# Patient Record
Sex: Female | Born: 1955 | Race: White | Hispanic: No | Smoking: Former smoker
Health system: Southern US, Community
[De-identification: ages and names within clinical notes are randomized; demographics above are authoritative.]

## PROBLEM LIST (undated history)

## (undated) DIAGNOSIS — E785 Hyperlipidemia, unspecified: Secondary | ICD-10-CM

## (undated) DIAGNOSIS — G114 Hereditary spastic paraplegia: Secondary | ICD-10-CM

## (undated) DIAGNOSIS — M1990 Unspecified osteoarthritis, unspecified site: Secondary | ICD-10-CM

## (undated) DIAGNOSIS — E119 Type 2 diabetes mellitus without complications: Secondary | ICD-10-CM

## (undated) DIAGNOSIS — O24430 Gestational diabetes mellitus in the puerperium, diet controlled: Secondary | ICD-10-CM

## (undated) DIAGNOSIS — J439 Emphysema, unspecified: Secondary | ICD-10-CM

## (undated) DIAGNOSIS — K219 Gastro-esophageal reflux disease without esophagitis: Secondary | ICD-10-CM

## (undated) HISTORY — DX: Hyperlipidemia, unspecified: E78.5

## (undated) HISTORY — PX: INNER EAR SURGERY: SHX679

## (undated) HISTORY — PX: SHOULDER SURGERY: SHX246

---

## 2011-07-13 ENCOUNTER — Ambulatory Visit: Payer: Self-pay | Admitting: Internal Medicine

## 2012-05-16 ENCOUNTER — Ambulatory Visit: Payer: Self-pay | Admitting: Emergency Medicine

## 2012-05-28 ENCOUNTER — Ambulatory Visit: Payer: Self-pay | Admitting: Internal Medicine

## 2012-06-30 ENCOUNTER — Ambulatory Visit: Payer: Self-pay | Admitting: Internal Medicine

## 2012-11-23 ENCOUNTER — Emergency Department (HOSPITAL_COMMUNITY)
Admission: EM | Admit: 2012-11-23 | Discharge: 2012-11-23 | Disposition: A | Discharge status: Home / Self Care | Payer: Medicare Other | Attending: Emergency Medicine | Admitting: Emergency Medicine

## 2012-11-23 ENCOUNTER — Emergency Department (HOSPITAL_COMMUNITY): Payer: Medicare Other

## 2012-11-23 ENCOUNTER — Encounter (HOSPITAL_COMMUNITY): Payer: Self-pay | Admitting: *Deleted

## 2012-11-23 DIAGNOSIS — I10 Essential (primary) hypertension: Secondary | ICD-10-CM | POA: Insufficient documentation

## 2012-11-23 DIAGNOSIS — E119 Type 2 diabetes mellitus without complications: Secondary | ICD-10-CM | POA: Insufficient documentation

## 2012-11-23 DIAGNOSIS — Z79899 Other long term (current) drug therapy: Secondary | ICD-10-CM | POA: Insufficient documentation

## 2012-11-23 DIAGNOSIS — R296 Repeated falls: Secondary | ICD-10-CM | POA: Insufficient documentation

## 2012-11-23 DIAGNOSIS — S20212A Contusion of left front wall of thorax, initial encounter: Secondary | ICD-10-CM

## 2012-11-23 DIAGNOSIS — S20219A Contusion of unspecified front wall of thorax, initial encounter: Secondary | ICD-10-CM | POA: Insufficient documentation

## 2012-11-23 DIAGNOSIS — Y929 Unspecified place or not applicable: Secondary | ICD-10-CM | POA: Insufficient documentation

## 2012-11-23 DIAGNOSIS — Y9389 Activity, other specified: Secondary | ICD-10-CM | POA: Insufficient documentation

## 2012-11-23 DIAGNOSIS — F17200 Nicotine dependence, unspecified, uncomplicated: Secondary | ICD-10-CM | POA: Insufficient documentation

## 2012-11-23 HISTORY — DX: Gestational diabetes mellitus in the puerperium, diet controlled: O24.430

## 2012-11-23 HISTORY — DX: Type 2 diabetes mellitus without complications: E11.9

## 2012-11-23 MED ORDER — NAPROXEN 500 MG PO TABS: 500.0000 mg | ORAL_TABLET | Freq: Two times a day (BID) | ORAL | Status: DC

## 2012-11-23 MED ORDER — OXYCODONE-ACETAMINOPHEN 5-325 MG PO TABS: 1.0000 | ORAL_TABLET | Freq: Four times a day (QID) | ORAL | Status: DC | PRN

## 2012-11-23 MED ORDER — IBUPROFEN 200 MG PO TABS
600.0000 mg | ORAL_TABLET | Freq: Once | ORAL | Status: AC
Start: 2012-11-23 — End: 2012-11-23
  Administered 2012-11-23: 600 mg via ORAL
  Filled 2012-11-23: qty 3

## 2012-11-23 NOTE — ED Notes (Signed)
PT reports falling while bringing husband to ED. Pt reports pain to the Lt rib region.

## 2012-11-23 NOTE — ED Provider Notes (Signed)
Medical screening examination/treatment/procedure(s) were performed by non-physician practitioner and as supervising physician I was immediately available for consultation/collaboration.  Avir Deruiter L Charniece Venturino, MD 11/23/12 1709 

## 2012-11-23 NOTE — ED Provider Notes (Signed)
CSN: [id number]     Arrival date & time 11/23/12  0805 History   First MD Initiated Contact with Patient 11/23/12 0813     Chief Complaint  Patient presents with  . Rib Injury   (Consider location/radiation/quality/duration/timing/severity/associated sxs/prior Treatment) HPI Comments: Patient presents with complaint of left anterior rib pain after a fall this morning. Patient states that she fell onto her left forearm and left chest. She currently only has pain in her anterior ribs. No chest pain or shortness of breath. Patient denies head injury or neck pain. No treatments prior to arrival. Onset of symptoms acute. Course is constant. Movement makes the pain worse. Nothing makes it better.  The history is provided by the patient.    Past Medical History  Diagnosis Date  . Hypertension   . Diabetes mellitus without complication   . Diet controlled gestational diabetes mellitus in puerperium    Past Surgical History  Procedure Laterality Date  . Inner ear surgery Bilateral   . Shoulder surgery Bilateral     rotator cuff   No family history on file. History  Substance Use Topics  . Smoking status: Current Every Day Smoker -- 2.00 packs/day    Types: Cigarettes  . Smokeless tobacco: Never Used  . Alcohol Use: No   OB History   Grav Para Term Preterm Abortions TAB SAB Ect Mult Living                 Review of Systems  Constitutional: Negative for fever.  HENT: Negative for neck pain.   Respiratory: Negative for cough and shortness of breath.   Cardiovascular: Positive for chest pain (chest wall pain).  Gastrointestinal: Negative for abdominal pain.  Musculoskeletal: Positive for myalgias. Negative for arthralgias.  Skin: Negative for wound.  Neurological: Negative for headaches.    Allergies  Review of patient's allergies indicates no known allergies.  Home Medications   Current Outpatient Rx  Name  Route  Sig  Dispense  Refill  . ADVAIR DISKUS 250-50 MCG/DOSE  AEPB   Inhalation   Inhale 1 puff into the lungs daily.          Marland Kitchen albuterol (PROVENTIL HFA;VENTOLIN HFA) 108 (90 BASE) MCG/ACT inhaler   Inhalation   Inhale 2 puffs into the lungs every 6 (six) hours as needed for wheezing.         . gabapentin (NEURONTIN) 100 MG capsule   Oral   Take 100 mg by mouth daily.         . Multiple Vitamin (MULTIVITAMIN WITH MINERALS) TABS tablet   Oral   Take 1 tablet by mouth daily.         Marland Kitchen omeprazole (PRILOSEC) 20 MG capsule   Oral   Take 20 mg by mouth daily.         Marland Kitchen oxyCODONE-acetaminophen (PERCOCET) 7.5-325 MG per tablet   Oral   Take 1 tablet by mouth every 4 (four) hours as needed for pain.         Marland Kitchen tiZANidine (ZANAFLEX) 4 MG tablet   Oral   Take 4 mg by mouth every 6 (six) hours as needed.           BP 142/75  Pulse 70  Temp(Src) 98 F (36.7 C) (Oral)  Resp 22  SpO2 96% Physical Exam  Nursing note and vitals reviewed. Constitutional: She appears well-developed and well-nourished.  HENT:  Head: Normocephalic and atraumatic.  Eyes: Conjunctivae are normal. Right eye exhibits no discharge. Left eye  exhibits no discharge.  Neck: Normal range of motion. Neck supple.  Cardiovascular: Normal rate, regular rhythm and normal heart sounds.   Pulmonary/Chest: Effort normal and breath sounds normal. She exhibits tenderness and bony tenderness. She exhibits no mass, no laceration, no crepitus, no edema and no swelling.    Abdominal: Soft. There is no tenderness.  Musculoskeletal:       Right elbow: Normal.      Right wrist: Normal.       Cervical back: Normal.       Thoracic back: Normal.       Right forearm: Normal.       Right hand: Normal.  Neurological: She is alert.  Skin: Skin is warm and dry.  Psychiatric: She has a normal mood and affect.    ED Course  Procedures (including critical care time) Labs Review Labs Reviewed - No data to display Imaging Review No results found.  8:44 AM Patient seen and  examined. Work-up initiated.   Vital signs reviewed and are as follows: Filed Vitals:   11/23/12 0819  BP: 142/75  Pulse: 70  Temp: 98 F (36.7 C)  Resp: 22   9:21 AM chest x-ray is negative. Patient informed. Patient states that she has 6 Percocet left at home because her last prescription was filled August. This is consistent with the substance reporting database. Will prescribe a small amount pain medicine.  Pt counseled to take 10 deep breaths every hour while awake.    Patient counseled on use of narcotic pain medications. Counseled not to combine these medications with others containing tylenol. Urged not to drink alcohol, drive, or perform any other activities that requires focus while taking these medications. The patient verbalizes understanding and agrees with the plan.    MDM   1. Rib contusion, left, initial encounter    Fall. Only apparent injury is left anterior rib pain. Chest x-ray does not demonstrate pneumothorax or broken rib. Will treat conservatively. Patient appears well. No respiratory distress.    Renne Crigler, PA-C 11/23/12 [phone]

## 2013-01-01 ENCOUNTER — Ambulatory Visit: Payer: Self-pay | Admitting: Internal Medicine

## 2013-03-22 HISTORY — PX: HIP FRACTURE SURGERY: SHX118

## 2013-03-29 ENCOUNTER — Inpatient Hospital Stay: Payer: Self-pay | Admitting: Orthopedic Surgery

## 2013-03-29 LAB — BASIC METABOLIC PANEL
ANION GAP: 5 — AB (ref 7–16)
BUN: 7 mg/dL (ref 7–18)
CO2: 25 mmol/L (ref 21–32)
CREATININE: 0.31 mg/dL — AB (ref 0.60–1.30)
Calcium, Total: 8.9 mg/dL (ref 8.5–10.1)
Chloride: 108 mmol/L — ABNORMAL HIGH (ref 98–107)
EGFR (African American): >60
Glucose: 142 mg/dL — ABNORMAL HIGH (ref 65–99)
Osmolality: 276 (ref 275–301)
POTASSIUM: 4.4 mmol/L (ref 3.5–5.1)
SODIUM: 138 mmol/L (ref 136–145)

## 2013-03-29 LAB — URINALYSIS, COMPLETE
Bilirubin,UR: NEGATIVE
Blood: NEGATIVE
GLUCOSE, UR: NEGATIVE mg/dL (ref 0–75)
Ketone: NEGATIVE
Leukocyte Esterase: NEGATIVE
Nitrite: NEGATIVE
Ph: 6 (ref 4.5–8.0)
Protein: NEGATIVE
RBC, UR: NONE SEEN /HPF (ref 0–5)
SPECIFIC GRAVITY: 1.004 (ref 1.003–1.030)

## 2013-03-29 LAB — CBC
HCT: 42.6 % (ref 35.0–47.0)
HGB: 14.5 g/dL (ref 12.0–16.0)
MCH: 32.3 pg (ref 26.0–34.0)
MCHC: 34.1 g/dL (ref 32.0–36.0)
MCV: 95 fL (ref 80–100)
PLATELETS: 209 10*3/uL (ref 150–440)
RBC: 4.49 10*6/uL (ref 3.80–5.20)
RDW: 12.9 % (ref 11.5–14.5)
WBC: 23.3 10*3/uL — AB (ref 3.6–11.0)

## 2013-03-29 LAB — APTT: Activated PTT: 31.1 secs (ref 23.6–35.9)

## 2013-03-29 LAB — PROTIME-INR
INR: 1
Prothrombin Time: 12.7 secs (ref 11.5–14.7)

## 2013-03-30 LAB — CBC WITH DIFFERENTIAL/PLATELET
BASOS PCT: 0.5 %
Basophil #: 0 10*3/uL (ref 0.0–0.1)
Eosinophil #: 0.2 10*3/uL (ref 0.0–0.7)
Eosinophil %: 2.2 %
HCT: 38.4 % (ref 35.0–47.0)
HGB: 13 g/dL (ref 12.0–16.0)
LYMPHS ABS: 3.3 10*3/uL (ref 1.0–3.6)
Lymphocyte %: 34.1 %
MCH: 32.4 pg (ref 26.0–34.0)
MCHC: 33.8 g/dL (ref 32.0–36.0)
MCV: 96 fL (ref 80–100)
MONO ABS: 0.7 x10 3/mm (ref 0.2–0.9)
MONOS PCT: 7.7 %
NEUTROS PCT: 55.5 %
Neutrophil #: 5.3 10*3/uL (ref 1.4–6.5)
Platelet: 168 10*3/uL (ref 150–440)
RBC: 4 10*6/uL (ref 3.80–5.20)
RDW: 12.9 % (ref 11.5–14.5)
WBC: 9.6 10*3/uL (ref 3.6–11.0)

## 2013-03-30 LAB — BASIC METABOLIC PANEL
ANION GAP: 4 — AB (ref 7–16)
BUN: 6 mg/dL — ABNORMAL LOW (ref 7–18)
CHLORIDE: 108 mmol/L — AB (ref 98–107)
CO2: 27 mmol/L (ref 21–32)
CREATININE: 0.82 mg/dL (ref 0.60–1.30)
Calcium, Total: 8.3 mg/dL — ABNORMAL LOW (ref 8.5–10.1)
EGFR (African American): >60
EGFR (Non-African Amer.): >60
Glucose: 216 mg/dL — ABNORMAL HIGH (ref 65–99)
OSMOLALITY: 282 (ref 275–301)
Potassium: 3.7 mmol/L (ref 3.5–5.1)
SODIUM: 139 mmol/L (ref 136–145)

## 2013-03-30 LAB — HEMOGLOBIN A1C: HEMOGLOBIN A1C: 7.6 % — AB (ref 4.2–6.3)

## 2013-03-31 LAB — CBC WITH DIFFERENTIAL/PLATELET
Basophil #: 0.1 10*3/uL (ref 0.0–0.1)
Basophil %: 0.9 %
Eosinophil #: 0.2 10*3/uL (ref 0.0–0.7)
Eosinophil %: 1.3 %
HCT: 36.4 % (ref 35.0–47.0)
HGB: 12.4 g/dL (ref 12.0–16.0)
LYMPHS PCT: 19.2 %
Lymphocyte #: 2.7 10*3/uL (ref 1.0–3.6)
MCH: 32.8 pg (ref 26.0–34.0)
MCHC: 34.2 g/dL (ref 32.0–36.0)
MCV: 96 fL (ref 80–100)
MONO ABS: 1.1 x10 3/mm — AB (ref 0.2–0.9)
Monocyte %: 8.2 %
NEUTROS PCT: 70.4 %
Neutrophil #: 9.8 10*3/uL — ABNORMAL HIGH (ref 1.4–6.5)
PLATELETS: 148 10*3/uL — AB (ref 150–440)
RBC: 3.79 10*6/uL — ABNORMAL LOW (ref 3.80–5.20)
RDW: 12.8 % (ref 11.5–14.5)
WBC: 13.9 10*3/uL — ABNORMAL HIGH (ref 3.6–11.0)

## 2013-03-31 LAB — BASIC METABOLIC PANEL
ANION GAP: 3 — AB (ref 7–16)
BUN: 5 mg/dL — ABNORMAL LOW (ref 7–18)
Calcium, Total: 8.4 mg/dL — ABNORMAL LOW (ref 8.5–10.1)
Chloride: 105 mmol/L (ref 98–107)
Co2: 27 mmol/L (ref 21–32)
Creatinine: 0.62 mg/dL (ref 0.60–1.30)
EGFR (African American): >60
Glucose: 151 mg/dL — ABNORMAL HIGH (ref 65–99)
OSMOLALITY: 270 (ref 275–301)
Potassium: 3.9 mmol/L (ref 3.5–5.1)
Sodium: 135 mmol/L — ABNORMAL LOW (ref 136–145)

## 2013-04-01 LAB — HEMOGLOBIN: HGB: 12.4 g/dL (ref 12.0–16.0)

## 2013-04-11 ENCOUNTER — Other Ambulatory Visit: Payer: Self-pay | Admitting: Family Medicine

## 2013-04-11 LAB — URINALYSIS, COMPLETE
BILIRUBIN, UR: NEGATIVE
GLUCOSE, UR: NEGATIVE mg/dL (ref 0–75)
Ketone: NEGATIVE
Nitrite: NEGATIVE
Ph: 7 (ref 4.5–8.0)
Protein: 30
Specific Gravity: 1.01 (ref 1.003–1.030)

## 2013-04-13 LAB — URINE CULTURE

## 2013-05-15 DIAGNOSIS — G114 Hereditary spastic paraplegia: Secondary | ICD-10-CM | POA: Insufficient documentation

## 2013-07-02 ENCOUNTER — Ambulatory Visit: Payer: Self-pay | Admitting: Internal Medicine

## 2013-09-28 DIAGNOSIS — M159 Polyosteoarthritis, unspecified: Secondary | ICD-10-CM | POA: Insufficient documentation

## 2014-05-10 ENCOUNTER — Ambulatory Visit: Payer: Self-pay | Admitting: Anesthesiology

## 2014-05-10 DIAGNOSIS — I10 Essential (primary) hypertension: Secondary | ICD-10-CM

## 2014-05-11 ENCOUNTER — Ambulatory Visit: Payer: Medicare HMO | Admitting: Orthopedic Surgery

## 2014-06-12 NOTE — Consult Note (Signed)
Chief Complaint:  Subjective/Chief Complaint Pt with right hip fx. Hx of COPD, GERD, chronic pain. No cardiac hx. Denies CP, palpitations, or syncope.   Brief Assessment:  GEN no acute distress   Cardiac Regular   Respiratory clear BS   Gastrointestinal details normal Soft  Nontender  Bowel sounds normal   EXTR negative edema   Lab Results: Routine Chem:  08-Feb-15 15:59   Glucose, Serum  142  BUN 7  Creatinine (comp)  0.31  Sodium, Serum 138  Potassium, Serum 4.4  Chloride, Serum  108  CO2, Serum 25  Anion Gap  5  eGFR (Non-African American) >60 (eGFR values <66m/min/1.73 m2 may be an indication of chronic kidney disease (CKD). Calculated eGFR is useful in patients with stable renal function. The eGFR calculation will not be reliable in acutely ill patients when serum creatinine is changing rapidly. It is not useful in  patients on dialysis. The eGFR calculation may not be applicable to patients at the low and high extremes of body sizes, pregnant women, and vegetarians.)  Routine Hem:  08-Feb-15 15:59   WBC (CBC)  23.3  Hemoglobin (CBC) 14.5  Platelet Count (CBC) 209 (Result(s) reported on 29 Mar 2013 at 04:16PM.)   Radiology Results: XRay:    08-Feb-15 16:24, Chest 1 View AP or PA  Chest 1 View AP or PA   REASON FOR EXAM:    FALL, PAIN  COMMENTS:       PROCEDURE: DXR - DXR CHEST 1 VIEWAP OR PA  - Mar 29 2013  4:24PM     CLINICAL DATA:  Fall today with hip pain.    EXAM:  CHEST - 1 VIEW    COMPARISON:  None.    FINDINGS:  The heart size and mediastinalcontours are normal. The lungs are  clear. There is no pleural effusion or pneumothorax. No fractures  are identified. There are postsurgical changes within the right  humeral head, distal right clavicle and left scapular glenoid.     IMPRESSION:  Noactive cardiopulmonary process.      Electronically Signed    By: BCamie PatienceM.D.    On: 03/29/2013 16:26         Verified By: WVivia Ewing  M.D.,    08-Feb-15 16:24, Hip Right Complete  Hip Right Complete   REASON FOR EXAM:    Fall, Pain, limited ROM  COMMENTS:       PROCEDURE: DXR - DXR HIP RIGHT COMPLETE  - Mar 29 2013  4:24PM     CLINICAL DATA:  Fall today with right hip pain and limited range of  motion.    EXAM:  RIGHT HIP - COMPLETE 2+ VIEW    COMPARISON:  None.    FINDINGS:  The bones appear mildly demineralized. There is a nondisplaced  fracture of the proximal right femur involving the intertrochanteric  region and proximal diaphysis. There is underlying deformity of the  right femoral neckwith mild asymmetric right hip osteoarthritis. No  pelvic fractures are identified. Postsurgical changes are present  within the superior pubic rami bilaterally.     IMPRESSION:  Nondisplaced right intertrochanteric femur fracture with  subtrochanteric extension. Underlying deformity of the right femoral  neck.      Electronically Signed    By: BCamie PatienceM.D.    On: 03/29/2013 16:25     Verified By: WVivia Ewing M.D.,   Assessment/Plan:  Assessment/Plan:  Plan EKG normal. Cleared for surgery. Will follow.   Electronic  Signatures: Idelle Crouch (MD)  (Signed 08-Feb-15 16:49)  Authored: Chief Complaint, Brief Assessment, Lab Results, Radiology Results, Assessment/Plan   Last Updated: 08-Feb-15 16:49 by Idelle Crouch (MD)

## 2014-06-12 NOTE — Discharge Summary (Signed)
Dates of Admission and Diagnosis:  Date of Admission 29-Mar-2013   Date of Discharge 03-Apr-2013   Admitting Diagnosis intertrochanteric hip fracture   Final Diagnosis same   Allergies:  No Known Allergies:   Hospital Course:  Hospital Course Patient status unchanged since discharge summary of 2/11.  Tolerated PT  and diet well. No change in medication. Stable for transfer to rehab facility.   DISCHARGE INSTRUCTIONS HOME MEDS:  Medication Reconciliation: Patient's Home Medications at Discharge:     Medication Instructions  gabapentin 100 mg oral capsule   orally once a day   omeprazole 10 mg oral delayed release capsule  1 cap(s) orally once a day   advair diskus 250 mcg-50 mcg inhalation powder  1 puff(s) inhaled 2 times a day   acetaminophen 325 mg oral tablet  2 tab(s) orally every 4 hours, As needed, pain or temp. greater than 100.4   lovenox 40 mg/0.4 ml injectable solution   injectable once a day   ferrous sulfate 325 mg (65 mg elemental iron) oral tablet  1 tab(s) orally 2 times a day (with meals)   bisacodyl 10 mg rectal suppository  1 suppository(ies) rectal once a day (at bedtime), As needed, constipation   docusate calcium 240 mg oral capsule  1 cap(s) orally once a day (at bedtime)   glipizide 5 mg oral tablet  1 tab(s) orally 2 times a day (before meals)    STOP TAKING THE FOLLOWING MEDICATION(S):    oxycodone 5 mg oral tablet: 1 tab(s) orally 2 times a day  Electronic Signatures: Laurene Footman (MD)  (Signed 13-Feb-15 18:45)  Authored: [facility], [facility]   Last Updated: 13-Feb-15 18:45 by Laurene Footman (MD)

## 2014-06-12 NOTE — H&P (Signed)
PATIENT NAME:  Hannah [name], Hannah [name] MR#:  [id number] DATE OF BIRTH:  09/07/1955  DATE OF ADMISSION:  03/29/2013  CHIEF COMPLAINT: Right hip pain.   HISTORY OF PRESENT ILLNESS: The patient is a 59 year old who was going up some steps at home, and her dog pulled on her chain, knocking the patient down. She suffered immediate pain, and was unable to bear weight. She was brought to the ER, where she was found to have a right intertrochanteric hip fracture. She normally does have some problems ambulating secondary to a neurologic disorder that is rare. She normally uses a walker. She does have a past medical history significant for COPD, reflux, and chronic pain.  MEDICATIONS UPON ADMISSION:  Advair Diskus 25/50, 1 puff b.i.d., gabapentin 100 mg daily at night, omeprazole 10 mg daily, oxycodone 5, 1 pill b.i.d.   SOCIAL HISTORY: She is a smoker.    FAMILY HISTORY:  Noncontributory.  REVIEW OF SYSTEMS: Negative for chest pain, loss of consciousness.   PHYSICAL EXAMINATION: GENERAL: A well-developed, well-nourished white female who appears her stated age, in moderate distress.  HEENT:  Remarkable for lower partial denture.  LUNGS: Clear. No wheezing noted.  HEART: Regular rate and rhythm.  ABDOMEN: Soft and nontender. EXTREMITIES: Right lower extremity, she is able to flex and extend the toes, and does have palpable pulses and sensation in the foot. It is difficult to assess rotation, as the patient lying on the left side.   DATA:  X-rays were reviewed with the patient and her family, showing a spiral proximal intertrochanteric fracture with subtrochanteric extension.   CLINICAL IMPRESSION: Right intertrochanteric hip fracture with subtrochanteric extension. Recommended treatment is ORIF with intramedullary device. I reviewed this with the patient. The risks, benefits, possible complications, in particular blood clots and infection, were discussed. She was also seen by the hospitalist service.   PLAN:   ORIF tomorrow afternoon.   ____________________________ Laurene Footman, MD mjm:mr D: 03/29/2013 18:57:48 ET T: 03/29/2013 20:31:17 ET JOB#: [id number]  cc: Laurene Footman, MD, <Dictator> Laurene Footman MD ELECTRONICALLY SIGNED 03/30/2013 8:21

## 2014-06-12 NOTE — Op Note (Signed)
PATIENT NAME:  Hannah [name], Hannah [name] MR#:  [id number] DATE OF BIRTH:  [birth year]  DATE OF PROCEDURE:  03/30/2013  PREOPERATIVE DIAGNOSIS: Right intertrochanteric hip fracture with subtrochanteric extension.   POSTOPERATIVE DIAGNOSIS: Right intertrochanteric hip fracture with subtrochanteric extension.    PROCEDURE: Open reduction and internal fixation, right hip, with intramedullary device.   ANESTHESIA: Spinal.   SURGEON: Laurene Footman, M.D.   DESCRIPTION OF PROCEDURE: The patient was brought to the operating room and after adequate anesthesia was obtained, the patient was placed on the fracture table with the left leg in the well leg holder, right leg in the traction boot. With slight traction and internal rotation applied, the fracture was well aligned. The hip was prepped and draped using the barrier drape method, and appropriate patient identification and timeout procedures were completed. A proximal skin incision was made proximal to the greater trochanter and a guidewire inserted through the center of the greater trochanter. Proximal reaming carried out and the long guidewire inserted. Measurement was made off of this for length. Reaming to 11 mm gave good chatter in the mid canal, so a 9 x 340 mm long Affixus rod was obtained for the right side, 130 degree. This was inserted to the appropriate level and impacted to the appropriate level with a small lateral incision. A guidewire was inserted in a near center/center position. Because of the fracture vertical obliquity, a second guidewire was taken and placed as an antirotation device through the antirotation guide but without the antirotation screw being utilized and left until lag screw inserted. The guidewire measured 90 mm for the lag screw. Drilling was carried out, followed by placement of the 90 mm lag screw with a very firm bite into the femoral head and the superior guidewire to prevent rotation of the fragment. The antirotation guidewire was  removed and the set screw placed. Following this, traction was released, and compression was applied to the fracture site with good compression at the fracture site. Permanent AP and lateral projections were saved after removal of the insertion device. The leg was abducted, and a distal interlocking screw was placed using standard technique, visualizing the slotted screw hole. A small stab incision, spreading the soft tissues, drilling, measuring and placing a 5.0 cortical screw, 38 mm in length. This gave good fixation on both cortices. At this point, the wounds were thoroughly irrigated. The deep fascia repaired using 0 Vicryl, 2-0 Vicryl subcutaneously and skin staples. Xeroform, 4 x 4's, ABD and tape applied. The patient was then sent to the recovery room in stable condition.   ESTIMATED BLOOD LOSS: 50.   COMPLICATIONS: None.   SPECIMEN: None.   IMPLANTS: Biomet Affixus hip fracture nail right, 130 degree, 9 x 340 mm, with a 90 mm lag screw and a 38 mm cortical bone screw.    ____________________________ Laurene Footman, MD mjm:gb D: 03/30/2013 18:32:36 ET T: 03/31/2013 00:44:13 ET JOB#: [id number]  cc: Laurene Footman, MD, <Dictator> Laurene Footman MD ELECTRONICALLY SIGNED 03/31/2013 11:35

## 2014-06-12 NOTE — Consult Note (Signed)
PATIENT NAME:  Hannah [name], Hannah [name] MR#:  [id number] DATE OF BIRTH:  18-Sep-1955  DATE OF CONSULTATION:  03/29/2013  REFERRING PHYSICIAN:  Laurene Footman, MD CONSULTING PHYSICIAN:  Leonie Douglas. Doy Hutching, MD  FAMILY PHYSICIAN: Halina Maidens, MD  REASON FOR CONSULTATION:  Preop medical clearance prior to hip surgery.   HISTORY OF PRESENT ILLNESS: The patient is a 59 year old female with a history of COPD/tobacco abuse, reflux and chronic back pain with a questionable history of spina bifida. Ambulates with assistance on a normal day. Tripped over the dog this morning, falling, injuring her right hip. Was found to have a hip fracture and was admitted by orthopedics for further evaluation. Denies any cardiac history. Denies chest pain, palpitations, presyncope or syncope.   PAST MEDICAL HISTORY: 1.  Chronic back pain with narcotics dependence.  2.  GE reflux disease.  3.  COPD/tobacco abuse.  4.  Questionable history of spina bifida.   MEDICATIONS: 1.  Oxycodone 5 mg p.o. b.i.d.  2.  Prilosec 20 mg p.o. daily.  3.  Neurontin 100 mg p.o. daily.  4.  Advair 250/50 one puff b.i.d.   ALLERGIES: No known drug allergies.   SOCIAL HISTORY: The patient smokes less than 1 pack per day. Denies alcohol abuse.   FAMILY HISTORY: Positive for hypertension but negative for colon or breast cancer. Negative for coronary artery disease.   REVIEW OF SYSTEMS: CONSTITUTIONAL: No fever or change in weight.  EYES: No blurred or double vision. No glaucoma.  ENT: No tinnitus or hearing loss. No nasal discharge or bleeding. No difficulty swallowing.  RESPIRATORY: No cough or wheezing. Denies hemoptysis.  CARDIOVASCULAR: No chest pain or orthopnea. No palpitations or syncope.  GASTROINTESTINAL: No nausea, vomiting or diarrhea. No abdominal pain or change in bowel habits.  GENITOURINARY: No dysuria or hematuria. No incontinence.  ENDOCRINE: No polyuria or polydipsia. No heat or cold intolerance.  HEMATOLOGIC: The  patient denies anemia, easy bruising or bleeding.  LYMPHATIC: No swollen glands.  MUSCULOSKELETAL: The patient denies pain in her neck, shoulders or knees. Does have back and hip pain. No gout.  NEUROLOGIC: No numbness or migraines. Denies stroke or seizures.  PSYCHIATRIC: The patient denies anxiety, insomnia or depression.   PHYSICAL EXAMINATION: GENERAL: The patient is acutely ill-appearing due to her hip pain.  VITAL SIGNS: Remarkable for a blood pressure of 116/72, heart rate 61, respiratory rate of 16, temperature of 98. Sats 94% on room air.  HEENT: Normocephalic, atraumatic. Pupils equally round and reactive to light and accommodation. Extraocular movements are intact. Sclerae anicteric. Conjunctivae are clear. Oropharynx clear. NECK: Supple without JVD. No adenopathy or thyromegaly is noted.  LUNGS: Clear to auscultation and percussion without wheezes, rales or rhonchi. No dullness. Respiratory effort is normal.  CARDIAC: Regular rate and rhythm. Normal S1, S2. No significant rubs, murmurs or gallops. PMI is nondisplaced. Chest wall is nontender.  ABDOMEN: Soft, nontender, with normoactive bowel sounds. No organomegaly or masses were appreciated. No hernias or bruits were noted.  EXTREMITIES: Without clubbing, cyanosis or edema. Pulses were 2+ bilaterally.  SKIN: Warm and dry without rash or lesions.  NEUROLOGIC: Cranial nerves II through XII grossly intact. Deep tendon reflexes were symmetric. Motor and sensory examination was nonfocal.  PSYCHIATRIC: Revealed a patient who was alert and oriented to person, place and time. She was cooperative and used good judgment.   LABORATORY, DIAGNOSTIC AND RADIOLOGICAL DATA:  EKG revealed sinus rhythm with no acute ischemic changes. Chest x-ray showed no acute cardiopulmonary disease. White  count was 23.3 with a hemoglobin of 14.5. Glucose 142 with a BUN of 7, creatinine of 0.31 with a sodium of 138 and a potassium of 4.4. GFR was greater than 60.    ASSESSMENT: 1.  Acute right hip fracture.  2.  Chronic obstructive pulmonary disease.  3.  Gastroesophageal reflux disease. 4.  Chronic back pain.  5.  Hyperglycemia.   PLAN: The patient appears medically stable and is cleared for orthopedic surgery. We will optimize her pulmonary regimen. She defers a nicotine patch. We will follow her sugars with Accu-Cheks before meals and at bedtime and add sliding scale insulin as needed. Begin IV fluids now. Follow up routine labs in the morning.   Thank you for the consultation. Will continue to follow this patient with you while in the hospital. Please call if questions arise.   ____________________________ Leonie Douglas. Doy Hutching, MD jds:cs D: 03/29/2013 16:59:15 ET T: 03/29/2013 20:55:21 ET JOB#: [id number]  cc: Leonie Douglas. Doy Hutching, MD, <Dictator> Lache Dagher Lennice Sites MD ELECTRONICALLY SIGNED 03/30/2013 7:48

## 2014-06-12 NOTE — Discharge Summary (Signed)
PATIENT NAME:  Hannah [name], BOYSON MR#:  [id number] DATE OF BIRTH:  1955/06/06  DATE OF ADMISSION:  03/29/2013 DATE OF DISCHARGE:  04/02/2013  ADMITTING DIAGNOSIS: Right intertrochanteric hip fracture with subtrochanteric extension.   POSTOPERATIVE DIAGNOSIS: Right intertrochanteric fracture with subtrochanteric extension.   PROCEDURE: Open reduction and internal fixation of the right hip with intramedullary device.   ANESTHESIA: Spinal.   SURGEON: Laurene Footman, M.D.   ESTIMATED BLOOD LOSS: 50 mL.   COMPLICATIONS: None.   SPECIMENS: None.   IMPLANTS: [employer] Affixus hip fracture nail of right, 130 degrees, 9 x 340 mm, with a 90 mm lag screw and a 38 mm cortical bone screw.   HISTORY: The patient is a 59 year old female, who was going up some steps at home and her dog pulled her on a chain knocking the patient down and she suffered immediate pain and was unable to bear weight. She was brought to the ER where she was found to have a right intertrochanteric hip fracture. She normally does have some problems with ambulating secondary to a neurologic disorder that rare. She normally uses a walker. She does have a past medical history significant for chronic obstructive pulmonary disease, reflux and chronic pain.   PHYSICAL EXAMINATION:  GENERAL: Well-developed, well-nourished, white female, who appears her stated age, in moderate distress.  HEENT: Remarkable for lower partial dentures.  LUNGS: Clear with no wheezing noted.  HEART: Regular rate and rhythm.  ABDOMEN: Soft, nontender.  EXTREMITIES: Right lower extremity. She is able to flex and extend the toes and does have palpable pulses and sensation in the foot. It is difficult to assess rotation as the patient is lying on the left side.   HOSPITAL COURSE: The patient was admitted to the hospital in February 2015 where she was evaluated by medicine service and she was cleared for surgery. On 03/30/2013, she had surgery that day or open  reduction internal fixation of right hip with intramedullary device. She was brought to the orthopedic floor from the PACU in stable condition. The patient was in moderate pain throughout the first 2 days. She had slow progress with physical therapy. It was determined that she would go to rehab for continuation of physical therapy and occupational therapy service. Her vital signs and labs were stable. She had a bowel movement, and by postoperative day 3, April 02, 2013, she was ready for discharge to rehab.   DISCHARGE INSTRUCTIONS: The patient may gradually increase weight-bearing on the effected extremity. She is to elevate the affected foot or leg on 1 or 2 pillows with the foot higher than the knee. Knee-high TED hose on both legs and remove at bedtime, replace on arising the next morning. Elevate the heels off the bed. Use incentive spirometry every hour while awake. She may resume a regular diet as tolerated. Do not get the dressing or bandage wet or dirty. Call [facility] ortho if the dressing gets water under. Leave the dressing on. Call [facility] ortho if any bright red bleeding from the incision wound, fever above 101.5 degrees, redness, swelling, or drainage at the incision. Call [facility] ortho if you experience any increased leg pain, numbness or weakness in legs or bowel or bladder symptoms. Rehab has been arranged for continuation of rehab program. Please call [place] [facility] ortho if a therapist has not contacted you within 48 hours of return home. Call [facility] ortho for followup appointment.   DISCHARGE MEDICATIONS: Please see [facility] ortho discharge instructions for her discharge medications.  ____________________________ T. Gerald Stabs  Arvella Nigh, PA-C tcg:aw D: 04/02/2013 08:01:09 ET T: 04/02/2013 08:14:17 ET JOB#: [id number]  cc: T. Rachelle Hora, PA-C, <Dictator> Duanne Guess [place] ELECTRONICALLY SIGNED 04/02/2013 11:23

## 2014-06-20 NOTE — Op Note (Signed)
PATIENT NAME:  Hannah [name], Hannah [name] MR#:  [id number] DATE OF BIRTH:  Jun 16, 1955  DATE OF PROCEDURE:  05/11/2014  PREOPERATIVE DIAGNOSIS: Painful hardware, right hip.   POSTOPERATIVE DIAGNOSIS: Painful hardware, right hip.   PROCEDURE: Hardware removal, right hip.   ANESTHESIA: General.   SURGEON: Laurene Footman, MD  DESCRIPTION OF PROCEDURE: The patient was brought to the operating room and after adequate anesthesia was obtained, the patient was placed on the fracture table with the right foot in the traction boot without traction applied.  The C-arm was brought in and with internal rotation, the maximum extent of the lag screw could be visualized. The hip was prepped and draped using the barrier drape method and prior incisions were utilized. After patient identification and timeout procedure were complete, proximal incision was carried out and the soft tissues spread.  The top of the rod was exposed percutaneously and the screwdriver inserted with some difficulty with soft tissues but using the insertion guide, it helped align the proximal screw. The screw could be loosened going laterally.   The prior lateral incision was made and a guidewire inserted up into the head and the prior screw removed without difficulty.   The prior screw was 90 and was somewhat prominent so a 75 mm lag screw was reinserted to prevent neck fracture. This was tightened down until it was stable.   All instruments for insertion were removed with the new lag screw locked in place. The wounds were irrigated and closed with 2-0 Vicryl subcutaneously and skin staples. Xeroform, 4 x 4's, ABD, and tape applied.   ESTIMATED BLOOD LOSS:  25.   COMPLICATIONS: None.   SPECIMEN: None.   Prior hardware was discarded.     ____________________________ Laurene Footman, MD mjm:tr D: 05/11/2014 17:12:00 ET T: 05/11/2014 17:49:12 ET JOB#: [id number]  cc: Laurene Footman, MD, <Dictator> Laurene Footman MD ELECTRONICALLY SIGNED  05/12/2014 8:08

## 2014-11-11 ENCOUNTER — Other Ambulatory Visit: Payer: Self-pay | Admitting: Internal Medicine

## 2014-11-11 ENCOUNTER — Encounter: Payer: Self-pay | Admitting: Internal Medicine

## 2014-11-11 DIAGNOSIS — R1310 Dysphagia, unspecified: Secondary | ICD-10-CM | POA: Insufficient documentation

## 2014-11-11 DIAGNOSIS — J439 Emphysema, unspecified: Secondary | ICD-10-CM | POA: Insufficient documentation

## 2014-11-11 DIAGNOSIS — F17201 Nicotine dependence, unspecified, in remission: Secondary | ICD-10-CM | POA: Insufficient documentation

## 2014-11-11 DIAGNOSIS — E118 Type 2 diabetes mellitus with unspecified complications: Secondary | ICD-10-CM | POA: Insufficient documentation

## 2014-11-11 DIAGNOSIS — G114 Hereditary spastic paraplegia: Secondary | ICD-10-CM | POA: Insufficient documentation

## 2014-11-15 ENCOUNTER — Other Ambulatory Visit: Payer: Self-pay | Admitting: Internal Medicine

## 2014-11-23 DIAGNOSIS — R69 Illness, unspecified: Secondary | ICD-10-CM | POA: Diagnosis not present

## 2014-11-24 ENCOUNTER — Encounter: Payer: Self-pay | Admitting: Internal Medicine

## 2014-11-24 ENCOUNTER — Ambulatory Visit (INDEPENDENT_AMBULATORY_CARE_PROVIDER_SITE_OTHER): Payer: Medicare HMO | Admitting: Internal Medicine

## 2014-11-24 VITALS — BP 120/60 | HR 64 | Ht 65.0 in | Wt 174.0 lb

## 2014-11-24 DIAGNOSIS — Z0000 Encounter for general adult medical examination without abnormal findings: Secondary | ICD-10-CM | POA: Diagnosis not present

## 2014-11-24 DIAGNOSIS — Z1239 Encounter for other screening for malignant neoplasm of breast: Secondary | ICD-10-CM | POA: Diagnosis not present

## 2014-11-24 DIAGNOSIS — E1100 Type 2 diabetes mellitus with hyperosmolarity without nonketotic hyperglycemic-hyperosmolar coma (NKHHC): Secondary | ICD-10-CM | POA: Diagnosis not present

## 2014-11-24 DIAGNOSIS — F17201 Nicotine dependence, unspecified, in remission: Secondary | ICD-10-CM

## 2014-11-24 DIAGNOSIS — G114 Hereditary spastic paraplegia: Secondary | ICD-10-CM

## 2014-11-24 DIAGNOSIS — Z720 Tobacco use: Secondary | ICD-10-CM | POA: Diagnosis not present

## 2014-11-24 DIAGNOSIS — J439 Emphysema, unspecified: Secondary | ICD-10-CM

## 2014-11-24 DIAGNOSIS — Z23 Encounter for immunization: Secondary | ICD-10-CM | POA: Diagnosis not present

## 2014-11-24 LAB — POCT URINALYSIS DIPSTICK
Bilirubin, UA: NEGATIVE
Blood, UA: NEGATIVE
Glucose, UA: NEGATIVE
Ketones, UA: NEGATIVE
LEUKOCYTES UA: NEGATIVE
Nitrite, UA: NEGATIVE
PH UA: 5
PROTEIN UA: NEGATIVE
Spec Grav, UA: 1.01
Urobilinogen, UA: 0.2

## 2014-11-24 NOTE — Progress Notes (Signed)
Patient: Hannah [name], Female    DOB: [birth year], 59 y.o.   MRN: [id number] Visit Date: 11/24/2014  Today's Provider: Halina Maidens, MD   Chief Complaint  Patient presents with  . Medicare Wellness   Subjective:    Annual wellness visit Hannah [name] is a 59 y.o. female who presents today for her Subsequent Annual Wellness Visit. She feels fairly well. She reports exercising none. She reports she is sleeping fairly well.   ----------------------------------------------------------- Diabetes She presents for her follow-up diabetic visit. She has type 2 diabetes mellitus. Her disease course has been fluctuating. Hypoglycemia symptoms include tremors (spasticity). Pertinent negatives for hypoglycemia include no seizures or speech difficulty. Associated symptoms include weakness. Pertinent negatives for diabetes include no chest pain, no polyphagia and no polyuria. Pertinent negatives for diabetic complications include no autonomic neuropathy, nephropathy or peripheral neuropathy. Current diabetic treatment includes oral agent (monotherapy). She is compliant with treatment most of the time. There is no change (Although she recently started drinking cinnamon smoothly and has had some decrease in blood sugar trend) in her home blood glucose trend.   COPD -patient has remained tobacco free. She has mild COPD symptoms which she takes Advair daily. She uses albuterol rescue inhaler intermittently. She denies chronic cough wheezing fever chills or change and endurance. 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 Spastic paraparesis she is [facility]. She remains on muscle relaxers and narcotic pain medications. She states that her symptom course is stable. She walks with a walker. She has frequent falls but no recent injury. She did sustain a hip fracture February 2015.   Review of Systems  Constitutional: Negative for fever, activity  change, appetite change and unexpected weight change.  HENT: Negative for hearing loss, sinus pressure, sore throat and trouble swallowing.   Eyes: Negative for visual disturbance.  Respiratory: Positive for shortness of breath. Negative for cough, choking and wheezing.   Cardiovascular: Negative for chest pain, palpitations and leg swelling.  Gastrointestinal: Negative for nausea, abdominal pain, diarrhea and blood in stool.  Endocrine: Negative for polyphagia and polyuria.  Genitourinary: Negative for dysuria, hematuria, flank pain and pelvic pain.  Musculoskeletal: Positive for myalgias, arthralgias and gait problem.  Neurological: Positive for tremors (spasticity) and weakness. Negative for seizures, speech difficulty and numbness.  Hematological: Negative for adenopathy.  Psychiatric/Behavioral: Negative for suicidal ideas, sleep disturbance, dysphoric mood and decreased concentration.    Social History   Social History  . Marital Status: Married    Spouse Name: N/A  . Number of Children: N/A  . Years of Education: N/A   Occupational History  . Not on file.   Social History Main Topics  . Smoking status: Former Smoker -- 2.00 packs/day    Types: Cigarettes  . Smokeless tobacco: Never Used  . Alcohol Use: 1.2 oz/week    2 Standard drinks or equivalent per week  . Drug Use: No  . Sexual Activity: No   Other Topics Concern  . Not on file   Social History Narrative    Patient Active Problem List   Diagnosis Date Noted  . Dysphagia 11/11/2014  . 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 Diabetes mellitus type 2, uncontrolled ([place]) 11/11/2014  . Chronic obstructive pulmonary emphysema ([place]) 11/11/2014  . Tobacco use disorder, mild, 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  . Degenerative joint disease involving multiple joints 09/28/2013    Past Surgical History  Procedure Laterality Date  . [place]  ear surgery Bilateral   . Shoulder surgery  Bilateral     rotator cuff  . Hip fracture surgery Right 03/2013    Her family history is not on file.    Previous Medications   ALBUTEROL (PROAIR HFA) 108 (90 BASE) MCG/ACT INHALER    Inhale into the lungs.   DENOSUMAB (PROLIA) 60 MG/ML SOLN INJECTION    Inject into the skin.   FLUTICASONE-SALMETEROL (ADVAIR DISKUS) 250-50 MCG/DOSE AEPB    Inhale 1 puff into the lungs 2 (two) times daily.   GABAPENTIN (NEURONTIN) 300 MG CAPSULE    Take 2 capsules by mouth 2 (two) times daily.   GLUCOSE BLOOD (BAYER CONTOUR TEST) TEST STRIP    BAYER CONTOUR TEST (In Vitro Strip)  1 (one) Strip daily for 50 days  Quantity: 50;  Refills: 5   Ordered :[id number]  Halina Maidens M.D.;  Started 22-Jun-2013 Active Comments: dx: 250.02   IBUPROFEN (ADVIL,MOTRIN) 200 MG TABLET    Take by mouth.   METFORMIN (GLUCOPHAGE) 500 MG TABLET    TAKE 1 TABLET BY MOUTH DAILY   MISC NATURAL PRODUCTS (GLUCOSAMINE CHOND COMPLEX/MSM) TABS    Take by mouth.   MULTIPLE VITAMIN (MULTIVITAMIN WITH MINERALS) TABS TABLET    Take 1 tablet by mouth daily.   OMEPRAZOLE (PRILOSEC) 20 MG CAPSULE    Take 20 mg by mouth daily.   OXYCODONE-ACETAMINOPHEN (PERCOCET/ROXICET) 5-325 MG PER TABLET    Take 1-2 tablets by mouth every 6 (six) hours as needed for pain.   TIZANIDINE (ZANAFLEX) 4 MG TABLET    Take 4 mg by mouth every 6 (six) hours as needed.     Patient Care Team: Glean Hess, MD as PCP - General (Family Medicine) Carlos Levering, MD as Referring Physician (Physical Medicine and Rehabilitation)     Objective:   Vitals: BP 120/60 mmHg  Pulse 64  Ht 5\' 5"  (1.651 m)  Wt 174 lb (78.926 kg)  BMI 28.96 kg/m2  Physical Exam  Constitutional: She is oriented to person, place, and time. She appears well-developed and well-nourished. No distress.  HENT:  Head: Normocephalic and atraumatic.  Right Ear: Tympanic membrane and ear canal normal.  Left Ear: Tympanic membrane and ear canal normal.  Nose: Right sinus exhibits no  maxillary sinus tenderness. Left sinus exhibits no maxillary sinus tenderness.  Mouth/Throat: Uvula is midline and oropharynx is clear and moist.  Eyes: Conjunctivae and EOM are normal. Right eye exhibits no discharge. Left eye exhibits no discharge. No scleral icterus.  Neck: Normal range of motion. Carotid bruit is not present. No erythema present. No thyromegaly present.  Cardiovascular: Normal rate, regular rhythm, normal heart sounds and normal pulses.   Pulmonary/Chest: Effort normal and breath sounds normal. No respiratory distress. She has no wheezes. Right breast exhibits no mass, no nipple discharge, no skin change and no tenderness. Left breast exhibits no mass, no nipple discharge, no skin change and no tenderness.  Abdominal: Soft. Bowel sounds are normal. There is no hepatosplenomegaly. There is no tenderness. There is no rebound and no CVA tenderness.  Musculoskeletal: Normal range of motion.  Abnormal gait secondary to spastic paraparesis. Patient uses a rolling walker.  Lymphadenopathy:    She has no cervical adenopathy.    She has no axillary adenopathy.  Neurological: She is alert and oriented to person, place, and time. She has normal reflexes. No cranial nerve deficit or sensory deficit.  Skin: Skin is warm, dry and intact. No rash noted.  Psychiatric: She has  a normal mood and affect. Her speech is normal and behavior is normal. Thought content normal. Cognition and memory are normal.  Nursing note and vitals reviewed.   Activities of Daily Living In your present state of health, do you have any difficulty performing the following activities: 11/24/2014  Hearing? N  Vision? N  Difficulty concentrating or making decisions? N  Walking or climbing stairs? Y  Dressing or bathing? Y  Doing errands, shopping? N    Fall Risk Assessment Fall Risk  11/24/2014  Falls in the past year? Yes  Risk for fall due to : History of fall(s);Impaired balance/gait;Impaired mobility      Patient reports there are safety devices in place in shower at home.   Depression Screen PHQ 2/9 Scores 11/24/2014  PHQ - 2 Score 0    Cognitive Testing - 6-CIT   Correct? Score   What year is it? yes 0 Yes = 0    No = 4  What month is it? yes 0 Yes = 0    No = 3  Remember:     Pia [address]au, [place], [place]     What time is it? yes 0 Yes = 0    No = 3  Count backwards from 20 to 1 yes 0 Correct = 0    1 error = 2   More than 1 error = 4  Say the months of the year in reverse. yes 0 Correct = 0    1 error = 2   More than 1 error = 4  What address did I ask you to remember? yes 0 Correct = 0  1 error = 2    2 error = 4    3 error = 6    4 error = 8    All wrong = 10       TOTAL SCORE  28/28   Interpretation:  Normal  Normal (0-7) Abnormal (8-28)        Assessment & Plan:     Annual Wellness Visit  Reviewed patient's Family Medical History Reviewed and updated list of patient's medical providers Assessment of cognitive impairment was done Assessed patient's functional ability Established a written schedule for health screening [place] Completed and Reviewed  Exercise Activities and Dietary recommendations Goals    None      Immunization History  Administered Date(s) Administered  . Influenza,inj,Quad PF,36+ Mos 11/24/2014  . Pneumococcal Conjugate-13 11/24/2014  . Pneumococcal Polysaccharide-23 05/20/2012    Health Maintenance  Topic Date Due  . Hepatitis C Screening  23-Mar-1955  . FOOT EXAM  08/05/1965  . OPHTHALMOLOGY EXAM  08/05/1965  . URINE MICROALBUMIN  08/05/1965  . HIV Screening  08/06/1970  . TETANUS/TDAP  08/06/1974  . HEMOGLOBIN A1C  09/27/2013  . COLONOSCOPY  02/21/2015  . PAP SMEAR  05/21/2015  . MAMMOGRAM  07/03/2015  . INFLUENZA VACCINE  09/20/2015  . PNEUMOCOCCAL POLYSACCHARIDE VACCINE (2) 05/20/2017     Discussed health benefits of physical activity, and encouraged her to engage in regular exercise  appropriate for her age and condition.    ------------------------------------------------------------------------------------------------------------ 1. Medicare annual wellness visit, subsequent Medicare annual wellness measures satisfied Patient given information about advanced directives  2. Uncontrolled type 2 diabetes mellitus with hyperosmolarity without coma, without long-term current use of insulin ([place]) Continue oral agents Will advise on medication adjustment if needed  annual eye exam recommended - Microalbumin / creatinine urine ratio - Comprehensive metabolic panel -  Hemoglobin A1c - Lipid panel - TSH  3. 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 Flu vaccine need - Flu Vaccine QUAD 36+ mos PF IM (Fluarix & Fluzone Quad PF)  5. Need for pneumococcal vaccination - Pneumococcal conjugate vaccine 13-valent  6. Tobacco use disorder, mild, in sustained remission Patient remains tobacco free  7. Pulmonary emphysema, unspecified emphysema type ([facility]) Stable mild symptoms on Advair and when necessary albuterol - CBC with Differential/Platelet  8. Breast cancer screening She has history of left breast density not noted on today's exam She'll schedule annual mammogram and then follow-up as recommended - MM DIGITAL SCREENING BILATERAL; Future   Halina Maidens, MD [facility]  11/24/2014

## 2014-11-24 NOTE — Patient Instructions (Addendum)
Health Maintenance  Topic Date Due  . Hepatitis C Screening  1955-12-28  . FOOT EXAM  today  . OPHTHALMOLOGY EXAM  Please schedule eye exam  . URINE MICROALBUMIN  today  . HIV Screening  08/06/1970  . TETANUS/TDAP  08/06/1974  . HEMOGLOBIN A1C  today  . INFLUENZA VACCINE  today  . COLONOSCOPY  02/21/2015  . PAP SMEAR  05/21/2015  . MAMMOGRAM  Due now  . PNEUMOCOCCAL POLYSACCHARIDE VACCINE (2) today   Pneumococcal Conjugate Vaccine (PCV13)  1. Why get vaccinated? Vaccination can protect both children and adults from pneumococcal disease. Pneumococcal disease is caused by bacteria that can spread from person to person through close contact. It can cause ear infections, and it can also lead to more serious infections of the:  Lungs (pneumonia),  Blood (bacteremia), and  Covering of the brain and spinal cord (meningitis). Pneumococcal pneumonia is most common among adults. Pneumococcal meningitis can cause deafness and brain damage, and it kills about 1 child in 10 who get it. Anyone can get pneumococcal disease, but children under 66 years of age and adults 30 years and older, people with certain medical conditions, and cigarette smokers are at the highest risk. Before there was a vaccine, the [place] saw:  more than 700 cases of meningitis,  about 13,000 blood infections,  about 5 million ear infections, and  about 200 deaths in children under 5 each year from pneumococcal disease. Since vaccine became available, severe pneumococcal disease in these children has fallen by 88%. About 18,000 older adults die of pneumococcal disease each year in the [place]. Treatment of pneumococcal infections with penicillin and other drugs is not as effective as it used to be, because some strains of the disease have become resistant to these drugs. This makes prevention of the disease, through vaccination, even more important. 2. PCV13 vaccine Pneumococcal conjugate vaccine (called  PCV13) protects against 13 types of pneumococcal bacteria. PCV13 is routinely given to children at 2, 4, 6, and 52-13 months of age. It is also recommended for children and adults 50 to 1 years of age with certain health conditions, and for all adults 57 years of age and older. Your doctor can give you details. 3. Some people should not get this vaccine Anyone who has ever had a life-threatening allergic reaction to a dose of this vaccine, to an earlier pneumococcal vaccine called PCV7, or to any vaccine containing diphtheria toxoid (for example, DTaP), should not get PCV13. Anyone with a severe allergy to any component of PCV13 should not get the vaccine. Tell your doctor if the person being vaccinated has any severe allergies. If the person scheduled for vaccination is not feeling well, your healthcare provider might decide to reschedule the shot on another day. 4. Risks of a vaccine reaction With any medicine, including vaccines, there is a chance of reactions. These are usually mild and go away on their own, but serious reactions are also possible. Problems reported following PCV13 varied by age and dose in the series. The most common problems reported among children were:  About half became drowsy after the shot, had a temporary loss of appetite, or had redness or tenderness where the shot was given.  About 1 out of 3 had swelling where the shot was given.  About 1 out of 3 had a mild fever, and about 1 in 20 had a fever over 102.68F.  Up to about 8 out of 10 became fussy or irritable. Adults have reported  pain, redness, and swelling where the shot was given; also mild fever, fatigue, headache, chills, or muscle pain. Young children who get PCV13 along with inactivated flu vaccine at the same time may be at increased risk for seizures caused by fever. Ask your doctor for more information. Problems that could happen after any vaccine:  People sometimes faint after a medical procedure,  including vaccination. Sitting or lying down for about 15 minutes can help prevent fainting, and injuries caused by a fall. Tell your doctor if you feel dizzy, or have vision changes or ringing in the ears.  Some older children and adults get severe pain in the shoulder and have difficulty moving the arm where a shot was given. This happens very rarely.  Any medication can cause a severe allergic reaction. Such reactions from a vaccine are very rare, estimated at about 1 in a million doses, and would happen within a few minutes to a few hours after the vaccination. As with any medicine, there is a very small chance of a vaccine causing a serious injury or death. The safety of vaccines is always being monitored. For more information, visit: http://www.aguilar.org/ 5. What if there is a serious reaction? What should I look for?  Look for anything that concerns you, such as signs of a severe allergic reaction, very high fever, or unusual behavior. Signs of a severe allergic reaction can include hives, swelling of the face and throat, difficulty breathing, a fast heartbeat, dizziness, and weakness-usually within a few minutes to a few hours after the vaccination. What should I do?  If you think it is a severe allergic reaction or other emergency that can't wait, call 9-1-1 or get the person to the nearest hospital. Otherwise, call your doctor. Reactions should be reported to the Vaccine Adverse Event Reporting System (VAERS). Your doctor should file this report, or you can do it yourself through the VAERS web site at www.vaers.SamedayNews.es, or by calling 5644911043. VAERS does not give medical advice. 6. The National Vaccine Injury Compensation Program The [employer] Vaccine Injury Compensation Program (VICP) is a federal program that was created to compensate people who may have been injured by certain vaccines. Persons who believe they may have been injured by a vaccine can learn about the program  and about filing a claim by calling 3674279239 or visiting the [employer] website at GoldCloset.com.ee. There is a time limit to file a claim for compensation. 7. How can I learn more?  Ask your healthcare provider. He or she can give you the vaccine package insert or suggest other sources of information.  Call your local or state health department.  Contact the Centers for Disease Control and Prevention (CDC):  Call 903-031-1379 (1-800-CDC-INFO) or  Visit CDC's website at http://hunter.com/ Vaccine Information Statement PCV13 Vaccine (12/24/2013)   This information is not intended to replace advice given to you by your health care provider. Make sure you discuss any questions you have with your health care provider.   Document Released: 12/03/2005 Document Revised: 02/26/2014 Document Reviewed: 12/31/2013 Elsevier Interactive Patient Education [employer].

## 2014-11-25 ENCOUNTER — Other Ambulatory Visit: Payer: Self-pay | Admitting: Internal Medicine

## 2014-11-25 ENCOUNTER — Telehealth: Payer: Self-pay

## 2014-11-25 DIAGNOSIS — Z87898 Personal history of other specified conditions: Secondary | ICD-10-CM

## 2014-11-25 LAB — COMPREHENSIVE METABOLIC PANEL
ALT: 21 IU/L (ref 0–32)
AST: 15 IU/L (ref 0–40)
Albumin/Globulin Ratio: 1.9 (ref 1.1–2.5)
Albumin: 4.3 g/dL (ref 3.5–5.5)
Alkaline Phosphatase: 62 IU/L (ref 39–117)
BILIRUBIN TOTAL: 0.3 mg/dL (ref 0.0–1.2)
BUN/Creatinine Ratio: 16 (ref 9–23)
BUN: 10 mg/dL (ref 6–24)
CALCIUM: 9.4 mg/dL (ref 8.7–10.2)
CHLORIDE: 103 mmol/L (ref 97–108)
CO2: 24 mmol/L (ref 18–29)
Creatinine, Ser: 0.61 mg/dL (ref 0.57–1.00)
GFR calc non Af Amer: 100 mL/min/{1.73_m2} (ref >59)
GFR, EST AFRICAN AMERICAN: 115 mL/min/{1.73_m2} (ref >59)
GLOBULIN, TOTAL: 2.3 g/dL (ref 1.5–4.5)
Glucose: 153 mg/dL — ABNORMAL HIGH (ref 65–99)
POTASSIUM: 4.5 mmol/L (ref 3.5–5.2)
Sodium: 142 mmol/L (ref 134–144)
TOTAL PROTEIN: 6.6 g/dL (ref 6.0–8.5)

## 2014-11-25 LAB — HEMOGLOBIN A1C
Est. average glucose Bld gHb Est-mCnc: 194 mg/dL
Hgb A1c MFr Bld: 8.4 % — ABNORMAL HIGH (ref 4.8–5.6)

## 2014-11-25 LAB — CBC WITH DIFFERENTIAL/PLATELET
Basophils Absolute: 0 x10E3/uL (ref 0.0–0.2)
Basos: 0 %
EOS (ABSOLUTE): 0.1 x10E3/uL (ref 0.0–0.4)
Eos: 1 %
Hematocrit: 45 % (ref 34.0–46.6)
Hemoglobin: 14.7 g/dL (ref 11.1–15.9)
Immature Grans (Abs): 0 x10E3/uL (ref 0.0–0.1)
Immature Granulocytes: 0 %
Lymphocytes Absolute: 3.2 x10E3/uL — ABNORMAL HIGH (ref 0.7–3.1)
Lymphs: 36 %
MCH: 31.8 pg (ref 26.6–33.0)
MCHC: 32.7 g/dL (ref 31.5–35.7)
MCV: 97 fL (ref 79–97)
Monocytes Absolute: 0.5 x10E3/uL (ref 0.1–0.9)
Monocytes: 6 %
Neutrophils Absolute: 5.1 x10E3/uL (ref 1.4–7.0)
Neutrophils: 57 %
Platelets: 247 x10E3/uL (ref 150–379)
RBC: 4.62 x10E6/uL (ref 3.77–5.28)
RDW: 13 % (ref 12.3–15.4)
WBC: 9 x10E3/uL (ref 3.4–10.8)

## 2014-11-25 LAB — MICROALBUMIN / CREATININE URINE RATIO
Creatinine, Urine: 14.5 mg/dL
MICROALB/CREAT RATIO: <20.7 mg/g creat (ref 0.0–30.0)
Microalbumin, Urine: <3 ug/mL

## 2014-11-25 LAB — LIPID PANEL
CHOL/HDL RATIO: 4.2 ratio (ref 0.0–4.4)
CHOLESTEROL TOTAL: 258 mg/dL — AB (ref 100–199)
HDL: 61 mg/dL (ref >39)
LDL Calculated: 172 mg/dL — ABNORMAL HIGH (ref 0–99)
Triglycerides: 124 mg/dL (ref 0–149)
VLDL Cholesterol Cal: 25 mg/dL (ref 5–40)

## 2014-11-25 LAB — TSH: TSH: 1.31 u[IU]/mL (ref 0.450–4.500)

## 2014-11-25 NOTE — Telephone Encounter (Signed)
Patient states mammography told her order was submitted wrong and you need to resubmit so she can schedule.dr

## 2014-11-26 ENCOUNTER — Other Ambulatory Visit: Payer: Self-pay | Admitting: Internal Medicine

## 2014-11-26 DIAGNOSIS — R928 Other abnormal and inconclusive findings on diagnostic imaging of breast: Secondary | ICD-10-CM

## 2014-11-26 MED ORDER — ATORVASTATIN CALCIUM 10 MG PO TABS: 10.0000 mg | ORAL_TABLET | Freq: Every day | ORAL | Status: DC

## 2014-11-26 MED ORDER — METFORMIN HCL ER 500 MG PO TB24: 500.0000 mg | ORAL_TABLET | Freq: Two times a day (BID) | ORAL | Status: DC

## 2014-12-08 ENCOUNTER — Other Ambulatory Visit: Payer: Self-pay | Admitting: Internal Medicine

## 2014-12-08 ENCOUNTER — Ambulatory Visit
Admission: RE | Admit: 2014-12-08 | Discharge: 2014-12-08 | Disposition: A | Discharge status: Home / Self Care | Payer: Medicare HMO | Source: Ambulatory Visit | Attending: Internal Medicine | Admitting: Internal Medicine

## 2014-12-08 DIAGNOSIS — R928 Other abnormal and inconclusive findings on diagnostic imaging of breast: Secondary | ICD-10-CM | POA: Insufficient documentation

## 2014-12-08 DIAGNOSIS — Z87898 Personal history of other specified conditions: Secondary | ICD-10-CM

## 2014-12-08 DIAGNOSIS — N63 Unspecified lump in breast: Secondary | ICD-10-CM | POA: Diagnosis not present

## 2014-12-13 DIAGNOSIS — M25561 Pain in right knee: Secondary | ICD-10-CM | POA: Diagnosis not present

## 2015-01-05 DIAGNOSIS — R69 Illness, unspecified: Secondary | ICD-10-CM | POA: Diagnosis not present

## 2015-01-10 ENCOUNTER — Other Ambulatory Visit: Payer: Self-pay | Admitting: Internal Medicine

## 2015-02-02 ENCOUNTER — Emergency Department

## 2015-02-02 ENCOUNTER — Encounter: Payer: Self-pay | Admitting: Emergency Medicine

## 2015-02-02 ENCOUNTER — Emergency Department
Admission: EM | Admit: 2015-02-02 | Discharge: 2015-02-02 | Disposition: A | Discharge status: Home / Self Care | Attending: Emergency Medicine | Admitting: Emergency Medicine

## 2015-02-02 DIAGNOSIS — Y9389 Activity, other specified: Secondary | ICD-10-CM | POA: Diagnosis not present

## 2015-02-02 DIAGNOSIS — Y92410 Unspecified street and highway as the place of occurrence of the external cause: Secondary | ICD-10-CM | POA: Insufficient documentation

## 2015-02-02 DIAGNOSIS — Z79899 Other long term (current) drug therapy: Secondary | ICD-10-CM | POA: Insufficient documentation

## 2015-02-02 DIAGNOSIS — M542 Cervicalgia: Secondary | ICD-10-CM | POA: Diagnosis not present

## 2015-02-02 DIAGNOSIS — G114 Hereditary spastic paraplegia: Secondary | ICD-10-CM | POA: Diagnosis not present

## 2015-02-02 DIAGNOSIS — R110 Nausea: Secondary | ICD-10-CM | POA: Diagnosis not present

## 2015-02-02 DIAGNOSIS — S161XXA Strain of muscle, fascia and tendon at neck level, initial encounter: Secondary | ICD-10-CM | POA: Diagnosis not present

## 2015-02-02 DIAGNOSIS — E119 Type 2 diabetes mellitus without complications: Secondary | ICD-10-CM | POA: Insufficient documentation

## 2015-02-02 DIAGNOSIS — S199XXA Unspecified injury of neck, initial encounter: Secondary | ICD-10-CM | POA: Diagnosis present

## 2015-02-02 DIAGNOSIS — S0990XA Unspecified injury of head, initial encounter: Secondary | ICD-10-CM | POA: Insufficient documentation

## 2015-02-02 DIAGNOSIS — Y998 Other external cause status: Secondary | ICD-10-CM | POA: Insufficient documentation

## 2015-02-02 DIAGNOSIS — Z7984 Long term (current) use of oral hypoglycemic drugs: Secondary | ICD-10-CM | POA: Insufficient documentation

## 2015-02-02 DIAGNOSIS — Z87891 Personal history of nicotine dependence: Secondary | ICD-10-CM | POA: Diagnosis not present

## 2015-02-02 DIAGNOSIS — H538 Other visual disturbances: Secondary | ICD-10-CM | POA: Diagnosis not present

## 2015-02-02 DIAGNOSIS — Z7951 Long term (current) use of inhaled steroids: Secondary | ICD-10-CM | POA: Diagnosis not present
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
## 2015-02-02 NOTE — ED Notes (Signed)
Spoke with dr Archie Balboa about testing while waiting.  He reported no tests needed at this time. Ok to go to [facility].

## 2015-02-02 NOTE — Discharge Instructions (Signed)
Cervical Sprain A cervical sprain is when the tissues (ligaments) that hold the neck bones in place stretch or tear. HOME CARE   Put ice on the injured area.  Put ice in a plastic bag.  Place a towel between your skin and the bag.  Leave the ice on for 15-20 minutes, 3-4 times a day.  You may have been given a collar to wear. This collar keeps your neck from moving while you heal.  Do not take the collar off unless told by your doctor.  If you have long hair, keep it outside of the collar.  Ask your doctor before changing the position of your collar. You may need to change its position over time to make it more comfortable.  If you are allowed to take off the collar for cleaning or bathing, follow your doctor's instructions on how to do it safely.  Keep your collar clean by wiping it with mild soap and water. Dry it completely. If the collar has removable pads, remove them every 1-2 days to hand wash them with soap and water. Allow them to air dry. They should be dry before you wear them in the collar.  Do not drive while wearing the collar.  Only take medicine as told by your doctor.  Keep all doctor visits as told.  Keep all physical therapy visits as told.  Adjust your work station so that you have good posture while you work.  Avoid positions and activities that make your problems worse.  Warm up and stretch before being active. GET HELP IF:  Your pain is not controlled with medicine.  You cannot take less pain medicine over time as planned.  Your activity level does not improve as expected. GET HELP RIGHT AWAY IF:   You are bleeding.  Your stomach is upset.  You have an allergic reaction to your medicine.  You develop new problems that you cannot explain.  You lose feeling (become numb) or you cannot move any part of your body (paralysis).  You have tingling or weakness in any part of your body.  Your symptoms get worse. Symptoms include:  Pain,  soreness, stiffness, puffiness (swelling), or a burning feeling in your neck.  Pain when your neck is touched.  Shoulder or upper back pain.  Limited ability to move your neck.  Headache.  Dizziness.  Your hands or arms feel week, lose feeling, or tingle.  Muscle spasms.  Difficulty swallowing or chewing. MAKE SURE YOU:   Understand these instructions.  Will watch your condition.  Will get help right away if you are not doing well or get worse.   continue previous medications. This information is not intended to replace advice given to you by your health care provider. Make sure you discuss any questions you have with your health care provider.   Document Released: 07/25/2007 Document Revised: 10/08/2012 Document Reviewed: 08/13/2012 Elsevier Interactive Patient Education [employer].

## 2015-02-07 ENCOUNTER — Encounter: Payer: Self-pay | Admitting: Internal Medicine

## 2015-02-07 DIAGNOSIS — M791 Myalgia: Secondary | ICD-10-CM | POA: Diagnosis not present

## 2015-02-07 DIAGNOSIS — G894 Chronic pain syndrome: Secondary | ICD-10-CM | POA: Diagnosis not present

## 2015-02-07 DIAGNOSIS — M47812 Spondylosis without myelopathy or radiculopathy, cervical region: Secondary | ICD-10-CM | POA: Diagnosis not present

## 2015-02-20 HISTORY — PX: REPLACEMENT TOTAL KNEE: SUR1224

## 2015-03-04 DIAGNOSIS — M25561 Pain in right knee: Secondary | ICD-10-CM | POA: Diagnosis not present

## 2015-03-14 DIAGNOSIS — M542 Cervicalgia: Secondary | ICD-10-CM | POA: Diagnosis not present

## 2015-03-14 DIAGNOSIS — Z79891 Long term (current) use of opiate analgesic: Secondary | ICD-10-CM | POA: Diagnosis not present

## 2015-03-14 DIAGNOSIS — M545 Low back pain: Secondary | ICD-10-CM | POA: Diagnosis not present

## 2015-03-14 DIAGNOSIS — G114 Hereditary spastic paraplegia: Secondary | ICD-10-CM | POA: Diagnosis not present

## 2015-03-23 DIAGNOSIS — M542 Cervicalgia: Secondary | ICD-10-CM | POA: Diagnosis not present

## 2015-03-28 ENCOUNTER — Encounter: Payer: Self-pay | Admitting: Internal Medicine

## 2015-03-28 ENCOUNTER — Ambulatory Visit (INDEPENDENT_AMBULATORY_CARE_PROVIDER_SITE_OTHER): Payer: Medicare HMO | Admitting: Internal Medicine

## 2015-03-28 VITALS — BP 118/74 | HR 68 | Ht 64.0 in | Wt 150.2 lb

## 2015-03-28 DIAGNOSIS — E785 Hyperlipidemia, unspecified: Secondary | ICD-10-CM

## 2015-03-28 DIAGNOSIS — E1169 Type 2 diabetes mellitus with other specified complication: Secondary | ICD-10-CM | POA: Insufficient documentation

## 2015-03-28 DIAGNOSIS — E1100 Type 2 diabetes mellitus with hyperosmolarity without nonketotic hyperglycemic-hyperosmolar coma (NKHHC): Secondary | ICD-10-CM

## 2015-03-28 DIAGNOSIS — M150 Primary generalized (osteo)arthritis: Secondary | ICD-10-CM | POA: Diagnosis not present

## 2015-03-28 DIAGNOSIS — G114 Hereditary spastic paraplegia: Secondary | ICD-10-CM | POA: Diagnosis not present

## 2015-03-28 DIAGNOSIS — M159 Polyosteoarthritis, unspecified: Secondary | ICD-10-CM

## 2015-03-28 NOTE — Patient Instructions (Signed)
Diabetic Retinopathy Diabetic retinopathy is a disease of the light-sensitive membrane at the back of the eye (retina). It is a complication of diabetes and a common cause of blindness. Early detection of the disease is key to keeping your eyes healthy.  CAUSES  Diabetic retinopathy is caused by blood sugar (glucose) levels that are too high over an extended period of time. High blood sugars cause damage to the small blood vessels of the retina, allowing blood to leak through the vessel walls. This causes visual impairment and eventually blindness. RISK FACTORS  High blood pressure.  Having diabetes for a long time.  Having poorly controlled blood sugars. SIGNS AND SYMPTOMS  In the early stages of diabetic retinopathy, there are often no symptoms. As the condition advances, symptoms may include:  Blurred vision. This is usually caused by a swelling due to abnormal blood glucose levels. The blurriness may go away when blood glucose levels return to normal.  Moving specks or dark spots (floaters) in your vision. These can be caused by a small retinal hemorrhage. A hemorrhage is bleeding from blood vessels.  Missing parts of your field of vision, such as things at the side. This can be caused by larger retinal hemorrhages.  Difficulty reading books or signs.  Double vision.  Pain in one or both eyes.  Feeling pressure in one or both eyes.  Trouble seeing straight lines. Straight lines do not look straight.  Redness of the eyes that does not go away. DIAGNOSIS  Your eye care specialist can detect changes in the blood vessels of your eye by putting drops in your eyes that enlarge (dilate) your pupils. This allows your eye care specialist to get a good look at your retina to see if there are any changes that have occurred as a result of your diabetes. You should have your eyes examined once a year. TREATMENT  Your eye care specialist may use a special laser beam to seal the blood vessels  of the retina and stop them from leaking. Early detection and treatment are important so that further damage to your eyes can be prevented. In addition, managing your blood sugars and keeping them in the target range can slow the progress of the disease. HOME CARE INSTRUCTIONS   Keep your blood pressure within your target range.  Keep your blood glucose levels within your target range.  Follow your health care provider's instructions regarding diet and other means for controlling your blood glucose levels.  Check your blood levels for glucose as recommended by your health care provider.  Keep regular appointments with your eye specialist. An eye specialist can usually see diabetic retinopathy developing long before it starts causing problems. In many cases, it can be treated to prevent complications from occurring. If you have diabetes, you should have your eyes checked at least every year. Your risk of retinopathy increases the longer you have the disease.  If you smoke, quit. Ask your health care provider for help if needed. Smoking can make retinopathy worse. SEEK MEDICAL CARE IF:   You notice gradual blurring or other changes in your vision over time.  You notice that your glasses or contact lenses do not make things look as sharp as they once did.  You have trouble reading or seeing details at a distance with either eye.  You notice a sudden change in your vision or notice that parts of your field of vision appear missing or hazy.  You suddenly see moving specks or dark spots   in the field of vision of either eye.  You have sudden partial loss of vision in either eye.   This information is not intended to replace advice given to you by your health care provider. Make sure you discuss any questions you have with your health care provider.   Document Released: 02/03/2000 Document Revised: 11/26/2012 Document Reviewed: 07/28/2012 Elsevier Interactive Patient Education 2016 Elsevier  Inc.  

## 2015-03-28 NOTE — Progress Notes (Signed)
Date:  03/28/2015   Name:  Hannah [name]   DOB:  1955-09-13   MRN:  [id number]   Chief Complaint: Diabetes and Hyperlipidemia Diabetes She presents for her follow-up diabetic visit. She has type 2 diabetes mellitus. Her disease course has been improving. There are no hypoglycemic associated symptoms. Pertinent negatives for hypoglycemia include no tremors. Pertinent negatives for diabetes include no chest pain, no fatigue and no weakness. Symptoms are stable. Current diabetic treatment includes oral agent (monotherapy) (metformin increased to bid last visit but did not tolerate). Her weight is decreasing steadily (has lost 25 lbs due to dental problems). Her breakfast blood glucose is taken between 6-7 am. Her breakfast blood glucose range is generally 130-140 mg/dl.  Hyperlipidemia This is a new problem. The current episode started more than 1 month ago. Exacerbating diseases include diabetes. Associated symptoms include myalgias. Pertinent negatives include no chest pain or shortness of breath. Current antihyperlipidemic treatment includes statins (lipitor started last visit). Risk factors for coronary artery disease include diabetes mellitus.  Knee Pain  There was no injury mechanism. The pain is present in the right knee. The quality of the pain is described as aching and burning. The pain is moderate. The pain has been worsening since onset. The symptoms are aggravated by movement and weight bearing.    Review of Systems  Constitutional: Positive for unexpected weight change. Negative for fever, diaphoresis and fatigue.  HENT: Negative for hearing loss, tinnitus and trouble swallowing.   Eyes: Negative for visual disturbance.  Respiratory: Negative for chest tightness, shortness of breath and wheezing.   Cardiovascular: Negative for chest pain, palpitations and leg swelling.  Gastrointestinal: Negative for abdominal pain, diarrhea and constipation.  Genitourinary: Negative for dysuria  and hematuria.  Musculoskeletal: Positive for myalgias, joint swelling, arthralgias and gait problem.  Neurological: Negative for tremors and weakness.  Hematological: Negative for adenopathy.  Psychiatric/Behavioral: Negative for sleep disturbance and dysphoric mood.    Patient Active Problem List   Diagnosis Date Noted  . Hyperlipidemia associated with type 2 diabetes mellitus ([place]) 03/28/2015  . Dysphagia 11/11/2014  . 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 Diabetes mellitus type 2, uncontrolled ([place]) 11/11/2014  . Chronic obstructive pulmonary emphysema ([place]) 11/11/2014  . Tobacco use disorder, mild, 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  . Degenerative joint disease involving multiple joints 09/28/2013  . 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 Authorizing Provider  albuterol (PROAIR HFA) 108 (90 BASE) MCG/ACT inhaler Inhale into the lungs. 06/22/13   Historical Provider, MD  atorvastatin (LIPITOR) 10 MG tablet Take 1 tablet (10 mg total) by mouth at bedtime. 11/26/14   Glean Hess, MD  denosumab (PROLIA) 60 MG/ML SOLN injection Inject into the skin.    Historical Provider, MD  Fluticasone-Salmeterol (ADVAIR DISKUS) 250-50 MCG/DOSE AEPB Inhale 1 puff into the lungs 2 (two) times daily. 06/22/13   Historical Provider, MD  gabapentin (NEURONTIN) 300 MG capsule Take 2 capsules by mouth 2 (two) times daily.    Historical Provider, MD  glucose blood (BAYER CONTOUR TEST) test strip BAYER CONTOUR TEST (In Vitro Strip)  1 (one) Strip daily for 50 days  Quantity: 50;  Refills: 5   Ordered :[id number]  Halina Maidens M.D.;  Started 22-Jun-2013 Active Comments: dx: 250.02 06/22/13   Historical Provider, MD  ibuprofen (ADVIL,MOTRIN) 200 MG tablet Take by mouth.    Historical Provider, MD  metFORMIN (GLUCOPHAGE-XR)  500 MG 24 hr tablet Take 1 tablet (500 mg total) by mouth 2 (two)  times daily. 11/26/14   Glean Hess, MD  Misc Natural Products (GLUCOSAMINE CHOND COMPLEX/MSM) TABS Take by mouth.    Historical Provider, MD  Multiple Vitamin (MULTIVITAMIN WITH MINERALS) TABS tablet Take 1 tablet by mouth daily.    Historical Provider, MD  omeprazole (PRILOSEC) 20 MG capsule TAKE 1 CAPSULE BY MOUTH EVERY DAY 01/10/15   Glean Hess, MD  oxyCODONE-acetaminophen (PERCOCET/ROXICET) 5-325 MG per tablet Take 1-2 tablets by mouth every 6 (six) hours as needed for pain. 11/23/12   Carlisle Cater, PA-C  tiZANidine (ZANAFLEX) 4 MG tablet Take 4 mg by mouth every 6 (six) hours as needed.  10/01/12   Historical Provider, MD    No Known Allergies  Past Surgical History  Procedure Laterality Date  . Inner ear surgery Bilateral   . Shoulder surgery Bilateral     rotator cuff  . Hip fracture surgery Right 03/2013    Social History  Substance Use Topics  . Smoking status: Former Smoker -- 2.00 packs/day    Types: Cigarettes  . Smokeless tobacco: Never Used  . Alcohol Use: Yes    Medication list has been reviewed and updated.   Physical Exam  Constitutional: She is oriented to person, place, and time. She appears well-developed and well-nourished. No distress.  HENT:  Head: Normocephalic and atraumatic.  Neck: Normal range of motion. Neck supple. No thyromegaly present.  Cardiovascular: Normal rate, regular rhythm and normal heart sounds.  Exam reveals no gallop and no friction rub.   No murmur heard. Pulmonary/Chest: Effort normal and breath sounds normal. No respiratory distress.  Abdominal: Soft.  Musculoskeletal:       Right knee: She exhibits swelling. Tenderness found.  Lymphadenopathy:    She has no cervical adenopathy.  Neurological: She is alert and oriented to person, place, and time.  Skin: Skin is warm and dry. No rash noted.  Psychiatric: She has a normal mood and affect. Her behavior is normal. Thought content normal.    BP 118/74 mmHg  Pulse 68   Ht 5\' 4"  (1.626 m)  Wt 150 lb 3.2 oz (68.13 kg)  BMI 25.77 kg/m2  Assessment and Plan: 1. Uncontrolled type 2 diabetes mellitus with hyperosmolarity without coma, without long-term current use of insulin ([place]) Did not tolerate higher dose metformin Consider adding another agent if needed Encourage eye exam - Comprehensive metabolic panel - Hemoglobin A1c  2. Hyperlipidemia associated with type 2 diabetes mellitus ([place]) On lipitor now - no side effects - Lipid panel  3. Primary osteoarthritis involving multiple joints Planning Right TKR surgery  4. 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

## 2015-03-29 ENCOUNTER — Telehealth: Payer: Self-pay

## 2015-03-29 DIAGNOSIS — M542 Cervicalgia: Secondary | ICD-10-CM | POA: Diagnosis not present

## 2015-03-29 LAB — COMPREHENSIVE METABOLIC PANEL
A/G RATIO: 1.8 (ref 1.1–2.5)
ALT: 17 IU/L (ref 0–32)
AST: 19 IU/L (ref 0–40)
Albumin: 4.5 g/dL (ref 3.5–5.5)
Alkaline Phosphatase: 66 IU/L (ref 39–117)
BUN/Creatinine Ratio: 12 (ref 9–23)
BUN: 8 mg/dL (ref 6–24)
Bilirubin Total: 0.3 mg/dL (ref 0.0–1.2)
CALCIUM: 9.8 mg/dL (ref 8.7–10.2)
CO2: 25 mmol/L (ref 18–29)
Chloride: 98 mmol/L (ref 96–106)
Creatinine, Ser: 0.66 mg/dL (ref 0.57–1.00)
GFR, EST AFRICAN AMERICAN: 112 mL/min/{1.73_m2} (ref >59)
GFR, EST NON AFRICAN AMERICAN: 97 mL/min/{1.73_m2} (ref >59)
GLOBULIN, TOTAL: 2.5 g/dL (ref 1.5–4.5)
Glucose: 128 mg/dL — ABNORMAL HIGH (ref 65–99)
POTASSIUM: 4.6 mmol/L (ref 3.5–5.2)
SODIUM: 140 mmol/L (ref 134–144)
TOTAL PROTEIN: 7 g/dL (ref 6.0–8.5)

## 2015-03-29 LAB — HEMOGLOBIN A1C
Est. average glucose Bld gHb Est-mCnc: 148 mg/dL
Hgb A1c MFr Bld: 6.8 % — ABNORMAL HIGH (ref 4.8–5.6)

## 2015-03-29 LAB — LIPID PANEL
CHOL/HDL RATIO: 3.3 ratio (ref 0.0–4.4)
Cholesterol, Total: 196 mg/dL (ref 100–199)
HDL: 59 mg/dL (ref >39)
LDL CALC: 118 mg/dL — AB (ref 0–99)
Triglycerides: 94 mg/dL (ref 0–149)
VLDL Cholesterol Cal: 19 mg/dL (ref 5–40)

## 2015-03-29 NOTE — Telephone Encounter (Signed)
Tried calling patient without answer. Will try again.

## 2015-03-29 NOTE — Telephone Encounter (Signed)
-----   Message from Glean Hess, MD sent at 03/29/2015  8:39 AM EST ----- DM is much better! (the weight loss has helped).  Cholesterol, kidney and liver functions are normal.

## 2015-04-06 NOTE — Telephone Encounter (Signed)
Spoke with patient. Patient advised of all results and verbalized understanding. Will call back with any future questions or concerns. MAH  

## 2015-05-20 DIAGNOSIS — M1711 Unilateral primary osteoarthritis, right knee: Secondary | ICD-10-CM | POA: Diagnosis not present

## 2015-06-13 DIAGNOSIS — G114 Hereditary spastic paraplegia: Secondary | ICD-10-CM | POA: Diagnosis not present

## 2015-06-13 DIAGNOSIS — M545 Low back pain: Secondary | ICD-10-CM | POA: Diagnosis not present

## 2015-06-13 DIAGNOSIS — M25572 Pain in left ankle and joints of left foot: Secondary | ICD-10-CM | POA: Diagnosis not present

## 2015-06-13 DIAGNOSIS — M542 Cervicalgia: Secondary | ICD-10-CM | POA: Diagnosis not present

## 2015-06-13 DIAGNOSIS — M79672 Pain in left foot: Secondary | ICD-10-CM | POA: Diagnosis not present

## 2015-06-14 DIAGNOSIS — M25561 Pain in right knee: Secondary | ICD-10-CM | POA: Diagnosis not present

## 2015-06-14 DIAGNOSIS — M1711 Unilateral primary osteoarthritis, right knee: Secondary | ICD-10-CM | POA: Diagnosis not present

## 2015-06-15 DIAGNOSIS — M1711 Unilateral primary osteoarthritis, right knee: Secondary | ICD-10-CM | POA: Diagnosis not present

## 2015-06-24 DIAGNOSIS — Z01818 Encounter for other preprocedural examination: Secondary | ICD-10-CM | POA: Diagnosis not present

## 2015-06-24 DIAGNOSIS — M1711 Unilateral primary osteoarthritis, right knee: Secondary | ICD-10-CM | POA: Diagnosis not present

## 2015-07-01 DIAGNOSIS — E1039 Type 1 diabetes mellitus with other diabetic ophthalmic complication: Secondary | ICD-10-CM | POA: Diagnosis not present

## 2015-07-07 DIAGNOSIS — J449 Chronic obstructive pulmonary disease, unspecified: Secondary | ICD-10-CM | POA: Diagnosis not present

## 2015-07-07 DIAGNOSIS — I959 Hypotension, unspecified: Secondary | ICD-10-CM | POA: Diagnosis not present

## 2015-07-07 DIAGNOSIS — E119 Type 2 diabetes mellitus without complications: Secondary | ICD-10-CM | POA: Diagnosis not present

## 2015-07-07 DIAGNOSIS — M179 Osteoarthritis of knee, unspecified: Secondary | ICD-10-CM | POA: Diagnosis not present

## 2015-07-07 DIAGNOSIS — G114 Hereditary spastic paraplegia: Secondary | ICD-10-CM | POA: Diagnosis not present

## 2015-07-07 DIAGNOSIS — M1711 Unilateral primary osteoarthritis, right knee: Secondary | ICD-10-CM | POA: Diagnosis not present

## 2015-07-07 DIAGNOSIS — D649 Anemia, unspecified: Secondary | ICD-10-CM | POA: Diagnosis not present

## 2015-07-07 DIAGNOSIS — K219 Gastro-esophageal reflux disease without esophagitis: Secondary | ICD-10-CM | POA: Diagnosis not present

## 2015-07-07 DIAGNOSIS — R001 Bradycardia, unspecified: Secondary | ICD-10-CM | POA: Diagnosis not present

## 2015-07-07 DIAGNOSIS — I10 Essential (primary) hypertension: Secondary | ICD-10-CM | POA: Diagnosis not present

## 2015-07-07 DIAGNOSIS — M25561 Pain in right knee: Secondary | ICD-10-CM | POA: Diagnosis not present

## 2015-07-07 DIAGNOSIS — Z7984 Long term (current) use of oral hypoglycemic drugs: Secondary | ICD-10-CM | POA: Diagnosis not present

## 2015-07-07 DIAGNOSIS — G8918 Other acute postprocedural pain: Secondary | ICD-10-CM | POA: Diagnosis not present

## 2015-07-20 DIAGNOSIS — M1711 Unilateral primary osteoarthritis, right knee: Secondary | ICD-10-CM | POA: Diagnosis not present

## 2015-07-20 DIAGNOSIS — Z96651 Presence of right artificial knee joint: Secondary | ICD-10-CM | POA: Diagnosis not present

## 2015-07-26 DIAGNOSIS — Z96651 Presence of right artificial knee joint: Secondary | ICD-10-CM | POA: Diagnosis not present

## 2015-08-03 DIAGNOSIS — Z96651 Presence of right artificial knee joint: Secondary | ICD-10-CM | POA: Diagnosis not present

## 2015-08-05 DIAGNOSIS — Z96651 Presence of right artificial knee joint: Secondary | ICD-10-CM | POA: Diagnosis not present

## 2015-08-09 DIAGNOSIS — Z96651 Presence of right artificial knee joint: Secondary | ICD-10-CM | POA: Diagnosis not present

## 2015-08-11 DIAGNOSIS — M542 Cervicalgia: Secondary | ICD-10-CM | POA: Diagnosis not present

## 2015-08-11 DIAGNOSIS — G114 Hereditary spastic paraplegia: Secondary | ICD-10-CM | POA: Diagnosis not present

## 2015-08-11 DIAGNOSIS — M79672 Pain in left foot: Secondary | ICD-10-CM | POA: Diagnosis not present

## 2015-08-11 DIAGNOSIS — M545 Low back pain: Secondary | ICD-10-CM | POA: Diagnosis not present

## 2015-08-12 DIAGNOSIS — Z96651 Presence of right artificial knee joint: Secondary | ICD-10-CM | POA: Diagnosis not present

## 2015-08-17 DIAGNOSIS — Z96651 Presence of right artificial knee joint: Secondary | ICD-10-CM | POA: Diagnosis not present

## 2015-08-25 ENCOUNTER — Encounter: Payer: Self-pay | Admitting: Internal Medicine

## 2015-08-25 ENCOUNTER — Ambulatory Visit (INDEPENDENT_AMBULATORY_CARE_PROVIDER_SITE_OTHER): Payer: Medicare HMO | Admitting: Internal Medicine

## 2015-08-25 ENCOUNTER — Other Ambulatory Visit: Payer: Self-pay | Admitting: Internal Medicine

## 2015-08-25 VITALS — BP 122/76 | HR 70 | Resp 16 | Ht 64.0 in | Wt 158.0 lb

## 2015-08-25 DIAGNOSIS — E1169 Type 2 diabetes mellitus with other specified complication: Secondary | ICD-10-CM

## 2015-08-25 DIAGNOSIS — E1100 Type 2 diabetes mellitus with hyperosmolarity without nonketotic hyperglycemic-hyperosmolar coma (NKHHC): Secondary | ICD-10-CM

## 2015-08-25 DIAGNOSIS — J439 Emphysema, unspecified: Secondary | ICD-10-CM | POA: Diagnosis not present

## 2015-08-25 DIAGNOSIS — E785 Hyperlipidemia, unspecified: Secondary | ICD-10-CM | POA: Diagnosis not present

## 2015-08-25 DIAGNOSIS — M810 Age-related osteoporosis without current pathological fracture: Secondary | ICD-10-CM

## 2015-08-25 DIAGNOSIS — Z96651 Presence of right artificial knee joint: Secondary | ICD-10-CM | POA: Diagnosis not present

## 2015-08-25 DIAGNOSIS — G114 Hereditary spastic paraplegia: Secondary | ICD-10-CM | POA: Diagnosis not present

## 2015-08-25 MED ORDER — GABAPENTIN 600 MG PO TABS: 600.0000 mg | ORAL_TABLET | Freq: Three times a day (TID) | ORAL | Status: AC

## 2015-08-25 NOTE — Progress Notes (Signed)
Date:  08/25/2015   Name:  Hannah [name]   DOB:  [birth year]   MRN:  [id number]   Chief Complaint: Diabetes Diabetes She presents for her follow-up diabetic visit. She has type 2 diabetes mellitus. Her disease course has been improving. Hypoglycemia symptoms include tremors. Pertinent negatives for hypoglycemia include no dizziness or headaches. Pertinent negatives for diabetes include no chest pain, no fatigue and no weakness. Symptoms are stable. Her breakfast blood glucose is taken between 7-8 am. Her breakfast blood glucose range is generally 110-130 mg/dl.  Hyperlipidemia This is a chronic problem. The current episode started more than 1 year ago. The problem is controlled. Recent lipid tests were reviewed and are normal. Associated symptoms include myalgias. Pertinent negatives include no chest pain or shortness of breath. Current antihyperlipidemic treatment includes statins.  COPD - doing well with breathing.  She is not using her Advair every day.  She denies productive cough or wheezing. Spastic paraparesis - stable symptoms.  She is using a scooter today since her knee surgery.  She needs a refill on gabapentin. Osteoporosis - treated with several injections of Prolia.  She is not sure if she has completed the course.  She is very concerned about avoiding further fractures.  Lab Results  Component Value Date   HGBA1C 6.8* 03/28/2015   Lab Results  Component Value Date   CHOL 196 03/28/2015   HDL 59 03/28/2015   LDLCALC 118* 03/28/2015   TRIG 94 03/28/2015   CHOLHDL 3.3 03/28/2015     Review of Systems  Constitutional: Positive for unexpected weight change (gained about 10 lbs since knee surgery). Negative for fever and fatigue.  Respiratory: Negative for cough, chest tightness, shortness of breath and wheezing.   Cardiovascular: Negative for chest pain, palpitations and leg swelling.  Musculoskeletal: Positive for myalgias and gait problem.  Neurological: Positive for  tremors. Negative for dizziness, weakness and headaches.    Patient Active Problem List   Diagnosis Date Noted  . Hyperlipidemia associated with type 2 diabetes mellitus ([place]) 03/28/2015  . Dysphagia 11/11/2014  . 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 Diabetes mellitus type 2, uncontrolled ([place]) 11/11/2014  . Chronic obstructive pulmonary emphysema ([place]) 11/11/2014  . Tobacco use disorder, mild, 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  . Degenerative joint disease involving multiple joints 09/28/2013  . 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 Authorizing Provider  albuterol (PROAIR HFA) 108 (90 BASE) MCG/ACT inhaler Inhale into the lungs. 06/22/13  Yes Historical Provider, MD  atorvastatin (LIPITOR) 10 MG tablet Take 1 tablet (10 mg total) by mouth at bedtime. 11/26/14  Yes Glean Hess, MD  denosumab (PROLIA) 60 MG/ML SOLN injection Inject into the skin.   Yes Historical Provider, MD  Fluticasone-Salmeterol (ADVAIR DISKUS) 250-50 MCG/DOSE AEPB Inhale 1 puff into the lungs 2 (two) times daily. 06/22/13  Yes Historical Provider, MD  gabapentin (NEURONTIN) 600 MG tablet Take 3 tablets by mouth daily. 07/21/15  Yes Historical Provider, MD  glucose blood (BAYER CONTOUR TEST) test strip BAYER CONTOUR TEST (In Vitro Strip)  1 (one) Strip daily for 50 days  Quantity: 50;  Refills: 5   Ordered :[id number]  Halina Maidens M.D.;  Started 22-Jun-2013 Active Comments: dx: 250.02 06/22/13  Yes Historical Provider, MD  ibuprofen (ADVIL,MOTRIN) 200 MG tablet Take by mouth.   Yes Historical Provider, MD  metFORMIN (GLUCOPHAGE-XR) 500 MG 24 hr tablet Take 1  tablet (500 mg total) by mouth 2 (two) times daily. Patient taking differently: Take 500 mg by mouth daily with breakfast.  11/26/14  Yes Glean Hess, MD  Multiple Vitamin (MULTIVITAMIN WITH MINERALS) TABS tablet Take 1 tablet by mouth  daily.   Yes Historical Provider, MD  omeprazole (PRILOSEC) 20 MG capsule TAKE 1 CAPSULE BY MOUTH EVERY DAY 01/10/15  Yes Glean Hess, MD  oxyCODONE-acetaminophen (PERCOCET/ROXICET) 5-325 MG per tablet Take 1-2 tablets by mouth every 6 (six) hours as needed for pain. 11/23/12  Yes Carlisle Cater, PA-C  tiZANidine (ZANAFLEX) 4 MG tablet Take 4 mg by mouth every 6 (six) hours as needed.  10/01/12  Yes Historical Provider, MD    No Known Allergies  Past Surgical History  Procedure Laterality Date  . Inner ear surgery Bilateral   . Shoulder surgery Bilateral     rotator cuff  . Hip fracture surgery Right 03/2013  . Replacement total knee Right 02/2015    Social History  Substance Use Topics  . Smoking status: Former Smoker -- 2.00 packs/day    Types: Cigarettes  . Smokeless tobacco: Never Used  . Alcohol Use: Yes     Medication list has been reviewed and updated.   Physical Exam  Constitutional: She is oriented to person, place, and time. She appears well-developed. No distress.  HENT:  Head: Normocephalic and atraumatic.  Pulmonary/Chest: Effort normal. No respiratory distress.  Musculoskeletal: Normal range of motion.  Neurological: She is alert and oriented to person, place, and time.  Skin: Skin is warm and dry. No rash noted.  Psychiatric: She has a normal mood and affect. Her behavior is normal. Thought content normal.    BP 122/76 mmHg  Pulse 70  Resp 16  Ht 5\' 4"  (1.626 m)  Wt 158 lb (71.668 kg)  BMI 27.11 kg/m2  SpO2 98%  Assessment and Plan: 1. Uncontrolled type 2 diabetes mellitus with hyperosmolarity without coma, without long-term current use of insulin ([place]) Continue current therapy Check labs in October  2. Autosomal dominant hereditary spastic paraplegia ([facility]) Continue gabapentin 2-3 times per day - gabapentin (NEURONTIN) 600 MG tablet; Take 1 tablet (600 mg total) by mouth 3 (three) times daily.  Dispense: 90 tablet; Refill: 5  3. Hyperlipidemia  associated with type 2 diabetes mellitus ([place]) Doing well on atorvastatin.  4. Osteoporosis She will contact Dr. Nicole Kindred regarding on-going therapy  5. Pulmonary emphysema, unspecified emphysema type ([place]) Continue Advair and albuterol  Halina Maidens, MD [facility]  08/25/2015

## 2015-08-25 NOTE — Patient Instructions (Signed)
Breast Self-Awareness Practicing breast self-awareness may pick up problems early, prevent significant medical complications, and possibly save your life. By practicing breast self-awareness, you can become familiar with how your breasts look and feel and if your breasts are changing. This allows you to notice changes early. It can also offer you some reassurance that your breast health is good. One way to learn what is normal for your breasts and whether your breasts are changing is to do a breast self-exam. If you find a lump or something that was not present in the past, it is best to contact your caregiver right away. Other findings that should be evaluated by your caregiver include nipple discharge, especially if it is bloody; skin changes or reddening; areas where the skin seems to be pulled in (retracted); or new lumps and bumps. Breast pain is seldom associated with cancer (malignancy), but should also be evaluated by a caregiver. HOW TO PERFORM A BREAST SELF-EXAM The best time to examine your breasts is 5-7 days after your menstrual period is over. During menstruation, the breasts are lumpier, and it may be more difficult to pick up changes. If you do not menstruate, have reached menopause, or had your uterus removed (hysterectomy), you should examine your breasts at regular intervals, such as monthly. If you are breastfeeding, examine your breasts after a feeding or after using a breast pump. Breast implants do not decrease the risk for lumps or tumors, so continue to perform breast self-exams as recommended. Talk to your caregiver about how to determine the difference between the implant and breast tissue. Also, talk about the amount of pressure you should use during the exam. Over time, you will become more familiar with the variations of your breasts and more comfortable with the exam. A breast self-exam requires you to remove all your clothes above the waist. 1. Look at your breasts and nipples.  Stand in front of a mirror in a room with good lighting. With your hands on your hips, push your hands firmly downward. Look for a difference in shape, contour, and size from one breast to the other (asymmetry). Asymmetry includes puckers, dips, or bumps. Also, look for skin changes, such as reddened or scaly areas on the breasts. Look for nipple changes, such as discharge, dimpling, repositioning, or redness. 2. Carefully feel your breasts. This is best done either in the shower or tub while using soapy water or when flat on your back. Place the arm (on the side of the breast you are examining) above your head. Use the pads (not the fingertips) of your three middle fingers on your opposite hand to feel your breasts. Start in the underarm area and use  inch (2 cm) overlapping circles to feel your breast. Use 3 different levels of pressure (light, medium, and firm pressure) at each circle before moving to the next circle. The light pressure is needed to feel the tissue closest to the skin. The medium pressure will help to feel breast tissue a little deeper, while the firm pressure is needed to feel the tissue close to the ribs. Continue the overlapping circles, moving downward over the breast until you feel your ribs below your breast. Then, move one finger-width towards the center of the body. Continue to use the  inch (2 cm) overlapping circles to feel your breast as you move slowly up toward the collar bone (clavicle) near the base of the neck. Continue the up and down exam using all 3 pressures until you reach the   middle of the chest. Do this with each breast, carefully feeling for lumps or changes. 3.  Keep a written record with breast changes or normal findings for each breast. By writing this information down, you do not need to depend only on memory for size, tenderness, or location. Write down where you are in your menstrual cycle, if you are still menstruating. Breast tissue can have some lumps or  thick tissue. However, see your caregiver if you find anything that concerns you.  SEEK MEDICAL CARE IF:  You see a change in shape, contour, or size of your breasts or nipples.   You see skin changes, such as reddened or scaly areas on the breasts or nipples.   You have an unusual discharge from your nipples.   You feel a new lump or unusually thick areas.    This information is not intended to replace advice given to you by your health care provider. Make sure you discuss any questions you have with your health care provider.   Document Released: 02/05/2005 Document Revised: 01/23/2012 Document Reviewed: 05/23/2011 Elsevier Interactive Patient Education 2016 Elsevier Inc[employer]  

## 2015-08-30 DIAGNOSIS — Z96651 Presence of right artificial knee joint: Secondary | ICD-10-CM | POA: Diagnosis not present

## 2015-09-01 DIAGNOSIS — Z96651 Presence of right artificial knee joint: Secondary | ICD-10-CM | POA: Diagnosis not present

## 2015-09-06 DIAGNOSIS — Z96651 Presence of right artificial knee joint: Secondary | ICD-10-CM | POA: Diagnosis not present

## 2015-09-08 DIAGNOSIS — Z96651 Presence of right artificial knee joint: Secondary | ICD-10-CM | POA: Diagnosis not present

## 2015-09-13 DIAGNOSIS — Z96651 Presence of right artificial knee joint: Secondary | ICD-10-CM | POA: Diagnosis not present

## 2015-09-15 DIAGNOSIS — Z96651 Presence of right artificial knee joint: Secondary | ICD-10-CM | POA: Diagnosis not present

## 2015-09-20 DIAGNOSIS — Z96651 Presence of right artificial knee joint: Secondary | ICD-10-CM | POA: Diagnosis not present

## 2015-09-22 DIAGNOSIS — Z96651 Presence of right artificial knee joint: Secondary | ICD-10-CM | POA: Diagnosis not present

## 2015-09-28 DIAGNOSIS — Z96651 Presence of right artificial knee joint: Secondary | ICD-10-CM | POA: Diagnosis not present

## 2015-11-09 DIAGNOSIS — M79672 Pain in left foot: Secondary | ICD-10-CM | POA: Diagnosis not present

## 2015-11-09 DIAGNOSIS — M545 Low back pain: Secondary | ICD-10-CM | POA: Diagnosis not present

## 2015-11-09 DIAGNOSIS — G114 Hereditary spastic paraplegia: Secondary | ICD-10-CM | POA: Diagnosis not present

## 2015-11-09 DIAGNOSIS — M542 Cervicalgia: Secondary | ICD-10-CM | POA: Diagnosis not present

## 2015-11-25 ENCOUNTER — Encounter: Payer: Self-pay | Admitting: Internal Medicine

## 2015-11-25 ENCOUNTER — Other Ambulatory Visit: Payer: Self-pay | Admitting: Internal Medicine

## 2015-11-25 ENCOUNTER — Ambulatory Visit (INDEPENDENT_AMBULATORY_CARE_PROVIDER_SITE_OTHER): Payer: Medicare HMO | Admitting: Internal Medicine

## 2015-11-25 VITALS — BP 120/64 | HR 64 | Ht 65.0 in | Wt 152.0 lb

## 2015-11-25 DIAGNOSIS — E1169 Type 2 diabetes mellitus with other specified complication: Secondary | ICD-10-CM | POA: Diagnosis not present

## 2015-11-25 DIAGNOSIS — J439 Emphysema, unspecified: Secondary | ICD-10-CM | POA: Diagnosis not present

## 2015-11-25 DIAGNOSIS — Z1231 Encounter for screening mammogram for malignant neoplasm of breast: Secondary | ICD-10-CM

## 2015-11-25 DIAGNOSIS — M818 Other osteoporosis without current pathological fracture: Secondary | ICD-10-CM | POA: Insufficient documentation

## 2015-11-25 DIAGNOSIS — E1100 Type 2 diabetes mellitus with hyperosmolarity without nonketotic hyperglycemic-hyperosmolar coma (NKHHC): Secondary | ICD-10-CM

## 2015-11-25 DIAGNOSIS — Z23 Encounter for immunization: Secondary | ICD-10-CM | POA: Diagnosis not present

## 2015-11-25 DIAGNOSIS — E785 Hyperlipidemia, unspecified: Secondary | ICD-10-CM | POA: Diagnosis not present

## 2015-11-25 DIAGNOSIS — Z1211 Encounter for screening for malignant neoplasm of colon: Secondary | ICD-10-CM | POA: Diagnosis not present

## 2015-11-25 DIAGNOSIS — G114 Hereditary spastic paraplegia: Secondary | ICD-10-CM

## 2015-11-25 DIAGNOSIS — Z0000 Encounter for general adult medical examination without abnormal findings: Secondary | ICD-10-CM

## 2015-11-25 LAB — POCT URINALYSIS DIPSTICK
Bilirubin, UA: NEGATIVE
Blood, UA: NEGATIVE
Glucose, UA: NEGATIVE
KETONES UA: NEGATIVE
Leukocytes, UA: NEGATIVE
Nitrite, UA: NEGATIVE
PH UA: 5
PROTEIN UA: NEGATIVE
SPEC GRAV UA: 1.01
Urobilinogen, UA: 0.2

## 2015-11-25 NOTE — Patient Instructions (Signed)
Health Maintenance  Topic Date Due  . OPHTHALMOLOGY EXAM  08/05/1965  . HIV Screening  08/06/1970  . TETANUS/TDAP  08/06/1974  . COLONOSCOPY  02/21/2015  . PAP SMEAR  05/21/2015  . ZOSTAVAX  08/06/2015  . INFLUENZA VACCINE  09/20/2015  . Hepatitis C Screening  03/27/2020 (Originally 1955-07-04)  . HEMOGLOBIN A1C  05/25/2016  . FOOT EXAM  11/24/2016  . URINE MICROALBUMIN  11/24/2016  . MAMMOGRAM  12/07/2016  . PNEUMOCOCCAL POLYSACCHARIDE VACCINE (2) 05/20/2017

## 2015-11-25 NOTE — Progress Notes (Signed)
Patient: Hannah [name], Female    DOB: [birth year], 60 y.o.   MRN: [id number] Visit Date: 11/25/2015  Today's Provider: Halina Maidens, MD   Chief Complaint  Patient presents with  . Annual Exam    mammogram  . Diabetes    "have basically stopped taking my Metformin because I feel sluggish when taking it"   Subjective:    Annual wellness visit Hannah [name] is a 60 y.o. female who presents today for her Subsequent Annual Wellness Visit. She feels fairly well. She reports exercising working on [employer], [employer]. She reports she is sleeping fairly well. She has lost about 20 lbs since last year - moving around more since knee surgery.    ----------------------------------------------------------- Diabetes  She presents for her follow-up diabetic visit. She has type 2 diabetes mellitus. Pertinent negatives for hypoglycemia include no dizziness, headaches, nervousness/anxiousness or tremors. Associated symptoms include weight loss. Pertinent negatives for diabetes include no chest pain, no fatigue, no polydipsia and no polyuria. When asked about current treatments, none (stop metformin due to wt loss and low BS) were reported. Her home blood glucose trend is decreasing steadily.  Hyperlipidemia  This is a chronic problem. The problem is controlled. Recent lipid tests were reviewed and are normal. Associated symptoms include shortness of breath. Pertinent negatives include no chest pain. Current antihyperlipidemic treatment includes statins.    Review of Systems  Constitutional: Positive for unexpected weight change and weight loss. Negative for chills, fatigue and fever.  HENT: Negative for congestion, hearing loss, tinnitus, trouble swallowing and voice change.   Eyes: Negative for visual disturbance.  Respiratory: Positive for shortness of breath. Negative for cough, chest tightness and wheezing.   Cardiovascular: Negative for chest pain, palpitations and leg swelling.    Gastrointestinal: Negative for abdominal pain, constipation, diarrhea and vomiting.  Endocrine: Negative for polydipsia and polyuria.  Genitourinary: Negative for dysuria, frequency, genital sores, vaginal bleeding and vaginal discharge.  Musculoskeletal: Positive for back pain and gait problem (uses rollator or cane). Negative for arthralgias and joint swelling.  Skin: Negative for color change and rash.  Neurological: Negative for dizziness, tremors, light-headedness and headaches.  Hematological: Negative for adenopathy. Does not bruise/bleed easily.  Psychiatric/Behavioral: Negative for dysphoric mood and sleep disturbance. The patient is not nervous/anxious.     Social History   Social History  . Marital status: Married    Spouse name: N/A  . Number of children: N/A  . Years of education: N/A   Occupational History  . Not on file.   Social History Main Topics  . Smoking status: Former Smoker    Packs/day: 2.00    Types: Cigarettes  . Smokeless tobacco: Never Used  . Alcohol use Yes  . Drug use: No  . Sexual activity: No   Other Topics Concern  . Not on file   Social History Narrative  . No narrative on file    Patient Active Problem List   Diagnosis Date Noted  . Osteoporosis 08/25/2015  . Hyperlipidemia associated with type 2 diabetes mellitus ([place]) 03/28/2015  . Dysphagia 11/11/2014  . 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 Diabetes mellitus type 2, uncontrolled ([place]) 11/11/2014  . Chronic obstructive pulmonary emphysema ([place]) 11/11/2014  . Tobacco use disorder, mild, 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  . Degenerative joint disease involving multiple joints 09/28/2013  . 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 HIP FRACTURE SURGERY Right 03/2013  . INNER  EAR SURGERY Bilateral   . REPLACEMENT TOTAL KNEE Right 02/2015  . SHOULDER SURGERY Bilateral    rotator cuff     Her family history includes Cervical cancer in her mother.    Previous Medications   ALBUTEROL (PROAIR HFA) 108 (90 BASE) MCG/ACT INHALER    Inhale into the lungs.   ATORVASTATIN (LIPITOR) 10 MG TABLET    Take 1 tablet (10 mg total) by mouth at bedtime.   DENOSUMAB (PROLIA) 60 MG/ML SOLN INJECTION    Inject into the skin.   FLUTICASONE-SALMETEROL (ADVAIR DISKUS) 250-50 MCG/DOSE AEPB    Inhale 1 puff into the lungs 2 (two) times daily.   GABAPENTIN (NEURONTIN) 600 MG TABLET    Take 1 tablet (600 mg total) by mouth 3 (three) times daily.   GLUCOSE BLOOD (BAYER CONTOUR TEST) TEST STRIP    BAYER CONTOUR TEST (In Vitro Strip)  1 (one) Strip daily for 50 days  Quantity: 50;  Refills: 5   Ordered :[id number]  Halina Maidens M.D.;  Started 22-Jun-2013 Active Comments: dx: 250.02   IBUPROFEN (ADVIL,MOTRIN) 200 MG TABLET    Take by mouth.   METFORMIN (GLUCOPHAGE-XR) 500 MG 24 HR TABLET    Take 1 tablet (500 mg total) by mouth 2 (two) times daily.   MULTIPLE VITAMIN (MULTIVITAMIN WITH MINERALS) TABS TABLET    Take 1 tablet by mouth daily.   OMEPRAZOLE (PRILOSEC) 20 MG CAPSULE    TAKE 1 CAPSULE BY MOUTH EVERY DAY   OXYCODONE-ACETAMINOPHEN (PERCOCET/ROXICET) 5-325 MG PER TABLET    Take 1-2 tablets by mouth every 6 (six) hours as needed for pain.   TIZANIDINE (ZANAFLEX) 4 MG TABLET    Take 4 mg by mouth every 6 (six) hours as needed.     Patient Care Team: Glean Hess, MD as PCP - General (Family Medicine) Carlos Levering, MD as Referring Physician (Physical Medicine and Rehabilitation)     Objective:   Vitals: BP 120/64   Pulse 64   Ht 5\' 5"  (1.651 m)   Wt 152 lb (68.9 kg)   BMI 25.29 kg/m   Physical Exam  Constitutional: She is oriented to person, place, and time. She appears well-developed and well-nourished. No distress.  HENT:  Head: Normocephalic and atraumatic.  Right Ear: Tympanic membrane and ear canal normal.  Left Ear: Tympanic membrane and ear canal normal.   Nose: Right sinus exhibits no maxillary sinus tenderness. Left sinus exhibits no maxillary sinus tenderness.  Mouth/Throat: Uvula is midline and oropharynx is clear and moist.  Eyes: Conjunctivae and EOM are normal. Right eye exhibits no discharge. Left eye exhibits no discharge. No scleral icterus.  Neck: Normal range of motion. Carotid bruit is not present. No erythema present. No thyromegaly present.  Cardiovascular: Normal rate, regular rhythm, normal heart sounds and normal pulses.   Pulmonary/Chest: Effort normal. No respiratory distress. She has no wheezes. Right breast exhibits no mass, no nipple discharge, no skin change and no tenderness. Left breast exhibits no mass, no nipple discharge, no skin change and no tenderness.  Abdominal: Soft. Bowel sounds are normal. There is no hepatosplenomegaly. There is no tenderness. There is no CVA tenderness.  Musculoskeletal: She exhibits no edema.  Lymphadenopathy:    She has no cervical adenopathy.    She has no axillary adenopathy.  Neurological: She is alert and oriented to person, place, and time. She has normal strength. No cranial nerve deficit or sensory deficit. Gait abnormal.  Foot exam - normal skin, nails, pulses and sensation  bilaterally   Skin: Skin is warm, dry and intact. No rash noted.  Psychiatric: She has a normal mood and affect. Her speech is normal and behavior is normal. Thought content normal.  Nursing note and vitals reviewed.   Activities of Daily Living In your present state of health, do you have any difficulty performing the following activities: 08/25/2015  Hearing? N  Vision? N  Difficulty concentrating or making decisions? N  Walking or climbing stairs? Y  Dressing or bathing? N  Doing errands, shopping? N  Some recent data might be hidden    Fall Risk Assessment Fall Risk  11/24/2014  Falls in the past year? Yes  Risk for fall due to : History of fall(s);Impaired balance/gait;Impaired mobility       Depression Screen PHQ 2/9 Scores 08/25/2015 11/24/2014  PHQ - 2 Score 1 0    Cognitive Testing - 6-CIT   Correct? Score   What year is it? yes 0 Yes = 0    No = 4  What month is it? yes 0 Yes = 0    No = 3  Remember:     [address]ia [address], [place], [place]     What time is it? yes 0 Yes = 0    No = 3  Count backwards from 20 to 1 yes 0 Correct = 0    1 error = 2   More than 1 error = 4  Say the months of the year in reverse. yes 0 Correct = 0    1 error = 2   More than 1 error = 4  What address did I ask you to remember? yes 0 Correct = 0  1 error = 2    2 error = 4    3 error = 6    4 error = 8    All wrong = 10       TOTAL SCORE  0/28   Interpretation:  Normal  Normal (0-7) Abnormal (8-28)        Medicare Annual Wellness Visit Summary:  Reviewed patient's Family Medical History Reviewed and updated list of patient's medical providers Assessment of cognitive impairment was done Assessed patient's functional ability Established a written schedule for health screening [place] Completed and Reviewed  Exercise Activities and Dietary recommendations Goals    None      Immunization History  Administered Date(s) Administered  . Influenza,inj,Quad PF,36+ Mos 11/24/2014  . Pneumococcal Conjugate-13 11/24/2014  . Pneumococcal Polysaccharide-23 05/20/2012    Health Maintenance  Topic Date Due  . OPHTHALMOLOGY EXAM  08/05/1965  . HIV Screening  08/06/1970  . TETANUS/TDAP  08/06/1974  . COLONOSCOPY  02/21/2015  . PAP SMEAR  05/21/2015  . ZOSTAVAX  08/06/2015  . INFLUENZA VACCINE  09/20/2015  . HEMOGLOBIN A1C  09/25/2015  . FOOT EXAM  11/24/2015  . URINE MICROALBUMIN  11/24/2015  . Hepatitis C Screening  03/27/2020 (Originally [birth year])  . MAMMOGRAM  12/07/2016  . PNEUMOCOCCAL POLYSACCHARIDE VACCINE (2) 05/20/2017     Discussed health benefits of physical activity, and encouraged her to engage in regular exercise appropriate for her age and  condition.    ------------------------------------------------------------------------------------------------------------   Assessment & Plan:  1. Medicare annual wellness visit, subsequent Measures satisfied  2. Pulmonary emphysema, unspecified emphysema type ([place]) Patient smoking some again - encouraged cessation Continue inhalers as needed - CBC with Differential/Platelet  3. Uncontrolled type 2 diabetes mellitus with hyperosmolarity without coma, without long-term current  use of insulin ([place]) Remain off metformin - will notify regarding medications when labs return - Comprehensive metabolic panel - Hemoglobin A1c - Microalbumin / creatinine urine ratio - POCT urinalysis dipstick - TSH  4. Hyperlipidemia associated with type 2 diabetes mellitus ([place]) On statin therapy - Lipid panel  5. 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 Other osteoporosis without current pathological fracture No longer on Prolia Continue calcium and Vitamin D  7. Colon cancer screening - Ambulatory referral to Gastroenterology  8. Encounter for screening mammogram for breast cancer - MM DIGITAL SCREENING BILATERAL; Future  9. Need for influenza vaccination - Flu Vaccine QUAD 36+ mos IM   Halina Maidens, MD [facility]  11/25/2015

## 2015-11-26 LAB — CBC WITH DIFFERENTIAL/PLATELET
BASOS ABS: 0 10*3/uL (ref 0.0–0.2)
Basos: 0 %
EOS (ABSOLUTE): 0.1 10*3/uL (ref 0.0–0.4)
Eos: 1 %
HEMATOCRIT: 45.1 % (ref 34.0–46.6)
Hemoglobin: 15 g/dL (ref 11.1–15.9)
Immature Grans (Abs): 0 10*3/uL (ref 0.0–0.1)
Immature Granulocytes: 0 %
LYMPHS ABS: 3.1 10*3/uL (ref 0.7–3.1)
Lymphs: 34 %
MCH: 31.6 pg (ref 26.6–33.0)
MCHC: 33.3 g/dL (ref 31.5–35.7)
MCV: 95 fL (ref 79–97)
Monocytes Absolute: 0.7 10*3/uL (ref 0.1–0.9)
Monocytes: 7 %
NEUTROS ABS: 5.4 10*3/uL (ref 1.4–7.0)
Neutrophils: 58 %
PLATELETS: 247 10*3/uL (ref 150–379)
RBC: 4.74 x10E6/uL (ref 3.77–5.28)
RDW: 13.3 % (ref 12.3–15.4)
WBC: 9.3 10*3/uL (ref 3.4–10.8)

## 2015-11-26 LAB — LIPID PANEL
CHOL/HDL RATIO: 3.6 ratio (ref 0.0–4.4)
Cholesterol, Total: 204 mg/dL — ABNORMAL HIGH (ref 100–199)
HDL: 57 mg/dL (ref >39)
LDL CALC: 124 mg/dL — AB (ref 0–99)
Triglycerides: 117 mg/dL (ref 0–149)
VLDL Cholesterol Cal: 23 mg/dL (ref 5–40)

## 2015-11-26 LAB — COMPREHENSIVE METABOLIC PANEL
ALT: 17 IU/L (ref 0–32)
AST: 18 IU/L (ref 0–40)
Albumin/Globulin Ratio: 1.8 (ref 1.2–2.2)
Albumin: 4.2 g/dL (ref 3.6–4.8)
Alkaline Phosphatase: 82 IU/L (ref 39–117)
BUN/Creatinine Ratio: 23 (ref 12–28)
BUN: 12 mg/dL (ref 8–27)
Bilirubin Total: 0.3 mg/dL (ref 0.0–1.2)
CALCIUM: 9.3 mg/dL (ref 8.7–10.3)
CO2: 23 mmol/L (ref 18–29)
CREATININE: 0.52 mg/dL — AB (ref 0.57–1.00)
Chloride: 103 mmol/L (ref 96–106)
GFR calc Af Amer: 120 mL/min/{1.73_m2} (ref >59)
GFR, EST NON AFRICAN AMERICAN: 104 mL/min/{1.73_m2} (ref >59)
GLOBULIN, TOTAL: 2.3 g/dL (ref 1.5–4.5)
GLUCOSE: 139 mg/dL — AB (ref 65–99)
Potassium: 4.3 mmol/L (ref 3.5–5.2)
Sodium: 141 mmol/L (ref 134–144)
Total Protein: 6.5 g/dL (ref 6.0–8.5)

## 2015-11-26 LAB — TSH: TSH: 1.59 u[IU]/mL (ref 0.450–4.500)

## 2015-11-26 LAB — HEMOGLOBIN A1C
Est. average glucose Bld gHb Est-mCnc: 171 mg/dL
Hgb A1c MFr Bld: 7.6 % — ABNORMAL HIGH (ref 4.8–5.6)

## 2015-11-28 ENCOUNTER — Other Ambulatory Visit: Payer: Self-pay | Admitting: Internal Medicine

## 2015-11-29 LAB — MICROALBUMIN / CREATININE URINE RATIO: Creatinine, Urine: 9.8 mg/dL

## 2015-12-13 ENCOUNTER — Other Ambulatory Visit: Payer: Self-pay | Admitting: Internal Medicine

## 2015-12-13 ENCOUNTER — Ambulatory Visit
Admission: RE | Admit: 2015-12-13 | Discharge: 2015-12-13 | Disposition: A | Discharge status: Home / Self Care | Payer: Medicare HMO | Source: Ambulatory Visit | Attending: Internal Medicine | Admitting: Internal Medicine

## 2015-12-13 DIAGNOSIS — Z1231 Encounter for screening mammogram for malignant neoplasm of breast: Secondary | ICD-10-CM

## 2015-12-26 ENCOUNTER — Other Ambulatory Visit: Payer: Self-pay

## 2015-12-26 ENCOUNTER — Encounter: Payer: Self-pay | Admitting: Gastroenterology

## 2015-12-26 NOTE — Telephone Encounter (Signed)
Gastroenterology Pre-Procedure Review  Request Date: 01/05/16 Requesting Physician: Dr. Army Melia  PATIENT REVIEW QUESTIONS: The patient responded to the following health history questions as indicated:    1. Are you having any GI issues? no 2. Do you have a personal history of Polyps? no 3. Do you have a family history of Colon Cancer or Polyps? yes (mother - colon cancer) 4. Diabetes Mellitus? yes (Type 1) 5. Joint replacements in the past 12 months?yes (Total knee replacement on Right) 6. Major health problems in the past 3 months?no 7. Any artificial heart valves, MVP, or defibrillator?no    MEDICATIONS & ALLERGIES:    Patient reports the following regarding taking any anticoagulation/antiplatelet therapy:   Plavix, Coumadin, Eliquis, Xarelto, Lovenox, Pradaxa, Brilinta, or Effient? no Aspirin? no  Patient confirms/reports the following medications:  Current Outpatient Prescriptions  Medication Sig Dispense Refill  . albuterol (PROAIR HFA) 108 (90 BASE) MCG/ACT inhaler Inhale into the lungs.    Marland Kitchen atorvastatin (LIPITOR) 10 MG tablet Take 1 tablet (10 mg total) by mouth at bedtime. 90 tablet 1  . Fluticasone-Salmeterol (ADVAIR DISKUS) 250-50 MCG/DOSE AEPB Inhale 1 puff into the lungs 2 (two) times daily.    Marland Kitchen gabapentin (NEURONTIN) 600 MG tablet Take 1 tablet (600 mg total) by mouth 3 (three) times daily. 90 tablet 5  . glucose blood (BAYER CONTOUR TEST) test strip BAYER CONTOUR TEST (In Vitro Strip)  1 (one) Strip daily for 50 days  Quantity: 50;  Refills: 5   Ordered :[id number]  Halina Maidens M.D.;  Started [id number] Active Comments: dx: 250.02    . ibuprofen (ADVIL,MOTRIN) 200 MG tablet Take by mouth.    . metFORMIN (GLUCOPHAGE-XR) 500 MG 24 hr tablet Take 1 tablet (500 mg total) by mouth 2 (two) times daily. (Patient not taking: Reported on 11/25/2015) 180 tablet 1  . Multiple Vitamin (MULTIVITAMIN WITH MINERALS) TABS tablet Take 1 tablet by mouth daily.    Marland Kitchen omeprazole  (PRILOSEC) 20 MG capsule TAKE 1 CAPSULE BY MOUTH EVERY DAY 30 capsule 12  . oxyCODONE-acetaminophen (PERCOCET/ROXICET) 5-325 MG per tablet Take 1-2 tablets by mouth every 6 (six) hours as needed for pain. 7 tablet 0  . tiZANidine (ZANAFLEX) 4 MG tablet Take 4 mg by mouth every 6 (six) hours as needed.      No current facility-administered medications for this visit.     Patient confirms/reports the following allergies:  No Known Allergies  No orders of the defined types were placed in this encounter.   AUTHORIZATION INFORMATION Primary Insurance: 1D#: Group #:  Secondary Insurance: 1D#: Group #:  SCHEDULE INFORMATION: Date:  Time: Location:

## 2015-12-26 NOTE — Telephone Encounter (Signed)
error 

## 2015-12-27 ENCOUNTER — Other Ambulatory Visit: Payer: Self-pay

## 2015-12-27 MED ORDER — PEG 3350-KCL-NABCB-NACL-NASULF 236 G PO SOLR: ORAL | 0 refills | Status: DC

## 2016-01-02 ENCOUNTER — Encounter: Payer: Self-pay | Admitting: *Deleted

## 2016-01-03 ENCOUNTER — Encounter: Payer: Self-pay | Admitting: Anesthesiology

## 2016-01-04 ENCOUNTER — Telehealth: Payer: Self-pay | Admitting: Gastroenterology

## 2016-01-04 ENCOUNTER — Other Ambulatory Visit: Payer: Self-pay

## 2016-01-04 DIAGNOSIS — Z96651 Presence of right artificial knee joint: Secondary | ICD-10-CM | POA: Diagnosis not present

## 2016-01-04 NOTE — Telephone Encounter (Signed)
Patient needs to reschedule colonoscopy for tomorrow. She is sick and can't stomach the prep. She is going to the doctor this afternoon but would like for you to call her back and reschedule.

## 2016-01-04 NOTE — Telephone Encounter (Signed)
Pt rescheduled for a colonoscopy at [facility] on 01/19/16.

## 2016-01-05 ENCOUNTER — Ambulatory Visit: Admission: RE | Admit: 2016-01-05 | Payer: Medicare HMO | Source: Ambulatory Visit | Admitting: Gastroenterology

## 2016-01-05 HISTORY — DX: Emphysema, unspecified: J43.9

## 2016-01-05 HISTORY — DX: Unspecified osteoarthritis, unspecified site: M19.90

## 2016-01-05 HISTORY — DX: Gastro-esophageal reflux disease without esophagitis: K21.9

## 2016-01-05 HISTORY — DX: Hereditary spastic paraplegia: G11.4

## 2016-01-05 SURGERY — COLONOSCOPY WITH PROPOFOL
Anesthesia: Choice

## 2016-01-09 ENCOUNTER — Telehealth: Payer: Self-pay

## 2016-01-09 NOTE — Telephone Encounter (Signed)
Patient already has a colonoscopy scheduled with [place]. I will update her referral

## 2016-01-18 NOTE — Discharge Instructions (Signed)

## 2016-01-19 ENCOUNTER — Encounter: Admission: RE | Payer: Self-pay | Source: Ambulatory Visit

## 2016-01-19 ENCOUNTER — Ambulatory Visit: Admission: RE | Admit: 2016-01-19 | Source: Ambulatory Visit | Admitting: Gastroenterology

## 2016-01-19 SURGERY — COLONOSCOPY WITH PROPOFOL
Anesthesia: Choice

## 2016-01-23 ENCOUNTER — Other Ambulatory Visit: Payer: Self-pay

## 2016-02-06 DIAGNOSIS — M542 Cervicalgia: Secondary | ICD-10-CM | POA: Diagnosis not present

## 2016-02-06 DIAGNOSIS — G114 Hereditary spastic paraplegia: Secondary | ICD-10-CM | POA: Diagnosis not present

## 2016-02-06 DIAGNOSIS — M79672 Pain in left foot: Secondary | ICD-10-CM | POA: Diagnosis not present

## 2016-02-06 DIAGNOSIS — M545 Low back pain: Secondary | ICD-10-CM | POA: Diagnosis not present

## 2016-02-22 NOTE — Discharge Instructions (Signed)

## 2016-03-05 ENCOUNTER — Ambulatory Visit
Admission: RE | Admit: 2016-03-05 | Discharge: 2016-03-05 | Disposition: A | Discharge status: Home / Self Care | Payer: Medicare HMO | Source: Ambulatory Visit | Attending: Gastroenterology | Admitting: Gastroenterology

## 2016-03-05 ENCOUNTER — Ambulatory Visit: Payer: Medicare HMO | Admitting: Anesthesiology

## 2016-03-05 ENCOUNTER — Encounter
Admission: RE | Disposition: A | Discharge status: Home / Self Care | Payer: Self-pay | Source: Ambulatory Visit | Attending: Gastroenterology

## 2016-03-05 DIAGNOSIS — Z79899 Other long term (current) drug therapy: Secondary | ICD-10-CM | POA: Insufficient documentation

## 2016-03-05 DIAGNOSIS — K219 Gastro-esophageal reflux disease without esophagitis: Secondary | ICD-10-CM | POA: Diagnosis not present

## 2016-03-05 DIAGNOSIS — F17210 Nicotine dependence, cigarettes, uncomplicated: Secondary | ICD-10-CM | POA: Diagnosis not present

## 2016-03-05 DIAGNOSIS — K640 First degree hemorrhoids: Secondary | ICD-10-CM | POA: Diagnosis not present

## 2016-03-05 DIAGNOSIS — J449 Chronic obstructive pulmonary disease, unspecified: Secondary | ICD-10-CM | POA: Diagnosis not present

## 2016-03-05 DIAGNOSIS — E119 Type 2 diabetes mellitus without complications: Secondary | ICD-10-CM | POA: Diagnosis not present

## 2016-03-05 DIAGNOSIS — Z1211 Encounter for screening for malignant neoplasm of colon: Secondary | ICD-10-CM

## 2016-03-05 DIAGNOSIS — Z7982 Long term (current) use of aspirin: Secondary | ICD-10-CM | POA: Diagnosis not present

## 2016-03-05 DIAGNOSIS — M1990 Unspecified osteoarthritis, unspecified site: Secondary | ICD-10-CM | POA: Insufficient documentation

## 2016-03-05 DIAGNOSIS — J439 Emphysema, unspecified: Secondary | ICD-10-CM | POA: Insufficient documentation

## 2016-03-05 DIAGNOSIS — Z7984 Long term (current) use of oral hypoglycemic drugs: Secondary | ICD-10-CM | POA: Diagnosis not present

## 2016-03-05 DIAGNOSIS — D124 Benign neoplasm of descending colon: Secondary | ICD-10-CM | POA: Diagnosis not present

## 2016-03-05 DIAGNOSIS — R69 Illness, unspecified: Secondary | ICD-10-CM | POA: Diagnosis not present

## 2016-03-05 HISTORY — PX: COLONOSCOPY WITH PROPOFOL: SHX5780

## 2016-03-05 LAB — GLUCOSE, CAPILLARY: Glucose-Capillary: 140 mg/dL — ABNORMAL HIGH (ref 65–99)

## 2016-03-05 SURGERY — COLONOSCOPY WITH PROPOFOL
Anesthesia: Monitor Anesthesia Care | Wound class: Contaminated

## 2016-03-05 MED ORDER — STERILE WATER FOR IRRIGATION IR SOLN
Status: DC | PRN
  Administered 2016-03-05: 10:00:00

## 2016-03-05 MED ORDER — LACTATED RINGERS IV SOLN
INTRAVENOUS | Status: DC
  Administered 2016-03-05: 09:00:00 via INTRAVENOUS

## 2016-03-05 MED ORDER — PROPOFOL 10 MG/ML IV BOLUS
INTRAVENOUS | Status: DC | PRN
  Administered 2016-03-05: 100 mg via INTRAVENOUS
  Administered 2016-03-05 (×2): 50 mg via INTRAVENOUS

## 2016-03-05 MED ORDER — LIDOCAINE HCL (CARDIAC) 20 MG/ML IV SOLN
INTRAVENOUS | Status: DC | PRN
  Administered 2016-03-05: 40 mg via INTRAVENOUS

## 2016-03-05 SURGICAL SUPPLY — 23 items
CANISTER SUCT 1200ML W/VALVE (MISCELLANEOUS) ×2 IMPLANT
CLIP HMST 235XBRD CATH ROT (MISCELLANEOUS) IMPLANT
CLIP RESOLUTION 360 11X235 (MISCELLANEOUS)
FCP ESCP3.2XJMB 240X2.8X (MISCELLANEOUS)
FORCEPS BIOP RAD 4 LRG CAP 4 (CUTTING FORCEPS) IMPLANT
FORCEPS BIOP RJ4 240 W/NDL (MISCELLANEOUS)
FORCEPS ESCP3.2XJMB 240X2.8X (MISCELLANEOUS) IMPLANT
GOWN CVR UNV OPN BCK APRN NK (MISCELLANEOUS) ×2 IMPLANT
GOWN ISOL THUMB LOOP REG UNIV (MISCELLANEOUS) ×2
INJECTOR VARIJECT VIN23 (MISCELLANEOUS) IMPLANT
KIT DEFENDO VALVE AND CONN (KITS) IMPLANT
KIT ENDO PROCEDURE OLY (KITS) ×2 IMPLANT
MARKER SPOT ENDO TATTOO 5ML (MISCELLANEOUS) IMPLANT
PAD GROUND ADULT SPLIT (MISCELLANEOUS) IMPLANT
PROBE APC STR FIRE (PROBE) IMPLANT
RETRIEVER NET ROTH 2.5X230 LF (MISCELLANEOUS) IMPLANT
SNARE SHORT THROW 13M SML OVAL (MISCELLANEOUS) ×2 IMPLANT
SNARE SHORT THROW 30M LRG OVAL (MISCELLANEOUS) IMPLANT
SNARE SNG USE RND 15MM (INSTRUMENTS) IMPLANT
SPOT EX ENDOSCOPIC TATTOO (MISCELLANEOUS)
TRAP ETRAP POLY (MISCELLANEOUS) ×2 IMPLANT
VARIJECT INJECTOR VIN23 (MISCELLANEOUS)
WATER STERILE IRR 250ML POUR (IV SOLUTION) ×2 IMPLANT

## 2016-03-05 NOTE — Transfer of Care (Signed)
Immediate Anesthesia Transfer of Care Note  Patient: Hannah [name]  Procedure(s) Performed: Procedure(s): COLONOSCOPY WITH PROPOFOL (N/A)  Patient Location: PACU  Anesthesia Type: MAC  Level of Consciousness: awake, alert  and patient cooperative  Airway and Oxygen Therapy: Patient Spontanous Breathing and Patient connected to supplemental oxygen  Post-op Assessment: Post-op Vital signs reviewed, Patient's Cardiovascular Status Stable, Respiratory Function Stable, Patent Airway and No signs of Nausea or vomiting  Post-op Vital Signs: Reviewed and stable  Complications: No apparent anesthesia complications

## 2016-03-05 NOTE — Anesthesia Postprocedure Evaluation (Signed)
Anesthesia Post Note  Patient: Hannah [name]  Procedure(s) Performed: Procedure(s) (LRB): COLONOSCOPY WITH PROPOFOL (N/A)  Patient location during evaluation: PACU Anesthesia Type: MAC Level of consciousness: awake and alert Pain management: pain level controlled Vital Signs Assessment: post-procedure vital signs reviewed and stable Respiratory status: spontaneous breathing, nonlabored ventilation, respiratory function stable and patient connected to nasal cannula oxygen Cardiovascular status: stable and blood pressure returned to baseline Anesthetic complications: no    Oliviarose Punch

## 2016-03-05 NOTE — Op Note (Signed)
[facility] Gastroenterology Patient Name: Hannah [name] Procedure Date: 03/05/2016 9:18 AM MRN: [id number] Account #: [id number] Date of Birth: 06/21/1955 Admit Type: Outpatient Age: 61 Room: [facility] OR ROOM 01 Gender: Female Note Status: Finalized Procedure:            Colonoscopy Indications:          Screening for colorectal malignant neoplasm Providers:            [name] MD, MD Referring MD:         Halina Maidens, MD (Referring MD) Medicines:            Propofol per Anesthesia Complications:        No immediate complications. Procedure:            Pre-Anesthesia Assessment:                       - Prior to the procedure, a History and Physical was                        performed, and patient medications and allergies were                        reviewed. The patient's tolerance of previous                        anesthesia was also reviewed. The risks and benefits of                        the procedure and the sedation options and risks were                        discussed with the patient. All questions were                        answered, and informed consent was obtained. Prior                        Anticoagulants: The patient has taken no previous                        anticoagulant or antiplatelet agents. ASA Grade                        Assessment: II - A patient with mild systemic disease.                        After reviewing the risks and benefits, the patient was                        deemed in satisfactory condition to undergo the                        procedure.                       After obtaining informed consent, the colonoscope was                        passed under direct vision. Throughout the procedure,  the patient's blood pressure, pulse, and oxygen                        saturations were monitored continuously. The was                        introduced through the anus and advanced to the the                 cecum, identified by appendiceal orifice and ileocecal                        valve. The colonoscopy was performed without                        difficulty. The patient tolerated the procedure well.                        The quality of the bowel preparation was excellent. Findings:      The perianal and digital rectal examinations were normal.      Two sessile polyps were found in the descending colon. The polyps were 5       to 6 mm in size. These polyps were removed with a cold snare. Resection       and retrieval were complete.      Non-bleeding internal hemorrhoids were found during retroflexion. The       hemorrhoids were Grade I (internal hemorrhoids that do not prolapse). Impression:           - Two 5 to 6 mm polyps in the descending colon, removed                        with a cold snare. Resected and retrieved.                       - Non-bleeding internal hemorrhoids. Recommendation:       - Resume previous diet.                       - Discharge patient to home.                       - Continue present medications. Procedure Code(s):    --- Professional ---                       [phone], Colonoscopy, flexible; with removal of tumor(s),                        polyp(s), or other lesion(s) by snare technique Diagnosis Code(s):    --- Professional ---                       Z12.11, Encounter for screening for malignant neoplasm                        of colon                       D12.4, Benign neoplasm of descending colon CPT copyright 2016 American Medical Association. All rights reserved. The codes documented in this report are preliminary and upon coder review may  be revised to meet current  compliance requirements. [name] MD, MD 03/05/2016 9:37:43 AM This report has been signed electronically. Number of Addenda: 0 Note Initiated On: 03/05/2016 9:18 AM Scope Withdrawal Time: 0 hours 7 minutes 20 seconds  Total Procedure Duration: 0 hours 11 minutes 17  seconds       [facility]

## 2016-03-05 NOTE — Anesthesia Procedure Notes (Signed)
Procedure Name: MAC Date/Time: 03/05/2016 9:20 AM Performed by: Janna Arch Pre-anesthesia Checklist: Patient identified, Emergency Drugs available, Suction available and Patient being monitored Patient Re-evaluated:Patient Re-evaluated prior to inductionOxygen Delivery Method: Nasal cannula

## 2016-03-05 NOTE — H&P (Signed)
[name], MD [address] [place], [place] [address] Phone: [phone] Fax : [phone]  Primary Care Physician:  Halina Maidens, MD Primary Gastroenterologist:  Dr. Allen Norris  Pre-Procedure History & Physical: HPI:  Hannah [name] is a 61 y.o. female is here for a screening colonoscopy.   Past Medical History:  Diagnosis Date  . Arthritis   . Diabetes mellitus without complication ([place])   . Diet controlled gestational diabetes mellitus in puerperium   . Emphysema of lung ([place])   . 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 GERD (gastroesophageal reflux disease)     Past Surgical History:  Procedure Laterality Date  . HIP FRACTURE SURGERY Right 03/2013  . INNER EAR SURGERY Bilateral   . REPLACEMENT TOTAL KNEE Right 02/2015  . SHOULDER SURGERY Bilateral    rotator cuff    Prior to Admission medications   Medication Sig Start Date End Date Taking? Authorizing Provider  albuterol (PROAIR HFA) 108 (90 BASE) MCG/ACT inhaler Inhale into the lungs. 06/22/13   Historical Provider, MD  atorvastatin (LIPITOR) 10 MG tablet Take 1 tablet (10 mg total) by mouth at bedtime. 11/26/14   Glean Hess, MD  Chlorphen-Phenyleph-ASA (ALKA-SELTZER PLUS COLD PO) Take by mouth as needed.    Historical Provider, MD  Fluticasone-Salmeterol (ADVAIR DISKUS) 250-50 MCG/DOSE AEPB Inhale 1 puff into the lungs 2 (two) times daily. 06/22/13   Historical Provider, MD  gabapentin (NEURONTIN) 600 MG tablet Take 1 tablet (600 mg total) by mouth 3 (three) times daily. 08/25/15   Glean Hess, MD  glucose blood (BAYER CONTOUR TEST) test strip BAYER CONTOUR TEST (In Vitro Strip)  1 (one) Strip daily for 50 days  Quantity: 50;  Refills: 5   Ordered :[id number]  Halina Maidens M.D.;  Started 22-Jun-2013 Active Comments: dx: 250.02 06/22/13   Historical Provider, MD  ibuprofen (ADVIL,MOTRIN) 200 MG tablet Take by mouth.    Historical Provider, MD  metFORMIN (GLUCOPHAGE-XR) 500 MG 24 hr tablet Take 1  tablet (500 mg total) by mouth 2 (two) times daily. 11/26/14   Glean Hess, MD  Multiple Vitamin (MULTIVITAMIN WITH MINERALS) TABS tablet Take 1 tablet by mouth daily.    Historical Provider, MD  omeprazole (PRILOSEC) 20 MG capsule TAKE 1 CAPSULE BY MOUTH EVERY DAY 11/28/15   Glean Hess, MD  oxyCODONE-acetaminophen (PERCOCET/ROXICET) 5-325 MG per tablet Take 1-2 tablets by mouth every 6 (six) hours as needed for pain. 11/23/12   Carlisle Cater, PA-C  polyethylene glycol (GOLYTELY) 236 g solution Drink one 8 oz glass every 20 mins until stools are clear. 12/27/15   [name], MD  tiZANidine (ZANAFLEX) 4 MG tablet Take 4 mg by mouth every 6 (six) hours as needed.  10/01/12   Historical Provider, MD    Allergies as of 01/23/2016  . (No Known Allergies)    Family History  Problem Relation Age of Onset  . Cervical cancer Mother   . Breast cancer Sister 70  . Breast cancer Cousin 109    pat cousin    Social History   Social History  . Marital status: Married    Spouse name: N/A  . Number of children: N/A  . Years of education: N/A   Occupational History  . Not on file.   Social History Main Topics  . Smoking status: Current Every Day Smoker    Packs/day: 0.50    Types: Cigarettes  . Smokeless tobacco: Never Used  . Alcohol use No  . Drug  use: No  . Sexual activity: No   Other Topics Concern  . Not on file   Social History Narrative  . No narrative on file    Review of Systems: See HPI, otherwise negative ROS  Physical Exam: There were no vitals taken for this visit. General:   Alert,  pleasant and cooperative in NAD Head:  Normocephalic and atraumatic. Neck:  Supple; no masses or thyromegaly. Lungs:  Clear throughout to auscultation.    Heart:  Regular rate and rhythm. Abdomen:  Soft, nontender and nondistended. Normal bowel sounds, without guarding, and without rebound.   Neurologic:  Alert and  oriented x4;  grossly normal  neurologically.  Impression/Plan: Hannah [name] is now here to undergo a screening colonoscopy.  Risks, benefits, and alternatives regarding colonoscopy have been reviewed with the patient.  Questions have been answered.  All parties agreeable.

## 2016-03-05 NOTE — Anesthesia Preprocedure Evaluation (Addendum)
Anesthesia Evaluation  Patient identified by MRN, date of birth, ID band  Reviewed: NPO status   History of Anesthesia Complications Negative for: history of anesthetic complications  Airway Mallampati: II  TM Distance: >3 FB Neck ROM: full    Dental  (+) Missing, Poor Dentition,  Serveral missing; :   Pulmonary COPD (mild),  COPD inhaler, Current Smoker,    Pulmonary exam normal        Cardiovascular Exercise Tolerance: Good negative cardio ROS Normal cardiovascular exam  Lipids    Neuro/Psych 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;  DDD; chronic pain;   negative psych ROS   GI/Hepatic Neg liver ROS, GERD  Medicated,  Endo/Other  diabetes  Renal/GU negative Renal ROS  negative genitourinary   Musculoskeletal  (+) Arthritis ,   Abdominal   Peds  Hematology negative hematology ROS (+)   Anesthesia Other Findings Spastic paraplegia > avoid Succ  Reproductive/Obstetrics                            Anesthesia Physical Anesthesia Plan  ASA: III  Anesthesia Plan: MAC   Post-op Pain Management:    Induction:   Airway Management Planned:   Additional Equipment:   Intra-op Plan:   Post-operative Plan:   Informed Consent: I have reviewed the patients History and Physical, chart, labs and discussed the procedure including the risks, benefits and alternatives for the proposed anesthesia with the patient or authorized representative who has indicated his/her understanding and acceptance.     Plan Discussed with: CRNA  Anesthesia Plan Comments:         Anesthesia Quick Evaluation

## 2016-03-06 ENCOUNTER — Encounter: Payer: Self-pay | Admitting: Gastroenterology

## 2016-04-19 DIAGNOSIS — Z79899 Other long term (current) drug therapy: Secondary | ICD-10-CM | POA: Diagnosis not present

## 2016-04-19 DIAGNOSIS — G894 Chronic pain syndrome: Secondary | ICD-10-CM | POA: Diagnosis not present

## 2016-04-19 DIAGNOSIS — Z79891 Long term (current) use of opiate analgesic: Secondary | ICD-10-CM | POA: Diagnosis not present

## 2016-05-11 ENCOUNTER — Ambulatory Visit (INDEPENDENT_AMBULATORY_CARE_PROVIDER_SITE_OTHER): Payer: Medicare HMO | Admitting: Internal Medicine

## 2016-05-11 ENCOUNTER — Encounter: Payer: Self-pay | Admitting: Internal Medicine

## 2016-05-11 VITALS — BP 118/62 | HR 80 | Temp 98.6°F | Ht 65.0 in | Wt 146.0 lb

## 2016-05-11 DIAGNOSIS — J40 Bronchitis, not specified as acute or chronic: Secondary | ICD-10-CM

## 2016-05-11 MED ORDER — LEVOFLOXACIN 500 MG PO TABS: 500.0000 mg | ORAL_TABLET | Freq: Every day | ORAL | 0 refills | Status: DC

## 2016-05-11 NOTE — Patient Instructions (Signed)
Take Advair twice a day for the next 2 weeks  Use Albuterol inhaler every 4-6 hours as needed

## 2016-05-11 NOTE — Progress Notes (Signed)
Date:  05/11/2016   Name:  Hannah [name]   DOB:  12-26-1955   MRN:  [id number]   Chief Complaint: Cough (Green production. Ongoing cough won't go away. Started in 2022/04/14. No fever noticed since 2 weeks ago when had flu-like symptoms. ) Cough  This is a chronic problem. The current episode started more than 1 month ago (originally just clear phlegm). The problem has been gradually worsening (green sputum since last week). The problem occurs every few minutes. The cough is productive of sputum. Associated symptoms include myalgias, nasal congestion and shortness of breath. Pertinent negatives include no chest pain, chills, fever, rash, sore throat or wheezing. The symptoms are aggravated by exercise and lying down. Her past medical history is significant for COPD. and recent influenza  She is a former smoker - smoked briefly again in Mar 15, 2023 after her husband died but quit again in 04-14-22.  She has Advair and Albuterol but does not use them consistently.    Review of Systems  Constitutional: Positive for fatigue. Negative for chills, fever and unexpected weight change.  HENT: Positive for congestion and sinus pressure. Negative for sore throat and trouble swallowing.   Respiratory: Positive for cough and shortness of breath. Negative for chest tightness and wheezing.   Cardiovascular: Negative for chest pain, palpitations and leg swelling.  Gastrointestinal: Negative for abdominal pain, nausea and vomiting.  Musculoskeletal: Positive for gait problem and myalgias.  Skin: Negative for rash.  Neurological: Negative for dizziness.    Patient Active Problem List   Diagnosis Date Noted  . Special screening for malignant neoplasms, colon   . Benign neoplasm of descending colon   . Other osteoporosis without current pathological fracture 11/25/2015  . Hyperlipidemia associated with type 2 diabetes mellitus ([place]) 03/28/2015  . Dysphagia 11/11/2014  . 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 Diabetes mellitus type 2, uncontrolled ([place]) 11/11/2014  . Chronic obstructive pulmonary emphysema ([place]) 11/11/2014  . Tobacco use disorder, mild, 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  . Degenerative joint disease involving multiple joints 09/28/2013    Prior to Admission medications   Medication Sig Start Date End Date Taking? Authorizing Provider  albuterol (PROAIR HFA) 108 (90 BASE) MCG/ACT inhaler Inhale into the lungs. 06/22/13   Historical Provider, MD  atorvastatin (LIPITOR) 10 MG tablet Take 1 tablet (10 mg total) by mouth at bedtime. 11/26/14   Glean Hess, MD  Chlorphen-Phenyleph-ASA (ALKA-SELTZER PLUS COLD PO) Take by mouth as needed.    Historical Provider, MD  Fluticasone-Salmeterol (ADVAIR DISKUS) 250-50 MCG/DOSE AEPB Inhale 1 puff into the lungs 2 (two) times daily. 06/22/13   Historical Provider, MD  gabapentin (NEURONTIN) 600 MG tablet Take 1 tablet (600 mg total) by mouth 3 (three) times daily. 08/25/15   Glean Hess, MD  glucose blood (BAYER CONTOUR TEST) test strip BAYER CONTOUR TEST (In Vitro Strip)  1 (one) Strip daily for 50 days  Quantity: 50;  Refills: 5   Ordered :[id number]  Halina Maidens M.D.;  Started 22-Jun-2013 Active Comments: dx: 250.02 06/22/13   Historical Provider, MD  ibuprofen (ADVIL,MOTRIN) 200 MG tablet Take by mouth.    Historical Provider, MD  metFORMIN (GLUCOPHAGE-XR) 500 MG 24 hr tablet Take 1 tablet (500 mg total) by mouth 2 (two) times daily. 11/26/14   Glean Hess, MD  Multiple Vitamin (MULTIVITAMIN WITH MINERALS) TABS tablet Take 1 tablet by mouth daily.    Historical Provider, MD  omeprazole (PRILOSEC) 20 MG capsule TAKE 1 CAPSULE  BY MOUTH EVERY DAY 11/28/15   Glean Hess, MD  oxyCODONE-acetaminophen (PERCOCET/ROXICET) 5-325 MG per tablet Take 1-2 tablets by mouth every 6 (six) hours as needed for pain. 11/23/12   Carlisle Cater, PA-C  polyethylene glycol (GOLYTELY) 236 g solution Drink one 8 oz glass every  20 mins until stools are clear. 12/27/15   [name], MD  tiZANidine (ZANAFLEX) 4 MG tablet Take 4 mg by mouth every 6 (six) hours as needed.  10/01/12   Historical Provider, MD    No Known Allergies  Past Surgical History:  Procedure Laterality Date  . COLONOSCOPY WITH PROPOFOL N/A 03/05/2016   Procedure: COLONOSCOPY WITH PROPOFOL;  Surgeon: [name], MD;  Location: [facility];  Service: Endoscopy;  Laterality: N/A;  . HIP FRACTURE SURGERY Right 03/2013  . INNER EAR SURGERY Bilateral   . REPLACEMENT TOTAL KNEE Right 02/2015  . SHOULDER SURGERY Bilateral    rotator cuff    Social History  Substance Use Topics  . Smoking status: Current Every Day Smoker    Packs/day: 0.50    Types: Cigarettes  . Smokeless tobacco: Never Used  . Alcohol use No     Medication list has been reviewed and updated.   Physical Exam  Constitutional: She is oriented to person, place, and time. She appears well-developed. No distress.  HENT:  Head: Normocephalic and atraumatic.  Cardiovascular: Normal rate, regular rhythm and normal heart sounds.   Pulmonary/Chest: Effort normal. No respiratory distress. She has decreased breath sounds. She has no wheezes. She has no rhonchi.  Neurological: She is alert and oriented to person, place, and time. Gait (due to spastic paraplegia - uses Rollator) abnormal.  Skin: Skin is warm and dry. No rash noted.  Psychiatric: Her behavior is normal. Thought content normal. She exhibits a depressed mood.  Nursing note and vitals reviewed.   BP 118/62 (BP Location: Left Arm, Patient Position: Sitting, Cuff Size: Normal)   Pulse 80   Temp 98.6 F (37 C)   Ht 5\' 5"  (1.651 m)   Wt 146 lb (66.2 kg)   SpO2 95%   BMI 24.30 kg/m   Assessment and Plan: 1. Bronchitis Use Advair bid Albuterol every 4-6 hours as needed Over the counter cold or sinus medication such as Alka-seltzer   Meds ordered this encounter  Medications  . levofloxacin (LEVAQUIN) 500  MG tablet    Sig: Take 1 tablet (500 mg total) by mouth daily.    Dispense:  7 tablet    Refill:  0    Halina Maidens, MD [facility]  05/11/2016

## 2016-05-14 ENCOUNTER — Telehealth: Payer: Self-pay

## 2016-05-14 ENCOUNTER — Other Ambulatory Visit: Payer: Self-pay | Admitting: Internal Medicine

## 2016-05-14 MED ORDER — AMOXICILLIN-POT CLAVULANATE 875-125 MG PO TABS: 1.0000 | ORAL_TABLET | Freq: Two times a day (BID) | ORAL | 0 refills | Status: DC

## 2016-05-14 NOTE — Telephone Encounter (Signed)
Sent Augmentin.

## 2016-05-14 NOTE — Telephone Encounter (Signed)
Pt informed

## 2016-05-14 NOTE — Telephone Encounter (Signed)
Pt called stating after starting to take Levofloxacin her GERD became worse and she starting vomiting. Pt would like to try different Antibiotic for her Bronchitis. Send to [facility] in [place].

## 2016-07-19 DIAGNOSIS — G894 Chronic pain syndrome: Secondary | ICD-10-CM | POA: Diagnosis not present

## 2016-09-18 ENCOUNTER — Telehealth: Payer: Self-pay

## 2016-09-18 DIAGNOSIS — M5416 Radiculopathy, lumbar region: Secondary | ICD-10-CM | POA: Diagnosis not present

## 2016-09-18 DIAGNOSIS — Z79891 Long term (current) use of opiate analgesic: Secondary | ICD-10-CM | POA: Diagnosis not present

## 2016-09-18 DIAGNOSIS — G894 Chronic pain syndrome: Secondary | ICD-10-CM | POA: Diagnosis not present

## 2016-09-18 NOTE — Telephone Encounter (Signed)
Pt called requested refill on Metformin- which she negan taking herself after her sugar spiking for 2 months now. States she only has 4-5 days left. I told her Dr Army Melia needs to see her every 6 months for Follow ups and in order to get a refill she would need to be seen by Dr Army Melia to discuss. She understood and I transferred her up front for follow up to be made.

## 2016-10-02 DIAGNOSIS — M47817 Spondylosis without myelopathy or radiculopathy, lumbosacral region: Secondary | ICD-10-CM | POA: Diagnosis not present

## 2016-10-04 ENCOUNTER — Ambulatory Visit (INDEPENDENT_AMBULATORY_CARE_PROVIDER_SITE_OTHER): Payer: Medicare HMO | Admitting: Internal Medicine

## 2016-10-04 ENCOUNTER — Encounter: Payer: Self-pay | Admitting: Internal Medicine

## 2016-10-04 VITALS — BP 138/66 | HR 46 | Ht 65.0 in | Wt 156.8 lb

## 2016-10-04 DIAGNOSIS — E1100 Type 2 diabetes mellitus with hyperosmolarity without nonketotic hyperglycemic-hyperosmolar coma (NKHHC): Secondary | ICD-10-CM | POA: Diagnosis not present

## 2016-10-04 DIAGNOSIS — J439 Emphysema, unspecified: Secondary | ICD-10-CM | POA: Diagnosis not present

## 2016-10-04 DIAGNOSIS — E785 Hyperlipidemia, unspecified: Secondary | ICD-10-CM

## 2016-10-04 DIAGNOSIS — G114 Hereditary spastic paraplegia: Secondary | ICD-10-CM | POA: Diagnosis not present

## 2016-10-04 DIAGNOSIS — D485 Neoplasm of uncertain behavior of skin: Secondary | ICD-10-CM | POA: Diagnosis not present

## 2016-10-04 DIAGNOSIS — E1169 Type 2 diabetes mellitus with other specified complication: Secondary | ICD-10-CM | POA: Diagnosis not present

## 2016-10-04 MED ORDER — ATORVASTATIN CALCIUM 10 MG PO TABS: 10.0000 mg | ORAL_TABLET | Freq: Every day | ORAL | 1 refills | Status: DC

## 2016-10-04 NOTE — Patient Instructions (Addendum)
Need Diabetic eye exam yearly!  See Dermatology -   DASH Eating Plan DASH stands for "Dietary Approaches to Stop Hypertension." The DASH eating plan is a healthy eating plan that has been shown to reduce high blood pressure (hypertension). It may also reduce your risk for type 2 diabetes, heart disease, and stroke. The DASH eating plan may also help with weight loss. What are tips for following this plan? General guidelines  Avoid eating more than 2,300 mg (milligrams) of salt (sodium) a day. If you have hypertension, you may need to reduce your sodium intake to 1,500 mg a day.  Limit alcohol intake to no more than 1 drink a day for nonpregnant women and 2 drinks a day for men. One drink equals 12 oz of beer, 5 oz of wine, or 1 oz of hard liquor.  Work with your health care provider to maintain a healthy body weight or to lose weight. Ask what an ideal weight is for you.  Get at least 30 minutes of exercise that causes your heart to beat faster (aerobic exercise) most days of the week. Activities may include walking, swimming, or biking.  Work with your health care provider or diet and nutrition specialist (dietitian) to adjust your eating plan to your individual calorie needs. Reading food labels  Check food labels for the amount of sodium per serving. Choose foods with less than 5 percent of the Daily Value of sodium. Generally, foods with less than 300 mg of sodium per serving fit into this eating plan.  To find whole grains, look for the word "whole" as the first word in the ingredient list. Shopping  Buy products labeled as "low-sodium" or "no salt added."  Buy fresh foods. Avoid canned foods and premade or frozen meals. Cooking  Avoid adding salt when cooking. Use salt-free seasonings or herbs instead of table salt or sea salt. Check with your health care provider or pharmacist before using salt substitutes.  Do not fry foods. Cook foods using healthy methods such as baking,  boiling, grilling, and broiling instead.  Cook with heart-healthy oils, such as olive, canola, soybean, or sunflower oil. Meal planning   Eat a balanced diet that includes: ? 5 or more servings of fruits and vegetables each day. At each meal, try to fill half of your plate with fruits and vegetables. ? Up to 6-8 servings of whole grains each day. ? Less than 6 oz of lean meat, poultry, or fish each day. A 3-oz serving of meat is about the same size as a deck of cards. One egg equals 1 oz. ? 2 servings of low-fat dairy each day. ? A serving of nuts, seeds, or beans 5 times each week. ? Heart-healthy fats. Healthy fats called Omega-3 fatty acids are found in foods such as flaxseeds and coldwater fish, like sardines, salmon, and mackerel.  Limit how much you eat of the following: ? Canned or prepackaged foods. ? Food that is high in trans fat, such as fried foods. ? Food that is high in saturated fat, such as fatty meat. ? Sweets, desserts, sugary drinks, and other foods with added sugar. ? Full-fat dairy products.  Do not salt foods before eating.  Try to eat at least 2 vegetarian meals each week.  Eat more home-cooked food and less restaurant, buffet, and fast food.  When eating at a restaurant, ask that your food be prepared with less salt or no salt, if possible. What foods are recommended? The items listed may  not be a complete list. Talk with your dietitian about what dietary choices are best for you. Grains Whole-grain or whole-wheat bread. Whole-grain or whole-wheat pasta. Brown rice. [place]. Bulgur. Whole-grain and low-sodium cereals. Pita bread. Low-fat, low-sodium crackers. Whole-wheat flour tortillas. Vegetables Fresh or frozen vegetables (raw, steamed, roasted, or grilled). Low-sodium or reduced-sodium tomato and vegetable juice. Low-sodium or reduced-sodium tomato sauce and tomato paste. Low-sodium or reduced-sodium canned vegetables. Fruits All fresh, dried, or  frozen fruit. Canned fruit in natural juice (without added sugar). Meat and other protein foods Skinless chicken or [place]. Ground chicken or [place]. Pork with fat trimmed off. Fish and seafood. Egg whites. Dried beans, peas, or lentils. Unsalted nuts, nut butters, and seeds. Unsalted canned beans. Lean cuts of beef with fat trimmed off. Low-sodium, lean deli meat. Dairy Low-fat (1%) or fat-free (skim) milk. Fat-free, low-fat, or reduced-fat cheeses. Nonfat, low-sodium ricotta or cottage cheese. Low-fat or nonfat yogurt. Low-fat, low-sodium cheese. Fats and oils Soft margarine without trans fats. Vegetable oil. Low-fat, reduced-fat, or light mayonnaise and salad dressings (reduced-sodium). Canola, safflower, olive, soybean, and sunflower oils. Avocado. Seasoning and other foods Herbs. Spices. Seasoning mixes without salt. Unsalted popcorn and pretzels. Fat-free sweets. What foods are not recommended? The items listed may not be a complete list. Talk with your dietitian about what dietary choices are best for you. Grains Baked goods made with fat, such as croissants, muffins, or some breads. Dry pasta or rice meal packs. Vegetables Creamed or fried vegetables. Vegetables in a cheese sauce. Regular canned vegetables (not low-sodium or reduced-sodium). Regular canned tomato sauce and paste (not low-sodium or reduced-sodium). Regular tomato and vegetable juice (not low-sodium or reduced-sodium). Angie Fava. Olives. Fruits Canned fruit in a light or heavy syrup. Fried fruit. Fruit in cream or butter sauce. Meat and other protein foods Fatty cuts of meat. Ribs. Fried meat. Berniece Salines. Sausage. Bologna and other processed lunch meats. Salami. Fatback. Hotdogs. Bratwurst. Salted nuts and seeds. Canned beans with added salt. Canned or smoked fish. Whole eggs or egg yolks. Chicken or [place] with skin. Dairy Whole or 2% milk, cream, and half-and-half. Whole or full-fat cream cheese. Whole-fat or sweetened yogurt.  Full-fat cheese. Nondairy creamers. Whipped toppings. Processed cheese and cheese spreads. Fats and oils Butter. Stick margarine. Lard. Shortening. Ghee. Bacon fat. Tropical oils, such as coconut, palm kernel, or palm oil. Seasoning and other foods Salted popcorn and pretzels. Onion salt, garlic salt, seasoned salt, table salt, and sea salt. Worcestershire sauce. Tartar sauce. Barbecue sauce. Teriyaki sauce. Soy sauce, including reduced-sodium. Steak sauce. Canned and packaged gravies. Fish sauce. Oyster sauce. Cocktail sauce. Horseradish that you find on the shelf. Ketchup. Mustard. Meat flavorings and tenderizers. Bouillon cubes. Hot sauce and Tabasco sauce. Premade or packaged marinades. Premade or packaged taco seasonings. Relishes. Regular salad dressings. Where to find more information:  National Heart, Lung, and [place]: https://wilson-eaton.com/  American Heart Association: www.heart.org Summary  The DASH eating plan is a healthy eating plan that has been shown to reduce high blood pressure (hypertension). It may also reduce your risk for type 2 diabetes, heart disease, and stroke.  With the DASH eating plan, you should limit salt (sodium) intake to 2,300 mg a day. If you have hypertension, you may need to reduce your sodium intake to 1,500 mg a day.  When on the DASH eating plan, aim to eat more fresh fruits and vegetables, whole grains, lean proteins, low-fat dairy, and heart-healthy fats.  Work with your health care provider or diet and  nutrition specialist (dietitian) to adjust your eating plan to your individual calorie needs. This information is not intended to replace advice given to you by your health care provider. Make sure you discuss any questions you have with your health care provider. Document Released: 01/25/2011 Document Revised: 01/30/2016 Document Reviewed: 01/30/2016 Elsevier Interactive Patient Education  2017 [employer].

## 2016-10-04 NOTE — Progress Notes (Signed)
Date:  10/04/2016   Name:  Hannah [name]   DOB:  1955/04/02   MRN:  [id number]   Chief Complaint: Diabetes (Stopped taking metformin around March of this year. States she has been controlling BS with diet. BS Monday was 157.) and Nevus (Had Nerve Block done 2 days ago and was told to bring to attention of PCP about moles on back. )  Diabetes  She presents for her follow-up diabetic visit. She has type 2 diabetes mellitus. Her disease course has been fluctuating. Pertinent negatives for hypoglycemia include no dizziness, headaches or tremors. Pertinent negatives for diabetes include no chest pain, no fatigue, no polydipsia and no polyuria. Symptoms are stable. Current diabetic treatment includes diet (she decided to try stopping metformin on her own). Her breakfast blood glucose is taken between 6-7 am. Her breakfast blood glucose range is generally 140-180 mg/dl.  Hyperlipidemia  This is a chronic problem. The problem is uncontrolled. Associated symptoms include myalgias and shortness of breath. Pertinent negatives include no chest pain. She is currently on no antihyperlipidemic treatment (supposed to be on lipitor but not taking it).  Moles - she was told at the time of her injection last week that she had several moles on her back to need to be looked at. She has not aware of any symptoms such as itching bleeding or pain. She has no history of skin cancers and has not seen a dermatologist.  COPD -  Still not smoking and only using Advair as needed.  Spastic paraplegia - using a Rollator for ambulation. She's had several joint surgeries have improved her function. She does exercises regularly. She's also had a recent lumbar injection with a small dose of cortisone.  Lab Results  Component Value Date   HGBA1C 7.6 (H) 11/25/2015   Lab Results  Component Value Date   CHOL 204 (H) 11/25/2015   HDL 57 11/25/2015   LDLCALC 124 (H) 11/25/2015   TRIG 117 11/25/2015   CHOLHDL 3.6 11/25/2015      Review of Systems  Constitutional: Negative for appetite change, fatigue, fever and unexpected weight change.  HENT: Negative for tinnitus and trouble swallowing.   Eyes: Negative for visual disturbance.  Respiratory: Positive for shortness of breath and wheezing. Negative for cough and chest tightness.   Cardiovascular: Negative for chest pain, palpitations and leg swelling.  Gastrointestinal: Negative for abdominal pain.  Endocrine: Negative for polydipsia and polyuria.  Genitourinary: Negative for dysuria and hematuria.  Musculoskeletal: Positive for arthralgias, gait problem and myalgias.  Skin: Negative for color change, rash and wound.  Neurological: Negative for dizziness, tremors, numbness and headaches.  Psychiatric/Behavioral: Negative for dysphoric mood.    Patient Active Problem List   Diagnosis Date Noted  . Special screening for malignant neoplasms, colon   . Benign neoplasm of descending colon   . Other osteoporosis without current pathological fracture 11/25/2015  . Hyperlipidemia associated with type 2 diabetes mellitus ([place]) 03/28/2015  . Dysphagia 11/11/2014  . 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 Diabetes mellitus type 2, uncontrolled ([place]) 11/11/2014  . Chronic obstructive pulmonary emphysema ([place]) 11/11/2014  . Tobacco use disorder, mild, 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  . Degenerative joint disease involving multiple joints 09/28/2013    Prior to Admission medications   Medication Sig Start Date End Date Taking? Authorizing Provider  albuterol (PROAIR HFA) 108 (90 BASE) MCG/ACT inhaler Inhale into the lungs. 06/22/13  Yes [provider]  atorvastatin (LIPITOR) 10 MG tablet Take  1 tablet (10 mg total) by mouth at bedtime. 11/26/14  Yes Glean Hess, MD  Fluticasone-Salmeterol (ADVAIR DISKUS) 250-50 MCG/DOSE AEPB Inhale 1 puff into the lungs 2 (two) times daily. 06/22/13  Yes [provider]  gabapentin  (NEURONTIN) 600 MG tablet Take 1 tablet (600 mg total) by mouth 3 (three) times daily. 08/25/15  Yes Glean Hess, MD  glucose blood (BAYER CONTOUR TEST) test strip BAYER CONTOUR TEST (In Vitro Strip)  1 (one) Strip daily for 50 days  Quantity: 50;  Refills: 5   Ordered :[id number]  Halina Maidens M.D.;  Started 22-Jun-2013 Active Comments: dx: 250.02 06/22/13  Yes [provider]  ibuprofen (ADVIL,MOTRIN) 200 MG tablet Take by mouth.   Yes [provider]  Multiple Vitamin (MULTIVITAMIN WITH MINERALS) TABS tablet Take 1 tablet by mouth daily.   Yes [provider]  omeprazole (PRILOSEC) 20 MG capsule TAKE 1 CAPSULE BY MOUTH EVERY DAY 11/28/15  Yes Glean Hess, MD  oxyCODONE-acetaminophen (PERCOCET/ROXICET) 5-325 MG per tablet Take 1-2 tablets by mouth every 6 (six) hours as needed for pain. 11/23/12  Yes Carlisle Cater, PA-C  tiZANidine (ZANAFLEX) 4 MG tablet Take 4 mg by mouth every 6 (six) hours as needed.  10/01/12  Yes [provider]    Allergies  Allergen Reactions  . Levofloxacin Nausea And Vomiting    Past Surgical History:  Procedure Laterality Date  . COLONOSCOPY WITH PROPOFOL N/A 03/05/2016   Procedure: COLONOSCOPY WITH PROPOFOL;  Surgeon: [name], MD;  Location: [facility];  Service: Endoscopy;  Laterality: N/A;  . HIP FRACTURE SURGERY Right 03/2013  . INNER EAR SURGERY Bilateral   . REPLACEMENT TOTAL KNEE Right 02/2015  . SHOULDER SURGERY Bilateral    rotator cuff    Social History  Substance Use Topics  . Smoking status: Former Smoker    Packs/day: 0.50    Types: Cigarettes    Quit date: 03/05/2016  . Smokeless tobacco: Never Used  . Alcohol use No   Depression screen [facility] 2/9 10/04/2016 08/25/2015 11/24/2014  Decreased Interest 0 0 0  Down, Depressed, Hopeless 1 1 0  PHQ - 2 Score 1 1 0     Medication list has been reviewed and updated.   Physical Exam  Constitutional: She is oriented to person, place, and  time. She appears well-developed. No distress.  HENT:  Head: Normocephalic and atraumatic.  Cardiovascular: Normal rate, regular rhythm and normal heart sounds.   Pulmonary/Chest: Effort normal. No respiratory distress. She has decreased breath sounds. She has no wheezes.  Neurological: She is alert and oriented to person, place, and time.  Skin: Skin is warm and dry. No rash noted.  Multiple freckles and keratoses over back, chest One irregular nevus right flank ~ 2 mm size  Psychiatric: She has a normal mood and affect. Her behavior is normal. Thought content normal.  Nursing note and vitals reviewed.   BP 138/66   Pulse (!) 46   Ht 5\' 5"  (1.651 m)   Wt 156 lb 12.8 oz (71.1 kg)   SpO2 98%   BMI 26.09 kg/m   Assessment and Plan: 1. Uncontrolled type 2 diabetes mellitus with hyperosmolarity without coma, without long-term current use of insulin ([place]) Check labs - may need to resume metformin - CBC with Differential/Platelet - Comprehensive metabolic panel - Hemoglobin A1c - TSH  2. Hyperlipidemia associated with type 2 diabetes mellitus ([facility]) Resume statin therapy - Lipid panel - atorvastatin (LIPITOR) 10 MG tablet; Take  1 tablet (10 mg total) by mouth at bedtime. (Patient not taking: Reported on 10/04/2016)  Dispense: 90 tablet; Refill: 1  3. Neoplasm of uncertain behavior of skin of back - Ambulatory referral to Dermatology  4. 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 Pulmonary emphysema, unspecified emphysema type ([place]) Stable; continue inhalers but recommend Advair daily   Meds ordered this encounter  Medications  . atorvastatin (LIPITOR) 10 MG tablet    Sig: Take 1 tablet (10 mg total) by mouth at bedtime.    Dispense:  90 tablet    Refill:  [place], MD [facility]  10/04/2016

## 2016-10-05 LAB — CBC WITH DIFFERENTIAL/PLATELET
BASOS ABS: 0 10*3/uL (ref 0.0–0.2)
Basos: 0 %
EOS (ABSOLUTE): 0.1 10*3/uL (ref 0.0–0.4)
Eos: 1 %
HEMOGLOBIN: 15.2 g/dL (ref 11.1–15.9)
Hematocrit: 44.9 % (ref 34.0–46.6)
Immature Grans (Abs): 0.1 10*3/uL (ref 0.0–0.1)
Immature Granulocytes: 1 %
LYMPHS ABS: 3.4 10*3/uL — AB (ref 0.7–3.1)
Lymphs: 26 %
MCH: 31 pg (ref 26.6–33.0)
MCHC: 33.9 g/dL (ref 31.5–35.7)
MCV: 91 fL (ref 79–97)
MONOCYTES: 6 %
Monocytes Absolute: 0.8 10*3/uL (ref 0.1–0.9)
Neutrophils Absolute: 8.8 10*3/uL — ABNORMAL HIGH (ref 1.4–7.0)
Neutrophils: 66 %
Platelets: 259 10*3/uL (ref 150–379)
RBC: 4.91 x10E6/uL (ref 3.77–5.28)
RDW: 13.7 % (ref 12.3–15.4)
WBC: 13.1 10*3/uL — ABNORMAL HIGH (ref 3.4–10.8)

## 2016-10-05 LAB — COMPREHENSIVE METABOLIC PANEL
A/G RATIO: 1.8 (ref 1.2–2.2)
ALBUMIN: 4.6 g/dL (ref 3.6–4.8)
ALK PHOS: 87 IU/L (ref 39–117)
ALT: 15 IU/L (ref 0–32)
AST: 14 IU/L (ref 0–40)
BILIRUBIN TOTAL: 0.2 mg/dL (ref 0.0–1.2)
BUN / CREAT RATIO: 18 (ref 12–28)
BUN: 10 mg/dL (ref 8–27)
CHLORIDE: 101 mmol/L (ref 96–106)
CO2: 24 mmol/L (ref 20–29)
CREATININE: 0.57 mg/dL (ref 0.57–1.00)
Calcium: 9.8 mg/dL (ref 8.7–10.3)
GFR, EST AFRICAN AMERICAN: 116 mL/min/{1.73_m2} (ref >59)
GFR, EST NON AFRICAN AMERICAN: 100 mL/min/{1.73_m2} (ref >59)
GLOBULIN, TOTAL: 2.6 g/dL (ref 1.5–4.5)
GLUCOSE: 127 mg/dL — AB (ref 65–99)
Potassium: 4.7 mmol/L (ref 3.5–5.2)
Sodium: 141 mmol/L (ref 134–144)
Total Protein: 7.2 g/dL (ref 6.0–8.5)

## 2016-10-05 LAB — LIPID PANEL
Chol/HDL Ratio: 3.7 ratio (ref 0.0–4.4)
Cholesterol, Total: 258 mg/dL — ABNORMAL HIGH (ref 100–199)
HDL: 69 mg/dL (ref >39)
LDL Calculated: 167 mg/dL — ABNORMAL HIGH (ref 0–99)
Triglycerides: 109 mg/dL (ref 0–149)
VLDL Cholesterol Cal: 22 mg/dL (ref 5–40)

## 2016-10-05 LAB — TSH: TSH: 1.36 u[IU]/mL (ref 0.450–4.500)

## 2016-10-05 LAB — HEMOGLOBIN A1C
ESTIMATED AVERAGE GLUCOSE: 163 mg/dL
Hgb A1c MFr Bld: 7.3 % — ABNORMAL HIGH (ref 4.8–5.6)

## 2016-10-17 DIAGNOSIS — L578 Other skin changes due to chronic exposure to nonionizing radiation: Secondary | ICD-10-CM | POA: Diagnosis not present

## 2016-10-17 DIAGNOSIS — L821 Other seborrheic keratosis: Secondary | ICD-10-CM | POA: Diagnosis not present

## 2016-10-17 DIAGNOSIS — L570 Actinic keratosis: Secondary | ICD-10-CM | POA: Diagnosis not present

## 2016-11-15 DIAGNOSIS — G894 Chronic pain syndrome: Secondary | ICD-10-CM | POA: Diagnosis not present

## 2016-11-15 DIAGNOSIS — M5416 Radiculopathy, lumbar region: Secondary | ICD-10-CM | POA: Diagnosis not present

## 2016-11-15 DIAGNOSIS — M47816 Spondylosis without myelopathy or radiculopathy, lumbar region: Secondary | ICD-10-CM | POA: Diagnosis not present

## 2016-11-19 DIAGNOSIS — M545 Low back pain: Secondary | ICD-10-CM | POA: Diagnosis not present

## 2016-12-24 ENCOUNTER — Telehealth: Payer: Self-pay

## 2016-12-24 ENCOUNTER — Ambulatory Visit (INDEPENDENT_AMBULATORY_CARE_PROVIDER_SITE_OTHER): Payer: Medicare HMO

## 2016-12-24 VITALS — BP 108/60 | HR 60 | Temp 98.2°F | Resp 16 | Ht 65.0 in | Wt 160.2 lb

## 2016-12-24 DIAGNOSIS — Z23 Encounter for immunization: Secondary | ICD-10-CM

## 2016-12-24 DIAGNOSIS — Z0000 Encounter for general adult medical examination without abnormal findings: Secondary | ICD-10-CM

## 2016-12-24 DIAGNOSIS — Z114 Encounter for screening for human immunodeficiency virus [HIV]: Secondary | ICD-10-CM | POA: Diagnosis not present

## 2016-12-24 DIAGNOSIS — Z1231 Encounter for screening mammogram for malignant neoplasm of breast: Secondary | ICD-10-CM

## 2016-12-24 DIAGNOSIS — Z1239 Encounter for other screening for malignant neoplasm of breast: Secondary | ICD-10-CM

## 2016-12-24 DIAGNOSIS — R69 Illness, unspecified: Secondary | ICD-10-CM | POA: Diagnosis not present

## 2016-12-24 MED ORDER — ALBUTEROL SULFATE HFA 108 (90 BASE) MCG/ACT IN AERS: 2.0000 | INHALATION_SPRAY | Freq: Four times a day (QID) | RESPIRATORY_TRACT | 2 refills | Status: DC | PRN

## 2016-12-24 MED ORDER — FLUTICASONE-SALMETEROL 250-50 MCG/DOSE IN AEPB: 1.0000 | INHALATION_SPRAY | Freq: Two times a day (BID) | RESPIRATORY_TRACT | 2 refills | Status: DC

## 2016-12-24 NOTE — Patient Instructions (Signed)
[name] , Thank you for taking time to come for your Medicare Wellness Visit. I appreciate your ongoing commitment to your health goals. Please review the following plan we discussed and let me know if I can assist you in the future.   Screening recommendations/referrals: Colonoscopy: Completed 03/05/16. Repeat colonoscopy in 5 years Mammogram: Ordered today. Please call [phone] to schedule your mammogram.  Bone Density: Completed at [facility]. Results cannot be located in your chart. Please provide a copy of these records. Recommended yearly ophthalmology/optometry visit for glaucoma screening and checkup Recommended yearly dental visit for hygiene and checkup  Vaccinations: Influenza vaccine: Given today Pneumococcal vaccine: Pneumovax 23 due after age 28 Tdap vaccine: Declined. Please call your insurance company to determine your out of pocket expense. Shingles vaccine: Declined. Please call your insurance company to determine your out of pocket expense.  Advanced directives: Advance directive discussed with you today. Even though you declined this today please call our office should you change your mind and we can give you the proper paperwork for you to fill out.  Conditions/risks identified: Fall risk prevention discussed  Next appointment: You are scheduled to see Dr. Army Melia on 03/18/17 @ 10:30am. Please schedule your annual wellness exam with the Nurse Health Advisor in one year.  Preventive Care 40-64 Years, Female Preventive care refers to lifestyle choices and visits with your health care provider that can promote health and wellness. What does preventive care include?  A yearly physical exam. This is also called an annual well check.  Dental exams once or twice a year.  Routine eye exams. Ask your health care provider how often you should have your eyes checked.  Personal lifestyle choices, including:  Daily care of your teeth and gums.  Regular physical  activity.  Eating a healthy diet.  Avoiding tobacco and drug use.  Limiting alcohol use.  Practicing safe sex.  Taking low-dose aspirin daily starting at age 35.  Taking vitamin and mineral supplements as recommended by your health care provider. What happens during an annual well check? The services and screenings done by your health care provider during your annual well check will depend on your age, overall health, lifestyle risk factors, and family history of disease. Counseling  Your health care provider may ask you questions about your:  Alcohol use.  Tobacco use.  Drug use.  Emotional well-being.  Home and relationship well-being.  Sexual activity.  Eating habits.  Work and work Statistician.  Method of birth control.  Menstrual cycle.  Pregnancy history. Screening  You may have the following tests or measurements:  Height, weight, and BMI.  Blood pressure.  Lipid and cholesterol levels. These may be checked every 5 years, or more frequently if you are over 76 years old.  Skin check.  Lung cancer screening. You may have this screening every year starting at age 61 if you have a 30-pack-year history of smoking and currently smoke or have quit within the past 15 years.  Fecal occult blood test (FOBT) of the stool. You may have this test every year starting at age 72.  Flexible sigmoidoscopy or colonoscopy. You may have a sigmoidoscopy every 5 years or a colonoscopy every 10 years starting at age 67.  Hepatitis C blood test.  Hepatitis B blood test.  Sexually transmitted disease (STD) testing.  Diabetes screening. This is done by checking your blood sugar (glucose) after you have not eaten for a while (fasting). You may have this done every 1-3 years.  Mammogram. This may be done every 1-2 years. Talk to your health care provider about when you should start having regular mammograms. This may depend on whether you have a family history of breast  cancer.  BRCA-related cancer screening. This may be done if you have a family history of breast, ovarian, tubal, or peritoneal cancers.  Pelvic exam and Pap test. This may be done every 3 years starting at age 49. Starting at age 55, this may be done every 5 years if you have a Pap test in combination with an HPV test.  Bone density scan. This is done to screen for osteoporosis. You may have this scan if you are at high risk for osteoporosis. Discuss your test results, treatment options, and if necessary, the need for more tests with your health care provider. Vaccines  Your health care provider may recommend certain vaccines, such as:  Influenza vaccine. This is recommended every year.  Tetanus, diphtheria, and acellular pertussis (Tdap, Td) vaccine. You may need a Td booster every 10 years.  Zoster vaccine. You may need this after age 70.  Pneumococcal 13-valent conjugate (PCV13) vaccine. You may need this if you have certain conditions and were not previously vaccinated.  Pneumococcal polysaccharide (PPSV23) vaccine. You may need one or two doses if you smoke cigarettes or if you have certain conditions. Talk to your health care provider about which screenings and vaccines you need and how often you need them. This information is not intended to replace advice given to you by your health care provider. Make sure you discuss any questions you have with your health care provider. Document Released: 03/04/2015 Document Revised: 10/26/2015 Document Reviewed: 12/07/2014 Elsevier Interactive Patient Education  2017 [place] Prevention in the Home Falls can cause injuries. They can happen to people of all ages. There are many things you can do to make your home safe and to help prevent falls. What can I do on the outside of my home?  Regularly fix the edges of walkways and driveways and fix any cracks.  Remove anything that might make you trip as you walk through a door,  such as a raised step or threshold.  Trim any bushes or trees on the path to your home.  Use bright outdoor lighting.  Clear any walking paths of anything that might make someone trip, such as rocks or tools.  Regularly check to see if handrails are loose or broken. Make sure that both sides of any steps have handrails.  Any raised decks and porches should have guardrails on the edges.  Have any leaves, snow, or ice cleared regularly.  Use sand or salt on walking paths during winter.  Clean up any spills in your garage right away. This includes oil or grease spills. What can I do in the bathroom?  Use night lights.  Install grab bars by the toilet and in the tub and shower. Do not use towel bars as grab bars.  Use non-skid mats or decals in the tub or shower.  If you need to sit down in the shower, use a plastic, non-slip stool.  Keep the floor dry. Clean up any water that spills on the floor as soon as it happens.  Remove soap buildup in the tub or shower regularly.  Attach bath mats securely with double-sided non-slip rug tape.  Do not have throw rugs and other things on the floor that can make you trip. What can I do in the bedroom?  Use night lights.  Make sure that you have a light by your bed that is easy to reach.  Do not use any sheets or blankets that are too big for your bed. They should not hang down onto the floor.  Have a firm chair that has side arms. You can use this for support while you get dressed.  Do not have throw rugs and other things on the floor that can make you trip. What can I do in the kitchen?  Clean up any spills right away.  Avoid walking on wet floors.  Keep items that you use a lot in easy-to-reach places.  If you need to reach something above you, use a strong step stool that has a grab bar.  Keep electrical cords out of the way.  Do not use floor polish or wax that makes floors slippery. If you must use wax, use non-skid  floor wax.  Do not have throw rugs and other things on the floor that can make you trip. What can I do with my stairs?  Do not leave any items on the stairs.  Make sure that there are handrails on both sides of the stairs and use them. Fix handrails that are broken or loose. Make sure that handrails are as long as the stairways.  Check any carpeting to make sure that it is firmly attached to the stairs. Fix any carpet that is loose or worn.  Avoid having throw rugs at the top or bottom of the stairs. If you do have throw rugs, attach them to the floor with carpet tape.  Make sure that you have a light switch at the top of the stairs and the bottom of the stairs. If you do not have them, ask someone to add them for you. What else can I do to help prevent falls?  Wear shoes that:  Do not have high heels.  Have rubber bottoms.  Are comfortable and fit you well.  Are closed at the toe. Do not wear sandals.  If you use a stepladder:  Make sure that it is fully opened. Do not climb a closed stepladder.  Make sure that both sides of the stepladder are locked into place.  Ask someone to hold it for you, if possible.  Clearly mark and make sure that you can see:  Any grab bars or handrails.  First and last steps.  Where the edge of each step is.  Use tools that help you move around (mobility aids) if they are needed. These include:  Canes.  Walkers.  Scooters.  Crutches.  Turn on the lights when you go into a dark area. Replace any light bulbs as soon as they burn out.  Set up your furniture so you have a clear path. Avoid moving your furniture around.  If any of your floors are uneven, fix them.  If there are any pets around you, be aware of where they are.  Review your medicines with your doctor. Some medicines can make you feel dizzy. This can increase your chance of falling. Ask your doctor what other things that you can do to help prevent falls. This  information is not intended to replace advice given to you by your health care provider. Make sure you discuss any questions you have with your health care provider. Document Released: 12/02/2008 Document Revised: 07/14/2015 Document Reviewed: 03/12/2014 Elsevier Interactive Patient Education  2017 [employer].

## 2016-12-24 NOTE — Progress Notes (Signed)
Subjective:   Hannah [name] is a 61 y.o. female who presents for Medicare Annual (Subsequent) preventive examination.  Review of Systems:  N/A Cardiac Risk Factors include: diabetes mellitus;dyslipidemia;sedentary lifestyle;smoking/ tobacco exposure     Objective:     Vitals: Ht 5\' 5"  (1.651 m)   Wt 160 lb 3.2 oz (72.7 kg)   BMI 26.66 kg/m   Body mass index is 26.66 kg/m.   Tobacco Social History   Tobacco Use  Smoking Status Former Smoker  . Packs/day: 0.50  . Types: Cigarettes  . Last attempt to quit: 03/05/2016  . Years since quitting: 0.8  Smokeless Tobacco Never Used     Counseling given: Not Answered   Past Medical History:  Diagnosis Date  . Arthritis   . Diabetes mellitus without complication ([place])   . Diet controlled gestational diabetes mellitus in puerperium   . Emphysema of lung ([place])   . 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 GERD (gastroesophageal reflux disease)    Past Surgical History:  Procedure Laterality Date  . HIP FRACTURE SURGERY Right 03/2013  . INNER EAR SURGERY Bilateral   . REPLACEMENT TOTAL KNEE Right 02/2015  . SHOULDER SURGERY Bilateral    rotator cuff   Family History  Problem Relation Age of Onset  . Cervical cancer Mother   . Breast cancer Sister 34  . Breast cancer Cousin 62       pat cousin   Social History   Substance and Sexual Activity  Sexual Activity No    Outpatient Encounter Medications as of 12/24/2016  Medication Sig  . albuterol (PROAIR HFA) 108 (90 BASE) MCG/ACT inhaler Inhale into the lungs.  Marland Kitchen atorvastatin (LIPITOR) 10 MG tablet Take 1 tablet (10 mg total) by mouth at bedtime.  . Fluticasone-Salmeterol (ADVAIR DISKUS) 250-50 MCG/DOSE AEPB Inhale 1 puff into the lungs 2 (two) times daily.  Marland Kitchen gabapentin (NEURONTIN) 600 MG tablet Take 1 tablet (600 mg total) by mouth 3 (three) times daily.  Marland Kitchen glucose blood (BAYER CONTOUR TEST) test strip BAYER CONTOUR TEST (In Vitro Strip)  1 (one) Strip daily for 50  days  Quantity: 50;  Refills: 5   Ordered :[id number]  Halina Maidens M.D.;  Started [id number] Active Comments: dx: 250.02  . ibuprofen (ADVIL,MOTRIN) 200 MG tablet Take by mouth.  . Multiple Vitamin (MULTIVITAMIN WITH MINERALS) TABS tablet Take 1 tablet by mouth daily.  Marland Kitchen omeprazole (PRILOSEC) 20 MG capsule TAKE 1 CAPSULE BY MOUTH EVERY DAY  . oxyCODONE-acetaminophen (PERCOCET/ROXICET) 5-325 MG per tablet Take 1-2 tablets by mouth every 6 (six) hours as needed for pain.  Marland Kitchen tiZANidine (ZANAFLEX) 4 MG tablet Take 4 mg by mouth every 6 (six) hours as needed.    No facility-administered encounter medications on file as of 12/24/2016.     Activities of Daily Living In your present state of health, do you have any difficulty performing the following activities: 12/24/2016 03/05/2016  Hearing? N N  Vision? N N  Difficulty concentrating or making decisions? N N  Walking or climbing stairs? Y Y  Comment joint pain -  Dressing or bathing? N N  Doing errands, shopping? N -  Preparing Food and eating ? N -  Using the Toilet? N -  In the past six months, have you accidently leaked urine? Y -  Comment urinary frequency and stress incontinence -  Do you have problems with loss of bowel control? N -  Managing your Medications? N -  Managing your Finances? N -  Housekeeping or managing your Housekeeping? N -  Some recent data might be hidden    Patient Care Team: Glean Hess, MD as PCP - General (Family Medicine) [place], Jimmy J, MD as Consulting Physician (Physical Medicine and Rehabilitation) Melvyn Novas, MD as Referring Physician (Orthopedic Surgery)    Assessment:     Exercise Activities and Dietary recommendations Current Exercise Habits: The patient does not participate in regular exercise at present, Exercise limited by: orthopedic condition(s)(osteoarthritis)  Goals    None     Fall Risk Fall Risk  12/24/2016 11/24/2014  Falls in the past year? Yes Yes  Number falls in  past yr: 2 or more -  Injury with Fall? No -  Risk Factor Category  High Fall Risk -  Comment Spastic parapalegia -  Risk for fall due to : Impaired balance/gait;Impaired mobility History of fall(s);Impaired balance/gait;Impaired mobility  Follow up Education provided;Falls prevention discussed -   Depression Screen PHQ 2/9 Scores 12/24/2016 10/04/2016 08/25/2015 11/24/2014  PHQ - 2 Score 0 1 1 0     Cognitive Function: declined        Immunization History  Administered Date(s) Administered  . Influenza,inj,Quad PF,6+ Mos 11/24/2014, 11/25/2015  . Pneumococcal Conjugate-13 11/24/2014  . Pneumococcal Polysaccharide-23 05/20/2012   Screening Tests Health Maintenance  Topic Date Due  . OPHTHALMOLOGY EXAM  08/05/1965  . HIV Screening  08/06/1970  . MAMMOGRAM  12/12/2016  . FOOT EXAM  11/24/2016  . URINE MICROALBUMIN  11/24/2016  . PAP SMEAR  03/18/2017 (Originally 05/21/2015)  . TETANUS/TDAP  03/18/2017 (Originally 08/06/1974)  . Hepatitis C Screening  03/27/2020 (Originally August 09, 1955)  . HEMOGLOBIN A1C  04/06/2017  . PNEUMOCOCCAL POLYSACCHARIDE VACCINE (2) 05/20/2017  . COLONOSCOPY  03/05/2021  . INFLUENZA VACCINE  Completed      Plan:    I have personally reviewed and addressed the Medicare Annual Wellness questionnaire and have noted the following in the patient's chart:  A. Medical and social history B. Use of alcohol, tobacco or illicit drugs  C. Current medications and supplements D. Functional ability and status E.  Nutritional status F.  Physical activity G. Advance directives H. List of other physicians I.  Hospitalizations, surgeries, and ER visits in previous 12 months J.  [place] such as hearing and vision if needed, cognitive and depression L. Referrals and appointments - none  In addition, I have reviewed and discussed with patient certain preventive protocols, quality metrics, and best practice recommendations. A written personalized care plan  for preventive services as well as general preventive health recommendations were provided to patient.  See attached scanned questionnaire for additional information.   Signed,  Aleatha Borer, LPN Nurse Health Advisor  MD Recommendations: Due for mammogram. Ordered today. Pt provided with contact information for self scheduling.  Dexa completed with [facility]. Pt cannot remember date or results. Pt advised to provide copy of results prior to next OV.  Pt due for Diabetic Eye Exam. Advised to schedule appt with Dr. Wyatt Portela. Contact information provided.  Flu vaccine given today.

## 2016-12-25 ENCOUNTER — Other Ambulatory Visit: Payer: Self-pay | Admitting: Internal Medicine

## 2016-12-25 MED ORDER — ALBUTEROL SULFATE HFA 108 (90 BASE) MCG/ACT IN AERS: 2.0000 | INHALATION_SPRAY | Freq: Four times a day (QID) | RESPIRATORY_TRACT | 5 refills | Status: DC | PRN

## 2017-02-08 DIAGNOSIS — M5416 Radiculopathy, lumbar region: Secondary | ICD-10-CM | POA: Diagnosis not present

## 2017-02-08 DIAGNOSIS — M47816 Spondylosis without myelopathy or radiculopathy, lumbar region: Secondary | ICD-10-CM | POA: Diagnosis not present

## 2017-02-08 DIAGNOSIS — G894 Chronic pain syndrome: Secondary | ICD-10-CM | POA: Diagnosis not present

## 2017-02-08 DIAGNOSIS — G2581 Restless legs syndrome: Secondary | ICD-10-CM | POA: Diagnosis not present

## 2017-02-15 ENCOUNTER — Other Ambulatory Visit: Payer: Self-pay | Admitting: Internal Medicine

## 2017-03-07 DIAGNOSIS — G114 Hereditary spastic paraplegia: Secondary | ICD-10-CM | POA: Diagnosis not present

## 2017-03-07 DIAGNOSIS — R69 Illness, unspecified: Secondary | ICD-10-CM | POA: Diagnosis not present

## 2017-03-07 DIAGNOSIS — J449 Chronic obstructive pulmonary disease, unspecified: Secondary | ICD-10-CM | POA: Diagnosis not present

## 2017-03-07 DIAGNOSIS — G2581 Restless legs syndrome: Secondary | ICD-10-CM | POA: Diagnosis not present

## 2017-03-07 DIAGNOSIS — G8929 Other chronic pain: Secondary | ICD-10-CM | POA: Diagnosis not present

## 2017-03-07 DIAGNOSIS — K219 Gastro-esophageal reflux disease without esophagitis: Secondary | ICD-10-CM | POA: Diagnosis not present

## 2017-03-07 DIAGNOSIS — E785 Hyperlipidemia, unspecified: Secondary | ICD-10-CM | POA: Diagnosis not present

## 2017-03-07 DIAGNOSIS — K08109 Complete loss of teeth, unspecified cause, unspecified class: Secondary | ICD-10-CM | POA: Diagnosis not present

## 2017-03-07 DIAGNOSIS — E119 Type 2 diabetes mellitus without complications: Secondary | ICD-10-CM | POA: Diagnosis not present

## 2017-03-07 DIAGNOSIS — M62838 Other muscle spasm: Secondary | ICD-10-CM | POA: Diagnosis not present

## 2017-03-18 ENCOUNTER — Encounter: Payer: Self-pay | Admitting: Internal Medicine

## 2017-03-18 ENCOUNTER — Other Ambulatory Visit: Payer: Self-pay | Admitting: Internal Medicine

## 2017-03-18 ENCOUNTER — Ambulatory Visit (INDEPENDENT_AMBULATORY_CARE_PROVIDER_SITE_OTHER): Payer: Medicare HMO | Admitting: Internal Medicine

## 2017-03-18 VITALS — BP 104/64 | HR 79 | Ht 65.0 in | Wt 156.0 lb

## 2017-03-18 DIAGNOSIS — Z124 Encounter for screening for malignant neoplasm of cervix: Secondary | ICD-10-CM

## 2017-03-18 DIAGNOSIS — Z0001 Encounter for general adult medical examination with abnormal findings: Secondary | ICD-10-CM

## 2017-03-18 DIAGNOSIS — M810 Age-related osteoporosis without current pathological fracture: Secondary | ICD-10-CM | POA: Insufficient documentation

## 2017-03-18 DIAGNOSIS — E1165 Type 2 diabetes mellitus with hyperglycemia: Secondary | ICD-10-CM

## 2017-03-18 DIAGNOSIS — J439 Emphysema, unspecified: Secondary | ICD-10-CM | POA: Diagnosis not present

## 2017-03-18 DIAGNOSIS — E1169 Type 2 diabetes mellitus with other specified complication: Secondary | ICD-10-CM | POA: Diagnosis not present

## 2017-03-18 DIAGNOSIS — E118 Type 2 diabetes mellitus with unspecified complications: Secondary | ICD-10-CM

## 2017-03-18 DIAGNOSIS — G114 Hereditary spastic paraplegia: Secondary | ICD-10-CM | POA: Diagnosis not present

## 2017-03-18 DIAGNOSIS — E785 Hyperlipidemia, unspecified: Secondary | ICD-10-CM

## 2017-03-18 DIAGNOSIS — Z0000 Encounter for general adult medical examination without abnormal findings: Secondary | ICD-10-CM

## 2017-03-18 DIAGNOSIS — Z1231 Encounter for screening mammogram for malignant neoplasm of breast: Secondary | ICD-10-CM

## 2017-03-18 DIAGNOSIS — F17201 Nicotine dependence, unspecified, in remission: Secondary | ICD-10-CM

## 2017-03-18 LAB — POCT URINALYSIS DIPSTICK
Bilirubin, UA: NEGATIVE
Glucose, UA: NEGATIVE
KETONES UA: NEGATIVE
Leukocytes, UA: NEGATIVE
NITRITE UA: NEGATIVE
PH UA: 6 (ref 5.0–8.0)
PROTEIN UA: NEGATIVE
RBC UA: NEGATIVE
Spec Grav, UA: 1.01 (ref 1.010–1.025)
UROBILINOGEN UA: 0.2 U/dL

## 2017-03-18 MED ORDER — ATORVASTATIN CALCIUM 10 MG PO TABS: 10.0000 mg | ORAL_TABLET | Freq: Every day | ORAL | 3 refills | Status: DC

## 2017-03-18 NOTE — Progress Notes (Signed)
Date:  03/18/2017   Name:  Hannah [name]   DOB:  [birth year]   MRN:  [id number]   Chief Complaint: Annual Exam (Breast Exam and Papsmear. ) Hannah [name] is a 62 y.o. female who presents today for her Complete Annual Exam. She feels fairly well. She reports exercising doing house work and painting. She reports she is sleeping fairly well but may have restless leg syndrome. Pain mgmt prescribed Requip which she has not tried.  Diabetes  She presents for her follow-up diabetic visit. She has type 2 diabetes mellitus. Her disease course has been stable. Pertinent negatives for diabetes include no foot paresthesias. Symptoms are stable. Pertinent negatives for diabetic complications include no peripheral neuropathy. Current diabetic treatment includes diet. She is compliant with treatment most of the time.  Hyperlipidemia  This is a chronic problem. The problem is controlled. Current antihyperlipidemic treatment includes statins. The current treatment provides significant improvement of lipids.  COPD - has remained tobacco free and only inhaler episodically as needed.  She has both Advair and Ventolin. 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  Her left leg is predominantly involved.  She has been doing more exercises to help strengthen it and seems to be gaining so use.   RLS? - she denies typical RLS sx in the evening or when trying to go to sleep.  She mostly believes that she is very restless through the night.  She sleeps well until she has been very physically active that day.  She is hesitant to try Requip due to reports of sedation.  Review of Systems  Patient Active Problem List   Diagnosis Date Noted  . Special screening for malignant neoplasms, colon   . Benign neoplasm of descending colon   . Other osteoporosis without current pathological fracture 11/25/2015  . Hyperlipidemia associated with type 2 diabetes mellitus ([place]) 03/28/2015  . Dysphagia 11/11/2014    . 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 Diabetes mellitus type 2, uncontrolled ([place]) 11/11/2014  . Chronic obstructive pulmonary emphysema ([place]) 11/11/2014  . Tobacco use disorder, mild, 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  . Degenerative joint disease involving multiple joints 09/28/2013    Prior to Admission medications   Medication Sig Start Date End Date Taking? Authorizing Provider  albuterol (PROVENTIL HFA;VENTOLIN HFA) 108 (90 Base) MCG/ACT inhaler Inhale 2 puffs every 6 (six) hours as needed into the lungs for wheezing or shortness of breath. 12/25/16  Yes Glean Hess, MD  atorvastatin (LIPITOR) 10 MG tablet Take 1 tablet (10 mg total) by mouth at bedtime. 10/04/16  Yes Glean Hess, MD  Fluticasone-Salmeterol (ADVAIR DISKUS) 250-50 MCG/DOSE AEPB Inhale 1 puff 2 (two) times daily into the lungs. 12/24/16  Yes Glean Hess, MD  gabapentin (NEURONTIN) 600 MG tablet Take 1 tablet (600 mg total) by mouth 3 (three) times daily. 08/25/15  Yes Glean Hess, MD  glucose blood (BAYER CONTOUR TEST) test strip BAYER CONTOUR TEST (In Vitro Strip)  1 (one) Strip daily for 50 days  Quantity: 50;  Refills: 5   Ordered :[id number]  Halina Maidens M.D.;  Started 22-Jun-2013 Active Comments: dx: 250.02 06/22/13  Yes [provider]  ibuprofen (ADVIL,MOTRIN) 200 MG tablet Take by mouth.   Yes [provider]  Multiple Vitamin (MULTIVITAMIN WITH MINERALS) TABS tablet Take 1 tablet by mouth daily.   Yes [provider]  omeprazole (PRILOSEC) 20 MG capsule TAKE 1 CAPSULE BY MOUTH EVERY DAY 02/15/17  Yes Glean Hess, MD  oxyCODONE-acetaminophen (PERCOCET/ROXICET) 5-325 MG per tablet Take 1-2 tablets by mouth every 6 (six) hours as needed for pain. 11/23/12  Yes Carlisle Cater, PA-C  tiZANidine (ZANAFLEX) 4 MG tablet Take 4 mg by mouth every 6 (six) hours as needed.  10/01/12  Yes [provider]    Allergies  Allergen  Reactions  . Levofloxacin Nausea And Vomiting    Past Surgical History:  Procedure Laterality Date  . COLONOSCOPY WITH PROPOFOL N/A 03/05/2016   Procedure: COLONOSCOPY WITH PROPOFOL;  Surgeon: [name], MD;  Location: [facility];  Service: Endoscopy;  Laterality: N/A;  . HIP FRACTURE SURGERY Right 03/2013  . INNER EAR SURGERY Bilateral   . REPLACEMENT TOTAL KNEE Right 02/2015  . SHOULDER SURGERY Bilateral    rotator cuff    Social History   Tobacco Use  . Smoking status: Former Smoker    Packs/day: 0.50    Types: Cigarettes    Last attempt to quit: 03/05/2016    Years since quitting: 1.0  . Smokeless tobacco: Never Used  Substance Use Topics  . Alcohol use: No  . Drug use: No     Medication list has been reviewed and updated.  PHQ 2/9 Scores 12/24/2016 10/04/2016 08/25/2015 11/24/2014  PHQ - 2 Score 0 1 1 0    Physical Exam  Constitutional: She is oriented to person, place, and time. She appears well-developed and well-nourished. No distress.  HENT:  Head: Normocephalic and atraumatic.  Right Ear: Tympanic membrane and ear canal normal.  Left Ear: Tympanic membrane and ear canal normal.  Nose: Right sinus exhibits no maxillary sinus tenderness. Left sinus exhibits no maxillary sinus tenderness.  Mouth/Throat: Uvula is midline and oropharynx is clear and moist.  Eyes: Conjunctivae and EOM are normal. Right eye exhibits no discharge. Left eye exhibits no discharge. No scleral icterus.  Neck: Normal range of motion. Carotid bruit is not present. No erythema present. No thyromegaly present.  Cardiovascular: Normal rate, regular rhythm, normal heart sounds and normal pulses.  Pulmonary/Chest: Effort normal. No respiratory distress. She has no wheezes. Right breast exhibits no mass, no nipple discharge, no skin change and no tenderness. Left breast exhibits no mass, no nipple discharge, no skin change and no tenderness.  Abdominal: Soft. Bowel sounds are normal. There is  no hepatosplenomegaly. There is no tenderness. There is no CVA tenderness.  Genitourinary: Vagina normal. There is no tenderness, lesion or injury on the right labia. There is no tenderness, lesion or injury on the left labia. Uterus is deviated (to the left, no enlarged or tender). Cervix exhibits no motion tenderness, no discharge and no friability. Right adnexum displays no mass, no tenderness and no fullness. Left adnexum displays no mass, no tenderness and no fullness.  Musculoskeletal: Normal range of motion.  Lymphadenopathy:    She has no cervical adenopathy.    She has no axillary adenopathy.  Neurological: She is alert and oriented to person, place, and time. She has normal reflexes. She displays atrophy. No cranial nerve deficit or sensory deficit. She exhibits abnormal muscle tone. She displays no seizure activity. Gait abnormal.  Skin: Skin is warm, dry and intact. No rash noted.  Thick calluses on both feet - no ulcerations Toesnails moderately long - none ingrown or thickened  Psychiatric: She has a normal mood and affect. Her speech is normal and behavior is normal. Thought content normal.  Nursing note and vitals reviewed.   BP 104/64   Pulse 79  Ht 5\' 5"  (1.651 m)   Wt 156 lb (70.8 kg)   SpO2 97%   BMI 25.96 kg/m   Assessment and Plan: 1. Annual physical exam Continue activity as toleated - POCT urinalysis dipstick  2. Papanicolaou smear for cervical cancer screening Performed today - Pap IG and HPV (high risk) DNA detection  3. 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 Hyperlipidemia associated with type 2 diabetes mellitus ([place]) On statin - atorvastatin (LIPITOR) 10 MG tablet; Take 1 tablet (10 mg total) by mouth at bedtime.  Dispense: 90 tablet; Refill: 3 - Comprehensive metabolic panel - Lipid panel  5. Pulmonary emphysema, unspecified emphysema type (HCC) Stable, use Advair and  albuterol as needed - CBC with Differential/Platelet  6. Uncontrolled type 2 diabetes mellitus with hyperglycemia ([place]) Will add medication if needed - Microalbumin / creatinine urine ratio - Hemoglobin A1c - TSH - POCT urinalysis dipstick  8. Encounter for screening mammogram for breast cancer - MM DIGITAL SCREENING BILATERAL; Future  9. Tobacco use disorder in sustained remission Pt congratulated on remaining tobacco free  Meds ordered this encounter  Medications  . atorvastatin (LIPITOR) 10 MG tablet    Sig: Take 1 tablet (10 mg total) by mouth at bedtime.    Dispense:  90 tablet    Refill:  3    Partially dictated using Editor, commissioning. Any errors are unintentional.  Halina Maidens, MD [facility]  03/18/2017

## 2017-03-18 NOTE — Patient Instructions (Addendum)
Complete vision exams are commended annually for all persons with diabetes.  Please make your eye doctor aware of your diabetes and request that your primary care provider be sent a copy of the office notes and findings after each visit.  Breast Self-Awareness Breast self-awareness means being familiar with how your breasts look and feel. It involves checking your breasts regularly and reporting any changes to your health care provider. Practicing breast self-awareness is important. A change in your breasts can be a sign of a serious medical problem. Being familiar with how your breasts look and feel allows you to find any problems early, when treatment is more likely to be successful. All women should practice breast self-awareness, including women who have had breast implants. How to do a breast self-exam One way to learn what is normal for your breasts and whether your breasts are changing is to do a breast self-exam. To do a breast self-exam: Look for Changes  1. Remove all the clothing above your waist. 2. Stand in front of a mirror in a room with good lighting. 3. Put your hands on your hips. 4. Push your hands firmly downward. 5. Compare your breasts in the mirror. Look for differences between them (asymmetry), such as: ? Differences in shape. ? Differences in size. ? Puckers, dips, and bumps in one breast and not the other. 6. Look at each breast for changes in your skin, such as: ? Redness. ? Scaly areas. 7. Look for changes in your nipples, such as: ? Discharge. ? Bleeding. ? Dimpling. ? Redness. ? A change in position. Feel for Changes  Carefully feel your breasts for lumps and changes. It is best to do this while lying on your back on the floor and again while sitting or standing in the shower or tub with soapy water on your skin. Feel each breast in the following way:  Place the arm on the side of the breast you are examining above your head.  Feel your breast with the  other hand.  Start in the nipple area and make  inch (2 cm) overlapping circles to feel your breast. Use the pads of your three middle fingers to do this. Apply light pressure, then medium pressure, then firm pressure. The light pressure will allow you to feel the tissue closest to the skin. The medium pressure will allow you to feel the tissue that is a little deeper. The firm pressure will allow you to feel the tissue close to the ribs.  Continue the overlapping circles, moving downward over the breast until you feel your ribs below your breast.  Move one finger-width toward the center of the body. Continue to use the  inch (2 cm) overlapping circles to feel your breast as you move slowly up toward your collarbone.  Continue the up and down exam using all three pressures until you reach your armpit.  Write Down What You Find  Write down what is normal for each breast and any changes that you find. Keep a written record with breast changes or normal findings for each breast. By writing this information down, you do not need to depend only on memory for size, tenderness, or location. Write down where you are in your menstrual cycle, if you are still menstruating. If you are having trouble noticing differences in your breasts, do not get discouraged. With time you will become more familiar with the variations in your breasts and more comfortable with the exam. How often should I examine my  breasts? Examine your breasts every month. If you are breastfeeding, the best time to examine your breasts is after a feeding or after using a breast pump. If you menstruate, the best time to examine your breasts is 5-7 days after your period is over. During your period, your breasts are lumpier, and it may be more difficult to notice changes. When should I see my health care provider? See your health care provider if you notice:  A change in shape or size of your breasts or nipples.  A change in the skin of  your breast or nipples, such as a reddened or scaly area.  Unusual discharge from your nipples.  A lump or thick area that was not there before.  Pain in your breasts.  Anything that concerns you.  This information is not intended to replace advice given to you by your health care provider. Make sure you discuss any questions you have with your health care provider. Document Released: 02/05/2005 Document Revised: 07/14/2015 Document Reviewed: 12/26/2014 Elsevier Interactive Patient Education  [employer].

## 2017-03-19 ENCOUNTER — Other Ambulatory Visit: Payer: Self-pay | Admitting: Internal Medicine

## 2017-03-19 LAB — CBC WITH DIFFERENTIAL/PLATELET
BASOS: 0 %
Basophils Absolute: 0 10*3/uL (ref 0.0–0.2)
EOS (ABSOLUTE): 0.2 10*3/uL (ref 0.0–0.4)
Eos: 1 %
Hematocrit: 45.3 % (ref 34.0–46.6)
Hemoglobin: 15.2 g/dL (ref 11.1–15.9)
IMMATURE GRANS (ABS): 0 10*3/uL (ref 0.0–0.1)
Immature Granulocytes: 0 %
LYMPHS ABS: 4.8 10*3/uL — AB (ref 0.7–3.1)
Lymphs: 39 %
MCH: 31.6 pg (ref 26.6–33.0)
MCHC: 33.6 g/dL (ref 31.5–35.7)
MCV: 94 fL (ref 79–97)
MONOS ABS: 0.7 10*3/uL (ref 0.1–0.9)
Monocytes: 6 %
NEUTROS ABS: 6.4 10*3/uL (ref 1.4–7.0)
Neutrophils: 54 %
PLATELETS: 288 10*3/uL (ref 150–379)
RBC: 4.81 x10E6/uL (ref 3.77–5.28)
RDW: 13.6 % (ref 12.3–15.4)
WBC: 12.1 10*3/uL — ABNORMAL HIGH (ref 3.4–10.8)

## 2017-03-19 LAB — COMPREHENSIVE METABOLIC PANEL
A/G RATIO: 1.6 (ref 1.2–2.2)
ALK PHOS: 83 IU/L (ref 39–117)
ALT: 15 IU/L (ref 0–32)
AST: 19 IU/L (ref 0–40)
Albumin: 4.6 g/dL (ref 3.6–4.8)
BILIRUBIN TOTAL: 0.4 mg/dL (ref 0.0–1.2)
BUN/Creatinine Ratio: 10 — ABNORMAL LOW (ref 12–28)
BUN: 6 mg/dL — ABNORMAL LOW (ref 8–27)
CHLORIDE: 98 mmol/L (ref 96–106)
CO2: 23 mmol/L (ref 20–29)
Calcium: 9.9 mg/dL (ref 8.7–10.3)
Creatinine, Ser: 0.59 mg/dL (ref 0.57–1.00)
GFR calc non Af Amer: 99 mL/min/{1.73_m2} (ref >59)
GFR, EST AFRICAN AMERICAN: 114 mL/min/{1.73_m2} (ref >59)
Globulin, Total: 2.8 g/dL (ref 1.5–4.5)
Glucose: 147 mg/dL — ABNORMAL HIGH (ref 65–99)
POTASSIUM: 4.4 mmol/L (ref 3.5–5.2)
Sodium: 139 mmol/L (ref 134–144)
Total Protein: 7.4 g/dL (ref 6.0–8.5)

## 2017-03-19 LAB — LIPID PANEL
CHOLESTEROL TOTAL: 227 mg/dL — AB (ref 100–199)
Chol/HDL Ratio: 3.8 ratio (ref 0.0–4.4)
HDL: 59 mg/dL (ref >39)
LDL Calculated: 144 mg/dL — ABNORMAL HIGH (ref 0–99)
Triglycerides: 118 mg/dL (ref 0–149)
VLDL Cholesterol Cal: 24 mg/dL (ref 5–40)

## 2017-03-19 LAB — HEMOGLOBIN A1C
ESTIMATED AVERAGE GLUCOSE: 186 mg/dL
HEMOGLOBIN A1C: 8.1 % — AB (ref 4.8–5.6)

## 2017-03-19 LAB — TSH: TSH: 1.11 u[IU]/mL (ref 0.450–4.500)

## 2017-03-19 MED ORDER — METFORMIN HCL ER 500 MG PO TB24: 500.0000 mg | ORAL_TABLET | Freq: Every day | ORAL | 4 refills | Status: DC

## 2017-03-20 LAB — PAP IG AND HPV HIGH-RISK
HPV, high-risk: NEGATIVE
PAP Smear Comment: 0

## 2017-03-21 LAB — MICROALBUMIN / CREATININE URINE RATIO: Creatinine, Urine: 29.5 mg/dL

## 2017-04-08 ENCOUNTER — Telehealth: Payer: Self-pay | Admitting: Internal Medicine

## 2017-04-08 NOTE — Telephone Encounter (Signed)
Called to schedule AWV with Nurse Health Advisor. Last AWV on 12/24/16 please schedule AWV with NHA any date after 12/24/17.   Jill Alexanders [phone]  Skype [email or web address]

## 2017-04-09 ENCOUNTER — Telehealth: Payer: Self-pay

## 2017-04-09 NOTE — Telephone Encounter (Signed)
Patient called and left VM yesterday afternoon stated she missed call about scheduling appt. Looked in chart and seen Jill Alexanders called to schedule patient's next MAW. Please review the date needed and then call patient to have this scheduled.

## 2017-04-17 ENCOUNTER — Ambulatory Visit
Admission: RE | Admit: 2017-04-17 | Discharge: 2017-04-17 | Disposition: A | Discharge status: Home / Self Care | Payer: Medicare HMO | Source: Ambulatory Visit | Attending: Internal Medicine | Admitting: Internal Medicine

## 2017-04-17 ENCOUNTER — Encounter (INDEPENDENT_AMBULATORY_CARE_PROVIDER_SITE_OTHER): Payer: Self-pay

## 2017-04-17 DIAGNOSIS — Z1231 Encounter for screening mammogram for malignant neoplasm of breast: Secondary | ICD-10-CM | POA: Diagnosis not present

## 2017-05-09 DIAGNOSIS — G2581 Restless legs syndrome: Secondary | ICD-10-CM | POA: Diagnosis not present

## 2017-05-09 DIAGNOSIS — M47816 Spondylosis without myelopathy or radiculopathy, lumbar region: Secondary | ICD-10-CM | POA: Diagnosis not present

## 2017-05-09 DIAGNOSIS — M5416 Radiculopathy, lumbar region: Secondary | ICD-10-CM | POA: Diagnosis not present

## 2017-05-09 DIAGNOSIS — G894 Chronic pain syndrome: Secondary | ICD-10-CM | POA: Diagnosis not present

## 2017-05-09 DIAGNOSIS — Z79899 Other long term (current) drug therapy: Secondary | ICD-10-CM | POA: Diagnosis not present

## 2017-07-18 ENCOUNTER — Ambulatory Visit (INDEPENDENT_AMBULATORY_CARE_PROVIDER_SITE_OTHER): Payer: Medicare HMO | Admitting: Internal Medicine

## 2017-07-18 ENCOUNTER — Encounter: Payer: Self-pay | Admitting: Internal Medicine

## 2017-07-18 VITALS — BP 140/80 | HR 78 | Temp 98.1°F | Resp 16 | Ht 63.0 in | Wt 159.0 lb

## 2017-07-18 DIAGNOSIS — E118 Type 2 diabetes mellitus with unspecified complications: Secondary | ICD-10-CM

## 2017-07-18 DIAGNOSIS — E785 Hyperlipidemia, unspecified: Secondary | ICD-10-CM | POA: Diagnosis not present

## 2017-07-18 DIAGNOSIS — T300 Burn of unspecified body region, unspecified degree: Secondary | ICD-10-CM

## 2017-07-18 DIAGNOSIS — L02619 Cutaneous abscess of unspecified foot: Secondary | ICD-10-CM | POA: Diagnosis not present

## 2017-07-18 DIAGNOSIS — L03119 Cellulitis of unspecified part of limb: Secondary | ICD-10-CM | POA: Diagnosis not present

## 2017-07-18 DIAGNOSIS — E1169 Type 2 diabetes mellitus with other specified complication: Secondary | ICD-10-CM

## 2017-07-18 MED ORDER — AMOXICILLIN-POT CLAVULANATE 875-125 MG PO TABS: 1.0000 | ORAL_TABLET | Freq: Two times a day (BID) | ORAL | 0 refills | Status: AC

## 2017-07-18 MED ORDER — METFORMIN HCL ER 500 MG PO TB24: 1000.0000 mg | ORAL_TABLET | Freq: Every day | ORAL | 5 refills | Status: DC
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
## 2017-07-19 LAB — HEMOGLOBIN A1C
ESTIMATED AVERAGE GLUCOSE: 206 mg/dL
HEMOGLOBIN A1C: 8.8 % — AB (ref 4.8–5.6)

## 2017-07-19 LAB — BASIC METABOLIC PANEL
BUN/Creatinine Ratio: 11 — ABNORMAL LOW (ref 12–28)
BUN: 7 mg/dL — ABNORMAL LOW (ref 8–27)
CALCIUM: 9.5 mg/dL (ref 8.7–10.3)
CHLORIDE: 100 mmol/L (ref 96–106)
CO2: 22 mmol/L (ref 20–29)
Creatinine, Ser: 0.61 mg/dL (ref 0.57–1.00)
GFR calc non Af Amer: 98 mL/min/{1.73_m2} (ref >59)
GFR, EST AFRICAN AMERICAN: 113 mL/min/{1.73_m2} (ref >59)
Glucose: 169 mg/dL — ABNORMAL HIGH (ref 65–99)
POTASSIUM: 4.7 mmol/L (ref 3.5–5.2)
SODIUM: 140 mmol/L (ref 134–144)

## 2017-07-19 LAB — CBC WITH DIFFERENTIAL/PLATELET
BASOS ABS: 0 10*3/uL (ref 0.0–0.2)
BASOS: 0 %
EOS (ABSOLUTE): 0.1 10*3/uL (ref 0.0–0.4)
Eos: 1 %
Hematocrit: 46 % (ref 34.0–46.6)
Hemoglobin: 14.9 g/dL (ref 11.1–15.9)
Immature Grans (Abs): 0 10*3/uL (ref 0.0–0.1)
Immature Granulocytes: 0 %
LYMPHS ABS: 3 10*3/uL (ref 0.7–3.1)
Lymphs: 31 %
MCH: 31.3 pg (ref 26.6–33.0)
MCHC: 32.4 g/dL (ref 31.5–35.7)
MCV: 97 fL (ref 79–97)
MONOS ABS: 0.7 10*3/uL (ref 0.1–0.9)
Monocytes: 7 %
NEUTROS ABS: 5.9 10*3/uL (ref 1.4–7.0)
Neutrophils: 61 %
PLATELETS: 242 10*3/uL (ref 150–450)
RBC: 4.76 x10E6/uL (ref 3.77–5.28)
RDW: 13.2 % (ref 12.3–15.4)
WBC: 9.7 10*3/uL (ref 3.4–10.8)

## 2017-07-19 LAB — LIPID PANEL
CHOLESTEROL TOTAL: 194 mg/dL (ref 100–199)
Chol/HDL Ratio: 4.1 ratio (ref 0.0–4.4)
HDL: 47 mg/dL (ref >39)
LDL Calculated: 107 mg/dL — ABNORMAL HIGH (ref 0–99)
Triglycerides: 199 mg/dL — ABNORMAL HIGH (ref 0–149)
VLDL Cholesterol Cal: 40 mg/dL (ref 5–40)

## 2017-08-02 DIAGNOSIS — M47816 Spondylosis without myelopathy or radiculopathy, lumbar region: Secondary | ICD-10-CM | POA: Diagnosis not present

## 2017-08-02 DIAGNOSIS — M19049 Primary osteoarthritis, unspecified hand: Secondary | ICD-10-CM | POA: Diagnosis not present

## 2017-08-02 DIAGNOSIS — G2581 Restless legs syndrome: Secondary | ICD-10-CM | POA: Diagnosis not present

## 2017-08-02 DIAGNOSIS — Z79899 Other long term (current) drug therapy: Secondary | ICD-10-CM | POA: Diagnosis not present

## 2017-08-02 DIAGNOSIS — G894 Chronic pain syndrome: Secondary | ICD-10-CM | POA: Diagnosis not present

## 2017-08-02 DIAGNOSIS — M5416 Radiculopathy, lumbar region: Secondary | ICD-10-CM | POA: Diagnosis not present
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
## 2017-11-01 DIAGNOSIS — G2581 Restless legs syndrome: Secondary | ICD-10-CM | POA: Diagnosis not present

## 2017-11-01 DIAGNOSIS — Z79899 Other long term (current) drug therapy: Secondary | ICD-10-CM | POA: Diagnosis not present

## 2017-11-01 DIAGNOSIS — G894 Chronic pain syndrome: Secondary | ICD-10-CM | POA: Diagnosis not present

## 2017-11-01 DIAGNOSIS — M47816 Spondylosis without myelopathy or radiculopathy, lumbar region: Secondary | ICD-10-CM | POA: Diagnosis not present

## 2017-11-01 DIAGNOSIS — M6530 Trigger finger, unspecified finger: Secondary | ICD-10-CM | POA: Diagnosis not present

## 2017-11-01 DIAGNOSIS — M5416 Radiculopathy, lumbar region: Secondary | ICD-10-CM | POA: Diagnosis not present

## 2017-11-01 DIAGNOSIS — M19049 Primary osteoarthritis, unspecified hand: Secondary | ICD-10-CM | POA: Diagnosis not present

## 2017-11-06 ENCOUNTER — Telehealth: Payer: Self-pay | Admitting: Internal Medicine

## 2017-11-06 NOTE — Telephone Encounter (Signed)
Called to RE schedule Medicare Annual Wellness Visit with the Nurse Health Advisor.    Thank you! For any questions please contact: Jill Alexanders [phone] or Skype at: [email or web address]

## 2017-11-15 DIAGNOSIS — M65311 Trigger thumb, right thumb: Secondary | ICD-10-CM | POA: Diagnosis not present

## 2017-11-19 ENCOUNTER — Ambulatory Visit (INDEPENDENT_AMBULATORY_CARE_PROVIDER_SITE_OTHER): Payer: Medicare HMO | Admitting: Internal Medicine

## 2017-11-19 ENCOUNTER — Encounter: Payer: Self-pay | Admitting: Internal Medicine

## 2017-11-19 VITALS — BP 112/70 | HR 112 | Temp 97.8°F | Ht 65.0 in | Wt 153.0 lb

## 2017-11-19 DIAGNOSIS — E1169 Type 2 diabetes mellitus with other specified complication: Secondary | ICD-10-CM

## 2017-11-19 DIAGNOSIS — J439 Emphysema, unspecified: Secondary | ICD-10-CM

## 2017-11-19 DIAGNOSIS — G114 Hereditary spastic paraplegia: Secondary | ICD-10-CM | POA: Diagnosis not present

## 2017-11-19 DIAGNOSIS — E785 Hyperlipidemia, unspecified: Secondary | ICD-10-CM

## 2017-11-19 DIAGNOSIS — E118 Type 2 diabetes mellitus with unspecified complications: Secondary | ICD-10-CM | POA: Diagnosis not present

## 2017-11-19 MED ORDER — FLUTICASONE-SALMETEROL 250-50 MCG/DOSE IN AEPB: 1.0000 | INHALATION_SPRAY | Freq: Two times a day (BID) | RESPIRATORY_TRACT | 5 refills | Status: DC

## 2017-11-19 MED ORDER — AZITHROMYCIN 250 MG PO TABS: ORAL_TABLET | ORAL | 0 refills | Status: AC

## 2017-11-19 MED ORDER — METFORMIN HCL ER 500 MG PO TB24: 1000.0000 mg | ORAL_TABLET | Freq: Every day | ORAL | 5 refills | Status: DC

## 2017-11-19 MED ORDER — ALBUTEROL SULFATE HFA 108 (90 BASE) MCG/ACT IN AERS: 2.0000 | INHALATION_SPRAY | Freq: Four times a day (QID) | RESPIRATORY_TRACT | 5 refills | Status: DC | PRN

## 2017-11-19 NOTE — Progress Notes (Signed)
Date:  11/19/2017   Name:  Hannah [name]   DOB:  09-17-1955   MRN:  [id number]   Chief Complaint: Diabetes; Immunizations (Flu shot); and Cough (Cough- yellow production. No fever. Congestion in chest.)  Diabetes  She presents for her follow-up diabetic visit. She has type 2 diabetes mellitus. Pertinent negatives for hypoglycemia include no dizziness or headaches. Pertinent negatives for diabetes include no chest pain and no fatigue. She is compliant with treatment all of the time (but did not get message to increase metformin to 2 per day). Her weight is stable. She monitors blood glucose at home 3-4 x per week. Her breakfast blood glucose is taken between 6-7 am. Her breakfast blood glucose range is generally 140-180 mg/dl. An ACE inhibitor/angiotensin II receptor blocker is not being taken.  Cough  This is a recurrent problem. The problem has been gradually worsening. The problem occurs every few minutes. The cough is productive of sputum. Associated symptoms include myalgias, shortness of breath and wheezing. Pertinent negatives include no chest pain, chills, fever, headaches, rash or sore throat. The symptoms are aggravated by exercise and lying down. She has tried a beta-agonist inhaler and steroid inhaler for the symptoms. The treatment provided moderate relief.  Spastic paraparesis - stable, using walker to ambulate.  Taking muscle relaxants regularly.  Lab Results  Component Value Date   HGBA1C 8.8 (H) 07/18/2017     Review of Systems  Constitutional: Negative for chills, fatigue and fever.  HENT: Negative for congestion and sore throat.   Respiratory: Positive for cough, shortness of breath and wheezing.   Cardiovascular: Negative for chest pain, palpitations and leg swelling.  Gastrointestinal: Negative for abdominal pain.  Musculoskeletal: Positive for arthralgias, back pain, gait problem and myalgias.  Skin: Negative for rash.  Neurological: Negative for dizziness and  headaches.  Psychiatric/Behavioral: Negative for dysphoric mood and sleep disturbance.    Patient Active Problem List   Diagnosis Date Noted  . 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 Special screening for malignant neoplasms, colon   . Benign neoplasm of descending colon   . Other osteoporosis without current pathological fracture 11/25/2015  . Hyperlipidemia associated with type 2 diabetes mellitus ([place]) 03/28/2015  . Dysphagia 11/11/2014  . 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 DM type 2, controlled, with complication ([place]) 38/11/1749  . Chronic obstructive pulmonary emphysema ([place]) 11/11/2014  . Tobacco use disorder, mild, 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  . Degenerative joint disease involving multiple joints 09/28/2013    Allergies  Allergen Reactions  . Levofloxacin Nausea And Vomiting    Past Surgical History:  Procedure Laterality Date  . COLONOSCOPY WITH PROPOFOL N/A 03/05/2016   Procedure: COLONOSCOPY WITH PROPOFOL;  Surgeon: [name], MD;  Location: [facility];  Service: Endoscopy;  Laterality: N/A;  . HIP FRACTURE SURGERY Right 03/2013  . INNER EAR SURGERY Bilateral   . REPLACEMENT TOTAL KNEE Right 02/2015  . SHOULDER SURGERY Bilateral    rotator cuff    Social History   Tobacco Use  . Smoking status: Former Smoker    Packs/day: 0.50    Types: Cigarettes    Last attempt to quit: 03/05/2016    Years since quitting: 1.7  . Smokeless tobacco: Never Used  Substance Use Topics  . Alcohol use: No  . Drug use: No     Medication list has been reviewed and updated.  Current Meds  Medication Sig  . albuterol (PROVENTIL HFA;VENTOLIN HFA) 108 (90 Base) MCG/ACT inhaler Inhale  2 puffs every 6 (six) hours as needed into the lungs for wheezing or shortness of breath.  Marland Kitchen atorvastatin (LIPITOR) 10 MG tablet Take 1 tablet (10 mg total) by mouth at bedtime.  . Fluticasone-Salmeterol (ADVAIR DISKUS) 250-50 MCG/DOSE AEPB Inhale  1 puff 2 (two) times daily into the lungs.  . gabapentin (NEURONTIN) 600 MG tablet Take 1 tablet (600 mg total) by mouth 3 (three) times daily. (Patient taking differently: Take 600 mg by mouth 2 (two) times daily. )  . glucose blood (BAYER CONTOUR TEST) test strip BAYER CONTOUR TEST (In Vitro Strip)  1 (one) Strip daily for 50 days  Quantity: 50;  Refills: 5   Ordered :[id number]  Halina Maidens M.D.;  Started 22-Jun-2013 Active Comments: dx: 250.02  . metFORMIN (GLUCOPHAGE-XR) 500 MG 24 hr tablet Take 2 tablets (1,000 mg total) by mouth daily with breakfast.  . Multiple Vitamin (MULTIVITAMIN WITH MINERALS) TABS tablet Take 1 tablet by mouth daily.  Marland Kitchen omeprazole (PRILOSEC) 20 MG capsule TAKE 1 CAPSULE BY MOUTH EVERY DAY  . oxyCODONE-acetaminophen (PERCOCET/ROXICET) 5-325 MG per tablet Take 1-2 tablets by mouth every 6 (six) hours as needed for pain.  Marland Kitchen tiZANidine (ZANAFLEX) 4 MG tablet Take 4 mg by mouth every 6 (six) hours as needed.     PHQ 2/9 Scores 12/24/2016 10/04/2016 08/25/2015 11/24/2014  PHQ - 2 Score 0 1 1 0    Physical Exam  Constitutional: She is oriented to person, place, and time. She appears well-developed. No distress.  HENT:  Head: Normocephalic and atraumatic.  Neck: Normal range of motion. Neck supple.  Cardiovascular: Normal rate, regular rhythm and normal heart sounds.  Pulmonary/Chest: Effort normal. No respiratory distress. She has decreased breath sounds. She has wheezes.  Musculoskeletal: She exhibits no edema.  Neurological: She is alert and oriented to person, place, and time. She exhibits abnormal muscle tone. Gait abnormal.  Skin: Skin is warm and dry. No rash noted.  Psychiatric: She has a normal mood and affect. Her behavior is normal. Thought content normal.  Nursing note and vitals reviewed.   BP 112/70 (BP Location: Right Arm, Patient Position: Sitting, Cuff Size: Normal)   Pulse (!) 112   Temp 97.8 F (36.6 C) (Oral)   Ht 5\' 5"  (1.651 m)   Wt 153  lb (69.4 kg)   SpO2 95%   BMI 25.46 kg/m   Assessment and Plan: 1. DM type 2, controlled, with complication (HCC) Increase metformin to 2 per day with first meal  - metFORMIN (GLUCOPHAGE-XR) 500 MG 24 hr tablet; Take 2 tablets (1,000 mg total) by mouth daily with breakfast.  Dispense: 60 tablet; Refill: 5 - Hemoglobin Q3E - Basic metabolic panel  2. Pulmonary emphysema, unspecified emphysema type ([place]) Get influenza vaccine after antibiotics are complete - Fluticasone-Salmeterol (ADVAIR DISKUS) 250-50 MCG/DOSE AEPB; Inhale 1 puff into the lungs 2 (two) times daily.  Dispense: 60 each; Refill: 5 - albuterol (PROVENTIL HFA;VENTOLIN HFA) 108 (90 Base) MCG/ACT inhaler; Inhale 2 puffs into the lungs every 6 (six) hours as needed for wheezing or shortness of breath.  Dispense: 1 Inhaler; Refill: 5 - azithromycin (ZITHROMAX Z-PAK) 250 MG tablet; UAD  Dispense: 6 each; Refill: 0 - CBC with Differential/Platelet  3. Hyperlipidemia associated with type 2 diabetes mellitus ([place]) On statin therapy  4. 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 Any errors are unintentional.  Halina Maidens, MD [facility]  11/19/2017

## 2017-11-20 DIAGNOSIS — M545 Low back pain: Secondary | ICD-10-CM | POA: Diagnosis not present

## 2017-11-20 LAB — CBC WITH DIFFERENTIAL/PLATELET
BASOS ABS: 0.1 10*3/uL (ref 0.0–0.2)
Basos: 1 %
EOS (ABSOLUTE): 0.2 10*3/uL (ref 0.0–0.4)
Eos: 1 %
Hematocrit: 42.8 % (ref 34.0–46.6)
Hemoglobin: 14.2 g/dL (ref 11.1–15.9)
IMMATURE GRANS (ABS): 0.1 10*3/uL (ref 0.0–0.1)
IMMATURE GRANULOCYTES: 1 %
LYMPHS: 23 %
Lymphocytes Absolute: 3.6 10*3/uL — ABNORMAL HIGH (ref 0.7–3.1)
MCH: 31.3 pg (ref 26.6–33.0)
MCHC: 33.2 g/dL (ref 31.5–35.7)
MCV: 94 fL (ref 79–97)
MONOCYTES: 6 %
Monocytes Absolute: 1 10*3/uL — ABNORMAL HIGH (ref 0.1–0.9)
Neutrophils Absolute: 10.5 10*3/uL — ABNORMAL HIGH (ref 1.4–7.0)
Neutrophils: 68 %
Platelets: 302 10*3/uL (ref 150–450)
RBC: 4.54 x10E6/uL (ref 3.77–5.28)
RDW: 12.3 % (ref 12.3–15.4)
WBC: 15.4 10*3/uL — ABNORMAL HIGH (ref 3.4–10.8)

## 2017-11-20 LAB — BASIC METABOLIC PANEL
BUN/Creatinine Ratio: 13 (ref 12–28)
BUN: 7 mg/dL — AB (ref 8–27)
CALCIUM: 9.4 mg/dL (ref 8.7–10.3)
CO2: 20 mmol/L (ref 20–29)
Chloride: 96 mmol/L (ref 96–106)
Creatinine, Ser: 0.56 mg/dL — ABNORMAL LOW (ref 0.57–1.00)
GFR calc non Af Amer: 100 mL/min/{1.73_m2} (ref >59)
GFR, EST AFRICAN AMERICAN: 116 mL/min/{1.73_m2} (ref >59)
Glucose: 198 mg/dL — ABNORMAL HIGH (ref 65–99)
Potassium: 4.3 mmol/L (ref 3.5–5.2)
Sodium: 135 mmol/L (ref 134–144)

## 2017-11-20 LAB — HEMOGLOBIN A1C
ESTIMATED AVERAGE GLUCOSE: 192 mg/dL
Hgb A1c MFr Bld: 8.3 % — ABNORMAL HIGH (ref 4.8–5.6)

## 2017-11-22 ENCOUNTER — Other Ambulatory Visit: Payer: Self-pay | Admitting: Internal Medicine

## 2017-11-22 DIAGNOSIS — E1169 Type 2 diabetes mellitus with other specified complication: Secondary | ICD-10-CM

## 2017-11-22 DIAGNOSIS — E785 Hyperlipidemia, unspecified: Principal | ICD-10-CM

## 2017-11-23 DIAGNOSIS — M47816 Spondylosis without myelopathy or radiculopathy, lumbar region: Secondary | ICD-10-CM | POA: Diagnosis not present

## 2017-12-05 DIAGNOSIS — M6530 Trigger finger, unspecified finger: Secondary | ICD-10-CM | POA: Diagnosis not present

## 2017-12-05 DIAGNOSIS — M19049 Primary osteoarthritis, unspecified hand: Secondary | ICD-10-CM | POA: Diagnosis not present

## 2017-12-05 DIAGNOSIS — Z79899 Other long term (current) drug therapy: Secondary | ICD-10-CM | POA: Diagnosis not present

## 2017-12-05 DIAGNOSIS — M47816 Spondylosis without myelopathy or radiculopathy, lumbar region: Secondary | ICD-10-CM | POA: Diagnosis not present

## 2017-12-05 DIAGNOSIS — M5416 Radiculopathy, lumbar region: Secondary | ICD-10-CM | POA: Diagnosis not present

## 2017-12-05 DIAGNOSIS — G894 Chronic pain syndrome: Secondary | ICD-10-CM | POA: Diagnosis not present

## 2017-12-05 DIAGNOSIS — G2581 Restless legs syndrome: Secondary | ICD-10-CM | POA: Diagnosis not present

## 2017-12-11 DIAGNOSIS — M1811 Unilateral primary osteoarthritis of first carpometacarpal joint, right hand: Secondary | ICD-10-CM | POA: Diagnosis not present

## 2017-12-11 DIAGNOSIS — M19041 Primary osteoarthritis, right hand: Secondary | ICD-10-CM | POA: Diagnosis not present

## 2017-12-11 DIAGNOSIS — R69 Illness, unspecified: Secondary | ICD-10-CM | POA: Diagnosis not present

## 2017-12-18 DIAGNOSIS — M47817 Spondylosis without myelopathy or radiculopathy, lumbosacral region: Secondary | ICD-10-CM | POA: Diagnosis not present

## 2017-12-23 DIAGNOSIS — Z79899 Other long term (current) drug therapy: Secondary | ICD-10-CM | POA: Diagnosis not present

## 2017-12-25 ENCOUNTER — Ambulatory Visit (INDEPENDENT_AMBULATORY_CARE_PROVIDER_SITE_OTHER): Payer: Medicare HMO

## 2017-12-25 VITALS — BP 128/72 | HR 76 | Temp 97.9°F | Resp 16 | Ht 65.0 in | Wt 157.2 lb

## 2017-12-25 DIAGNOSIS — Z1159 Encounter for screening for other viral diseases: Secondary | ICD-10-CM | POA: Diagnosis not present

## 2017-12-25 DIAGNOSIS — Z0000 Encounter for general adult medical examination without abnormal findings: Secondary | ICD-10-CM

## 2017-12-25 DIAGNOSIS — Z114 Encounter for screening for human immunodeficiency virus [HIV]: Secondary | ICD-10-CM

## 2017-12-25 DIAGNOSIS — R69 Illness, unspecified: Secondary | ICD-10-CM | POA: Diagnosis not present

## 2017-12-25 NOTE — Progress Notes (Signed)
Subjective:   Hannah [name] is a 62 y.o. female who presents for Medicare Annual (Subsequent) preventive examination.  Review of Systems:   Cardiac Risk Factors include: diabetes mellitus;dyslipidemia;hypertension     Objective:     Vitals: BP 128/72 (BP Location: Right Arm, Patient Position: Sitting, Cuff Size: Normal)   Pulse 76   Temp 97.9 F (36.6 C) (Oral)   Resp 16   Ht 5\' 5"  (1.651 m)   Wt 157 lb 3.2 oz (71.3 kg)   BMI 26.16 kg/m   Body mass index is 26.16 kg/m.  Advanced Directives 12/25/2017 12/24/2016 03/05/2016 03/28/2015 11/24/2014  Does Patient Have a Medical Advance Directive? No No No No No  Would patient like information on creating a medical advance directive? Yes (MAU/Ambulatory/Procedural Areas - Information given) No - Patient declined No - Patient declined No - patient declined information Yes - Educational materials given    Tobacco Social History   Tobacco Use  Smoking Status Former Smoker  . Packs/day: 0.50  . Years: 20.00  . Pack years: 10.00  . Types: Cigarettes  . Last attempt to quit: 03/05/2016  . Years since quitting: 1.8  Smokeless Tobacco Never Used     Counseling given: Not Answered   Clinical Intake:  Pre-visit preparation completed: Yes  Pain : 0-10 Pain Score: 1  Pain Type: Chronic pain Pain Location: Back Pain Orientation: Lower Pain Descriptors / Indicators: Sore Pain Onset: More than a month ago Pain Frequency: Constant Pain Relieving Factors: pain relivers and muscle relaxers   Nutrition Risk Assessment:  Has the patient had any N/V/D within the last 2 months?  Yes  - mild diarrhea while on antibiotic Does the patient have any non-healing wounds?  No  Has the patient had any unintentional weight loss or weight gain?  No   Diabetes:  Is the patient diabetic?  Yes  If diabetic, was a CBG obtained today?  No  Did the patient bring in their glucometer from home?  No  How often do you monitor your CBG's? Once daily  fasting in the morning.   Financial Strains and Diabetes Management:  Are you having any financial strains with the device, your supplies or your medication? No .  Does the patient want to be seen by Chronic Care Management for management of their diabetes?  No  Would the patient like to be referred to a Nutritionist or for Diabetic Management?  No   Diabetic Exams:  Diabetic Eye Exam: Overdue for diabetic eye exam. Pt has been advised about the importance in completing this exam.   Diabetic Foot Exam: Completed 11/19/17. Pain Relieving Factors: pain relivers and muscle relaxers  Nutritional Status: BMI 25 -29 Overweight Nutritional Risks: None Diabetes: Yes CBG done?: No Did pt. bring in CBG monitor from home?: No  How often do you need to have someone help you when you read instructions, pamphlets, or other written materials from your doctor or pharmacy?: 1 - Never What is the last grade level you completed in school?: ged     Information entered by :: Clemetine Marker LPN  Past Medical History:  Diagnosis Date  . Arthritis   . Diabetes mellitus without complication ([place])   . Diet controlled gestational diabetes mellitus in puerperium   . Emphysema of lung ([place])   . 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 GERD (gastroesophageal reflux disease)    Past Surgical History:  Procedure Laterality Date  . COLONOSCOPY WITH PROPOFOL N/A 03/05/2016  Procedure: COLONOSCOPY WITH PROPOFOL;  Surgeon: [name], MD;  Location: [facility];  Service: Endoscopy;  Laterality: N/A;  . HIP FRACTURE SURGERY Right 03/2013  . INNER EAR SURGERY Bilateral   . REPLACEMENT TOTAL KNEE Right 02/2015  . SHOULDER SURGERY Bilateral    rotator cuff   Family History  Problem Relation Age of Onset  . Cervical cancer Mother   . Colon cancer Mother   . Diabetes Father   . Heart disease Father   . Breast cancer Sister 67  . Breast cancer Cousin 40       pat cousin  . Diabetes Sister   . Diabetes  Sister   . Congestive Heart Failure Paternal Grandmother   . Diabetes Paternal Grandmother    Social History   Socioeconomic History  . Marital status: Widowed    Spouse name: Not on file  . Number of children: 2  . Years of education: Not on file  . Highest education level: Some college, no degree  Occupational History  . Occupation: retired  Scientific laboratory technician  . Financial resource strain: Somewhat hard  . Food insecurity:    Worry: Sometimes true    Inability: Never true  . Transportation needs:    Medical: No    Non-medical: No  Tobacco Use  . Smoking status: Former Smoker    Packs/day: 0.50    Years: 20.00    Pack years: 10.00    Types: Cigarettes    Last attempt to quit: 03/05/2016    Years since quitting: 1.8  . Smokeless tobacco: Never Used  Substance and Sexual Activity  . Alcohol use: No  . Drug use: No  . Sexual activity: Never  Lifestyle  . Physical activity:    Days per week: 0 days    Minutes per session: 0 min  . Stress: Not on file  Relationships  . Social connections:    Talks on phone: More than three times a week    Gets together: More than three times a week    Attends religious service: Never    Active member of club or organization: No    Attends meetings of clubs or organizations: Never    Relationship status: Widowed  Other Topics Concern  . Not on file  Social History Narrative  . Not on file    Outpatient Encounter Medications as of 12/25/2017  Medication Sig  . albuterol (PROVENTIL HFA;VENTOLIN HFA) 108 (90 Base) MCG/ACT inhaler Inhale 2 puffs into the lungs every 6 (six) hours as needed for wheezing or shortness of breath.  . ALPRAZolam (XANAX) 0.5 MG tablet AFTER CONSENT IS SIGNED GET STAFF APPROVAL AND TK 2 TS UTD BY STAFF. REPEAT ONLY AFTER DISCUSSED WITH STAFF  . atorvastatin (LIPITOR) 10 MG tablet Take 1 tablet (10 mg total) by mouth at bedtime.  Marland Kitchen atorvastatin (LIPITOR) 10 MG tablet TAKE 1 TABLET(10 MG) BY MOUTH AT BEDTIME  .  diclofenac sodium (VOLTAREN) 1 % GEL APP 4 GRAMS EXT AA QID UTD  . Fluticasone-Salmeterol (ADVAIR DISKUS) 250-50 MCG/DOSE AEPB Inhale 1 puff into the lungs 2 (two) times daily.  Marland Kitchen gabapentin (NEURONTIN) 600 MG tablet Take 1 tablet (600 mg total) by mouth 3 (three) times daily.  Marland Kitchen glucose blood (BAYER CONTOUR TEST) test strip BAYER CONTOUR TEST (In Vitro Strip)  1 (one) Strip daily for 50 days  Quantity: 50;  Refills: 5   Ordered :[id number]  Halina Maidens M.D.;  Started [id number] Active Comments: dx: 250.02  . metFORMIN (GLUCOPHAGE-XR)  500 MG 24 hr tablet Take 2 tablets (1,000 mg total) by mouth daily with breakfast.  . omeprazole (PRILOSEC) 20 MG capsule TAKE 1 CAPSULE BY MOUTH EVERY DAY  . oxyCODONE-acetaminophen (PERCOCET) 10-325 MG tablet TK 1 T PO Q 4 HOURS PRN FOR PAIN. MAX OF 4 TS PER DAY  . pramipexole (MIRAPEX) 0.125 MG tablet Mirapex 0.125 mg tablet  1 to 2 in pm for RLS  . tiZANidine (ZANAFLEX) 4 MG tablet Take 4 mg by mouth every 6 (six) hours as needed.   . Multiple Vitamin (MULTIVITAMIN WITH MINERALS) TABS tablet Take 1 tablet by mouth daily.  Marland Kitchen oxyCODONE-acetaminophen (PERCOCET/ROXICET) 5-325 MG per tablet Take 1-2 tablets by mouth every 6 (six) hours as needed for pain.   No facility-administered encounter medications on file as of 12/25/2017.     Activities of Daily Living In your present state of health, do you have any difficulty performing the following activities: 12/25/2017  Hearing? N  Comment declines hearing aids  Vision? N  Comment wears reading glasses  Difficulty concentrating or making decisions? N  Walking or climbing stairs? Y  Comment difficulty with stairs and impaired gait  Dressing or bathing? N  Doing errands, shopping? N  Preparing Food and eating ? N  Using the Toilet? N  In the past six months, have you accidently leaked urine? Y  Comment wears depends for protection; occaisonal leakage  Do you have problems with loss of bowel control? N    Managing your Medications? N  Managing your Finances? N  Housekeeping or managing your Housekeeping? N  Some recent data might be hidden    Patient Care Team: Glean Hess, MD as PCP - General (Internal Medicine) [place], Jimmy J, MD as Consulting Physician (Physical Medicine and Rehabilitation) Melvyn Novas, MD as Referring Physician (Orthopedic Surgery)    Assessment:   This is a routine wellness examination for [place].  Exercise Activities and Dietary recommendations Current Exercise Habits: The patient does not participate in regular exercise at present  Goals    . DIET - EAT MORE FRUITS AND VEGETABLES     Recommend 3-4 servings of fruits and vegetables per day.    . Prevent Falls     Recommend to remove items from home that may cause trips or slips       Fall Risk Fall Risk  12/25/2017 12/24/2016 11/24/2014  Falls in the past year? 0 Yes Yes  Number falls in past yr: - 2 or more -  Injury with Fall? - No -  Risk Factor Category  - High Fall Risk -  Comment - Spastic parapalegia -  Risk for fall due to : - Impaired balance/gait;Impaired mobility History of fall(s);Impaired balance/gait;Impaired mobility  Follow up - Education provided;Falls prevention discussed -   FALL RISK PREVENTION PERTAINING TO THE HOME:  Any stairs in or around the home WITH handrails? Yes  Home free of loose throw rugs in walkways, pet beds, electrical cords, etc? Yes  Adequate lighting in your home to reduce risk of falls? Yes   ASSISTIVE DEVICES UTILIZED TO PREVENT FALLS:  Life alert? No  Use of a cane, walker or w/c? Yes  Grab bars in the bathroom? No  Shower chair or bench in shower? Yes  Elevated toilet seat or a handicapped toilet? Yes   DME ORDERS:  DME order needed?  No   TIMED UP AND GO:  Was the test performed? Yes .  Length of time to ambulate 10 feet: 8  sec.   GAIT:  Appearance of gait:  Gait slow, steady and with the use of an assistive device.  Education: Fall  risk prevention has been discussed.  Intervention(s) required? No   Depression Screen PHQ 2/9 Scores 12/25/2017 12/24/2016 10/04/2016 08/25/2015  PHQ - 2 Score 6 0 1 1  PHQ- 9 Score 10 - - -     Cognitive Function - pt declined        Immunization History  Administered Date(s) Administered  . Influenza, Seasonal, Injecte, Preservative Fre 11/29/2011  . Influenza,inj,Quad PF,6+ Mos 11/24/2014, 11/25/2015, 12/24/2016, 12/11/2017  . Influenza,inj,quad, With Preservative 03/08/2017  . Pneumococcal Conjugate-13 11/24/2014  . Pneumococcal Polysaccharide-23 05/20/2012    Qualifies for Shingles Vaccine? Yes . Due for Shingrix. Education has been provided regarding the importance of this vaccine. Pt has been advised to call insurance company to determine out of pocket expense. Advised may also receive vaccine at local pharmacy or Health Dept. Verbalized acceptance and understanding.  Tdap: Although this vaccine is not a covered service during a Wellness Exam, does the patient still wish to receive this vaccine today?  No .  Education has been provided regarding the importance of this vaccine. Advised may receive this vaccine at local pharmacy or Health Dept. Aware to provide a copy of the vaccination record if obtained from local pharmacy or Health Dept. Verbalized acceptance and understanding.  Flu Vaccine: Up to date  Pneumococcal Vaccine: Due after age 69   Screening Tests Health Maintenance  Topic Date Due  . HIV Screening  08/06/1970  . TETANUS/TDAP  08/06/1974  . INFLUENZA VACCINE  09/19/2017  . OPHTHALMOLOGY EXAM  12/25/2017 (Originally 08/05/1965)  . Hepatitis C Screening  03/27/2020 (Originally 1955-07-11)  . URINE MICROALBUMIN  03/18/2018  . MAMMOGRAM  04/17/2018  . HEMOGLOBIN A1C  05/21/2018  . FOOT EXAM  11/20/2018  . PAP SMEAR  03/18/2020  . COLONOSCOPY  03/05/2021  . PNEUMOCOCCAL POLYSACCHARIDE VACCINE AGE 61-64 HIGH RISK  Completed    Cancer Screenings:  Colorectal  Screening: Completed 03/05/16. Repeat every 5 years;   Mammogram: Completed 04/17/17. Repeat every year;  Bone Density: to be completed after age 62.  Lung Cancer Screening: (Low Dose CT Chest recommended if Age 55-80 years, 30 pack-year currently smoking OR have quit w/in 15years.) does not qualify.   Lung Cancer Screening Referral: An Epic message has been sent to Burgess Estelle, RN (Oncology Nurse Navigator) regarding the possible need for this exam. Raquel Sarna will review the patient's chart to determine if the patient truly qualifies for the exam. If the patient qualifies, Raquel Sarna will order the Low Dose CT of the chest to facilitate the scheduling of this exam.  Additional Screening:  Hepatitis C Screening: does qualify; ordered today to complete with future bloodwork  Vision Screening: Recommended annual ophthalmology exams for early detection of glaucoma and other disorders of the eye. Is the patient up to date with their annual eye exam?  No  Who is the provider or what is the name of the office in which the pt attends annual eye exams? Dr. Wyatt Portela - advised pt to scheduled appt with their office.  Dental Screening: Recommended annual dental exams for proper oral hygiene  Community Resource Referral:  CRR required this visit?  No  [place] [facility] resources provided and advised pt to contact us if additional resources requested.      Plan:  I have personally reviewed and addressed the Medicare Annual Wellness questionnaire and have noted the following  in the patient's chart:  A. Medical and social history B. Use of alcohol, tobacco or illicit drugs  C. Current medications and supplements D. Functional ability and status E.  Nutritional status F.  Physical activity G. Advance directives H. List of other physicians I.  Hospitalizations, surgeries, and ER visits in previous 12 months J.  [place] such as hearing and vision if needed, cognitive and  depression L. Referrals and appointments   In addition, I have reviewed and discussed with patient certain preventive protocols, quality metrics, and best practice recommendations. A written personalized care plan for preventive services as well as general preventive health recommendations were provided to patient.   Signed,  Clemetine Marker, LPN Nurse Health Advisor   Nurse Notes: pt c/o of intermittent left side abdominal "grabbing" pain and cramping that typically occurs when having a bowel movement or reaching from left side to right side. Pt concerned due to bowel issues with her brothers that required surgery. Advised pt to contact office if symptoms worsen or persist but currently having regular bowel movements and tolerating pain normally. Pt had a PHQ9 score of 10 with no hx of depression or medications; pt aware of not feeling motivated and having little interest in things over the past couple of weeks due to anniversary of husband's death on 2022-12-23 but she is starting to feel better. Advised to contact us if symptoms do not resolve.

## 2017-12-25 NOTE — Patient Instructions (Signed)
Ms. Aron , Thank you for taking time to come for your Medicare Wellness Visit. I appreciate your ongoing commitment to your health goals. Please review the following plan we discussed and let me know if I can assist you in the future.   Screening recommendations/referrals: Colonoscopy: completed 03/05/16 due every 5 years Mammogram: completed 04/17/17 repeat every year Recommended yearly ophthalmology/optometry visit for glaucoma screening and checkup Recommended yearly dental visit for hygiene and checkup  Vaccinations: Influenza vaccine: completed 12/11/17 Pneumococcal vaccine: due after age 33 Tdap vaccine: due - pt requests to receive at next visit Shingles vaccine: Shingrix discussed   Advanced directives: Advance directive discussed with you today. I have provided a copy for you to complete at home and have notarized. Once this is complete please bring a copy in to our office so we can scan it into your chart.  Conditions/risks identified: recommend eating 3-4 servings of fruits and vegetables per day  Next appointment: 04/25/2018 9:00 Dr. Army Melia  Preventive Care 40-64 Years, Female Preventive care refers to lifestyle choices and visits with your health care provider that can promote health and wellness. What does preventive care include?  A yearly physical exam. This is also called an annual well check.  Dental exams once or twice a year.  Routine eye exams. Ask your health care provider how often you should have your eyes checked.  Personal lifestyle choices, including:  Daily care of your teeth and gums.  Regular physical activity.  Eating a healthy diet.  Avoiding tobacco and drug use.  Limiting alcohol use.  Practicing safe sex.  Taking low-dose aspirin daily starting at age 70.  Taking vitamin and mineral supplements as recommended by your health care provider. What happens during an annual well check? The services and screenings done by your health care  provider during your annual well check will depend on your age, overall health, lifestyle risk factors, and family history of disease. Counseling  Your health care provider may ask you questions about your:  Alcohol use.  Tobacco use.  Drug use.  Emotional well-being.  Home and relationship well-being.  Sexual activity.  Eating habits.  Work and work Statistician.  Method of birth control.  Menstrual cycle.  Pregnancy history. Screening  You may have the following tests or measurements:  Height, weight, and BMI.  Blood pressure.  Lipid and cholesterol levels. These may be checked every 5 years, or more frequently if you are over 78 years old.  Skin check.  Lung cancer screening. You may have this screening every year starting at age 103 if you have a 30-pack-year history of smoking and currently smoke or have quit within the past 15 years.  Fecal occult blood test (FOBT) of the stool. You may have this test every year starting at age 60.  Flexible sigmoidoscopy or colonoscopy. You may have a sigmoidoscopy every 5 years or a colonoscopy every 10 years starting at age 59.  Hepatitis C blood test.  Hepatitis B blood test.  Sexually transmitted disease (STD) testing.  Diabetes screening. This is done by checking your blood sugar (glucose) after you have not eaten for a while (fasting). You may have this done every 1-3 years.  Mammogram. This may be done every 1-2 years. Talk to your health care provider about when you should start having regular mammograms. This may depend on whether you have a family history of breast cancer.  BRCA-related cancer screening. This may be done if you have a family history of breast,  ovarian, tubal, or peritoneal cancers.  Pelvic exam and Pap test. This may be done every 3 years starting at age 83. Starting at age 43, this may be done every 5 years if you have a Pap test in combination with an HPV test.  Bone density scan. This is done  to screen for osteoporosis. You may have this scan if you are at high risk for osteoporosis. Discuss your test results, treatment options, and if necessary, the need for more tests with your health care provider. Vaccines  Your health care provider may recommend certain vaccines, such as:  Influenza vaccine. This is recommended every year.  Tetanus, diphtheria, and acellular pertussis (Tdap, Td) vaccine. You may need a Td booster every 10 years.  Zoster vaccine. You may need this after age 67.  Pneumococcal 13-valent conjugate (PCV13) vaccine. You may need this if you have certain conditions and were not previously vaccinated.  Pneumococcal polysaccharide (PPSV23) vaccine. You may need one or two doses if you smoke cigarettes or if you have certain conditions. Talk to your health care provider about which screenings and vaccines you need and how often you need them. This information is not intended to replace advice given to you by your health care provider. Make sure you discuss any questions you have with your health care provider. Document Released: 03/04/2015 Document Revised: 10/26/2015 Document Reviewed: 12/07/2014 Elsevier Interactive Patient Education  2017 [place] Prevention in the Home Falls can cause injuries. They can happen to people of all ages. There are many things you can do to make your home safe and to help prevent falls. What can I do on the outside of my home?  Regularly fix the edges of walkways and driveways and fix any cracks.  Remove anything that might make you trip as you walk through a door, such as a raised step or threshold.  Trim any bushes or trees on the path to your home.  Use bright outdoor lighting.  Clear any walking paths of anything that might make someone trip, such as rocks or tools.  Regularly check to see if handrails are loose or broken. Make sure that both sides of any steps have handrails.  Any raised decks and porches  should have guardrails on the edges.  Have any leaves, snow, or ice cleared regularly.  Use sand or salt on walking paths during winter.  Clean up any spills in your garage right away. This includes oil or grease spills. What can I do in the bathroom?  Use night lights.  Install grab bars by the toilet and in the tub and shower. Do not use towel bars as grab bars.  Use non-skid mats or decals in the tub or shower.  If you need to sit down in the shower, use a plastic, non-slip stool.  Keep the floor dry. Clean up any water that spills on the floor as soon as it happens.  Remove soap buildup in the tub or shower regularly.  Attach bath mats securely with double-sided non-slip rug tape.  Do not have throw rugs and other things on the floor that can make you trip. What can I do in the bedroom?  Use night lights.  Make sure that you have a light by your bed that is easy to reach.  Do not use any sheets or blankets that are too big for your bed. They should not hang down onto the floor.  Have a firm chair that has side  arms. You can use this for support while you get dressed.  Do not have throw rugs and other things on the floor that can make you trip. What can I do in the kitchen?  Clean up any spills right away.  Avoid walking on wet floors.  Keep items that you use a lot in easy-to-reach places.  If you need to reach something above you, use a strong step stool that has a grab bar.  Keep electrical cords out of the way.  Do not use floor polish or wax that makes floors slippery. If you must use wax, use non-skid floor wax.  Do not have throw rugs and other things on the floor that can make you trip. What can I do with my stairs?  Do not leave any items on the stairs.  Make sure that there are handrails on both sides of the stairs and use them. Fix handrails that are broken or loose. Make sure that handrails are as long as the stairways.  Check any carpeting to  make sure that it is firmly attached to the stairs. Fix any carpet that is loose or worn.  Avoid having throw rugs at the top or bottom of the stairs. If you do have throw rugs, attach them to the floor with carpet tape.  Make sure that you have a light switch at the top of the stairs and the bottom of the stairs. If you do not have them, ask someone to add them for you. What else can I do to help prevent falls?  Wear shoes that:  Do not have high heels.  Have rubber bottoms.  Are comfortable and fit you well.  Are closed at the toe. Do not wear sandals.  If you use a stepladder:  Make sure that it is fully opened. Do not climb a closed stepladder.  Make sure that both sides of the stepladder are locked into place.  Ask someone to hold it for you, if possible.  Clearly mark and make sure that you can see:  Any grab bars or handrails.  First and last steps.  Where the edge of each step is.  Use tools that help you move around (mobility aids) if they are needed. These include:  Canes.  Walkers.  Scooters.  Crutches.  Turn on the lights when you go into a dark area. Replace any light bulbs as soon as they burn out.  Set up your furniture so you have a clear path. Avoid moving your furniture around.  If any of your floors are uneven, fix them.  If there are any pets around you, be aware of where they are.  Review your medicines with your doctor. Some medicines can make you feel dizzy. This can increase your chance of falling. Ask your doctor what other things that you can do to help prevent falls. This information is not intended to replace advice given to you by your health care provider. Make sure you discuss any questions you have with your health care provider. Document Released: 12/02/2008 Document Revised: 07/14/2015 Document Reviewed: 03/12/2014 Elsevier Interactive Patient Education  2017 [place] Name: [facility] Agency Address: [address] [place], [place] [address] Phone: [phone] Website: www.alamanceservices.org Service(s) Offered: Housing services, self-sufficiency, congregate meal program, weatherization program, Museum/gallery exhibitions officer program, emergency food assistance, housing counseling, home ownership program, wheels -towork program. Meals free for 60 and older at various locations from 9am-1pm, Monday-Friday: [employer],  [place], [place], [address], [place] [facility], [address], [place] [phone] The [address], [address], [place], [place]   Agency Name: [facility] on Wheels Address: [place] [address] [place], [place], [place] [address] Phone: [phone] Website: www.alamancemow.org Service(s) Offered: Home delivered hot, frozen, and emergency meals. Grocery assistance program which matches volunteers one-on-one with seniors unable to grocery shop for themselves. Must be 60 years and older; less than 20 hours of in-home aide service, limited or no driving ability; live alone or with someone with a disability; live in [place].       Agency Name: Software engineer ([place]) Address: [address], [place], [place] [address] Phone: [phone] Service(s) Offered: Food is served to [place], homeless, elderly, and low income people in the community every Saturday (11:30 am-12:30 pm) and Sunday (12:30 pm-1:30pm). Volunteers also offer help and encouragement in seeking employment, and spiritual guidance.  [place] Assistance:  [facility] [phone] www.[email or web address] Services Include: Housing counseling, home ownership, senior nutrition, asset development, emergency food, crisis information and referral, self sufficiency, and LIEAP programs.  [place] on  Wheels [phone] www.[email or web address] Services Include: Home-delivered meals, frozen home-delivered meals, and winter emergency meals. (Note: Call for more information on additional supportive services.) 7 Category continues on next page. [employer] [phone]  www.alliedchurches.org Services Include: Job and education services, shelter, meals and emergency food. Weekday lunch and dinner meal program for shelter residents and community members (hot breakfast daily for shelter residents).  [employer] [phone]  www. RecruitSuit.co.za Services Include: Building surveyor. [facility] [employer] [phone] [email or web address].[email or web address]/bam/caring-kitchen Services Include: WEEKEND Meals Saturdays 11:30 am - 12:30 pm and Sundays 12:30 - 1:30 pm.  Division of Social Services Special Nutrition Assistance Program (SNAP) Food Stamps Program [phone]  [phone] https://gibson.com/- food-stamps Services Include: Nutrition supportive programs (Note: Food Stamps.)  [place] [place], [place] We offer both emergency food assistance and also have a monthly food assistance program. Our monthly program signup day and times are: Monday 10:00am - 12:00pm; 7:00pm - 9:00pmFriday 10:00am - 12:00pmFood distribution is base on first come first serve basis Call if you have any questions.  [place] [phone]  www.hbcburlington.net Services Include: Food pantry Wednesdays 10 am - Noon.  [employer] [employer] [phone]  www.salvationarmy.org Services Include: Utility assistance, food pantry, life-sustaining medicine, and rental assistance (when funds are available

## 2018-01-01 ENCOUNTER — Other Ambulatory Visit: Payer: Self-pay

## 2018-01-01 MED ORDER — GLUCOSE BLOOD VI STRP: 1.0000 | ORAL_STRIP | Freq: Every day | 12 refills | Status: DC

## 2018-01-10 ENCOUNTER — Other Ambulatory Visit: Payer: Self-pay

## 2018-01-10 DIAGNOSIS — R69 Illness, unspecified: Secondary | ICD-10-CM | POA: Diagnosis not present

## 2018-01-10 MED ORDER — ONETOUCH VERIO W/DEVICE KIT: 1.0000 | PACK | Freq: Two times a day (BID) | 0 refills | Status: DC

## 2018-03-07 DIAGNOSIS — M6530 Trigger finger, unspecified finger: Secondary | ICD-10-CM | POA: Diagnosis not present

## 2018-03-07 DIAGNOSIS — M47816 Spondylosis without myelopathy or radiculopathy, lumbar region: Secondary | ICD-10-CM | POA: Diagnosis not present

## 2018-03-07 DIAGNOSIS — G2581 Restless legs syndrome: Secondary | ICD-10-CM | POA: Diagnosis not present

## 2018-03-07 DIAGNOSIS — M19049 Primary osteoarthritis, unspecified hand: Secondary | ICD-10-CM | POA: Diagnosis not present

## 2018-03-07 DIAGNOSIS — Z79899 Other long term (current) drug therapy: Secondary | ICD-10-CM | POA: Diagnosis not present

## 2018-03-07 DIAGNOSIS — G894 Chronic pain syndrome: Secondary | ICD-10-CM | POA: Diagnosis not present

## 2018-03-07 DIAGNOSIS — M5416 Radiculopathy, lumbar region: Secondary | ICD-10-CM | POA: Diagnosis not present

## 2018-04-04 DIAGNOSIS — R32 Unspecified urinary incontinence: Secondary | ICD-10-CM | POA: Diagnosis not present

## 2018-04-04 DIAGNOSIS — E1151 Type 2 diabetes mellitus with diabetic peripheral angiopathy without gangrene: Secondary | ICD-10-CM | POA: Diagnosis not present

## 2018-04-04 DIAGNOSIS — Z7951 Long term (current) use of inhaled steroids: Secondary | ICD-10-CM | POA: Diagnosis not present

## 2018-04-04 DIAGNOSIS — J449 Chronic obstructive pulmonary disease, unspecified: Secondary | ICD-10-CM | POA: Diagnosis not present

## 2018-04-04 DIAGNOSIS — G8929 Other chronic pain: Secondary | ICD-10-CM | POA: Diagnosis not present

## 2018-04-04 DIAGNOSIS — G2581 Restless legs syndrome: Secondary | ICD-10-CM | POA: Diagnosis not present

## 2018-04-04 DIAGNOSIS — R030 Elevated blood-pressure reading, without diagnosis of hypertension: Secondary | ICD-10-CM | POA: Diagnosis not present

## 2018-04-04 DIAGNOSIS — K219 Gastro-esophageal reflux disease without esophagitis: Secondary | ICD-10-CM | POA: Diagnosis not present

## 2018-04-04 DIAGNOSIS — M1990 Unspecified osteoarthritis, unspecified site: Secondary | ICD-10-CM | POA: Diagnosis not present

## 2018-04-04 DIAGNOSIS — M549 Dorsalgia, unspecified: Secondary | ICD-10-CM | POA: Diagnosis not present

## 2018-04-07 ENCOUNTER — Other Ambulatory Visit: Payer: Self-pay | Admitting: Internal Medicine

## 2018-04-25 ENCOUNTER — Other Ambulatory Visit: Payer: Self-pay

## 2018-04-25 ENCOUNTER — Ambulatory Visit (INDEPENDENT_AMBULATORY_CARE_PROVIDER_SITE_OTHER): Payer: Medicare HMO | Admitting: Internal Medicine

## 2018-04-25 ENCOUNTER — Encounter: Payer: Self-pay | Admitting: Internal Medicine

## 2018-04-25 ENCOUNTER — Other Ambulatory Visit: Payer: Self-pay | Admitting: Internal Medicine

## 2018-04-25 VITALS — BP 120/80 | HR 68 | Resp 16 | Ht 65.0 in | Wt 162.0 lb

## 2018-04-25 DIAGNOSIS — J439 Emphysema, unspecified: Secondary | ICD-10-CM

## 2018-04-25 DIAGNOSIS — E785 Hyperlipidemia, unspecified: Secondary | ICD-10-CM

## 2018-04-25 DIAGNOSIS — Z1231 Encounter for screening mammogram for malignant neoplasm of breast: Secondary | ICD-10-CM

## 2018-04-25 DIAGNOSIS — G114 Hereditary spastic paraplegia: Secondary | ICD-10-CM | POA: Diagnosis not present

## 2018-04-25 DIAGNOSIS — E118 Type 2 diabetes mellitus with unspecified complications: Secondary | ICD-10-CM | POA: Diagnosis not present

## 2018-04-25 DIAGNOSIS — Z0000 Encounter for general adult medical examination without abnormal findings: Secondary | ICD-10-CM | POA: Diagnosis not present

## 2018-04-25 DIAGNOSIS — R252 Cramp and spasm: Secondary | ICD-10-CM | POA: Diagnosis not present

## 2018-04-25 DIAGNOSIS — E1169 Type 2 diabetes mellitus with other specified complication: Secondary | ICD-10-CM | POA: Diagnosis not present

## 2018-04-25 LAB — POCT URINALYSIS DIPSTICK
BILIRUBIN UA: NEGATIVE
Blood, UA: NEGATIVE
Glucose, UA: NEGATIVE
Ketones, UA: NEGATIVE
Nitrite, UA: NEGATIVE
Protein, UA: NEGATIVE
Spec Grav, UA: 1.01 (ref 1.010–1.025)
Urobilinogen, UA: 0.2 E.U./dL
pH, UA: 7 (ref 5.0–8.0)

## 2018-04-25 MED ORDER — GLUCOSE BLOOD VI STRP: ORAL_STRIP | 12 refills | Status: DC

## 2018-04-25 NOTE — Progress Notes (Signed)
Date:  04/25/2018   Name:  Hannah [name]   DOB:  1955/02/23   MRN:  [id number]   Chief Complaint: Annual Exam Hannah [name] is a 63 y.o. female who presents today for her Complete Annual Exam. She feels fairly well. She reports exercising none due to neurological disease. She reports she is sleeping fairly well. She is due for a mammogram.  Diabetes  She presents for her follow-up diabetic visit. She has type 2 diabetes mellitus. Her disease course has been stable. Pertinent negatives for hypoglycemia include no dizziness, headaches, nervousness/anxiousness, speech difficulty or tremors. Associated symptoms include weakness. Pertinent negatives for diabetes include no chest pain, no fatigue, no polydipsia and no polyuria. Current diabetic treatment includes oral agent (monotherapy). She is compliant with treatment all of the time. She is following a generally healthy diet. An ACE inhibitor/angiotensin II receptor blocker is being taken. Eye exam is not current.  Hyperlipidemia  This is a chronic problem. The problem is controlled. Associated symptoms include myalgias (muscle cramps in her left abdomen with stretching or reaching). Pertinent negatives include no chest pain or shortness of breath. Current antihyperlipidemic treatment includes statins. The current treatment provides significant improvement of lipids.  RLS - sleeping much better now that sx are controlled with Requip.  This is prescribed by pain management. Spastic paraparesis - stable LE weakness.  She has mainly pain in multiple joints but not much numbness.  She remains very independent, ambulates with a rolator.  Lab Results  Component Value Date   HGBA1C 8.3 (H) 11/19/2017   Lab Results  Component Value Date   CREATININE 0.56 (L) 11/19/2017   BUN 7 (L) 11/19/2017   NA 135 11/19/2017   K 4.3 11/19/2017   CL 96 11/19/2017   CO2 20 11/19/2017     Review of Systems  Constitutional: Negative for chills, fatigue and  fever.  HENT: Negative for congestion, hearing loss, tinnitus, trouble swallowing and voice change.   Eyes: Positive for visual disturbance.  Respiratory: Positive for cough and wheezing. Negative for chest tightness and shortness of breath.   Cardiovascular: Negative for chest pain, palpitations and leg swelling.  Gastrointestinal: Negative for abdominal pain, constipation, diarrhea and vomiting.  Endocrine: Negative for polydipsia and polyuria.  Genitourinary: Negative for dysuria, frequency, genital sores, vaginal bleeding and vaginal discharge.  Musculoskeletal: Positive for arthralgias, back pain, gait problem and myalgias (muscle cramps in her left abdomen with stretching or reaching). Negative for joint swelling.  Skin: Negative for color change and rash.  Neurological: Positive for weakness and numbness. Negative for dizziness, tremors, speech difficulty, light-headedness and headaches.  Hematological: Negative for adenopathy. Does not bruise/bleed easily.  Psychiatric/Behavioral: Negative for dysphoric mood and sleep disturbance. The patient is not nervous/anxious.     Patient Active Problem List   Diagnosis Date Noted  . 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 Special screening for malignant neoplasms, colon   . Benign neoplasm of descending colon   . Other osteoporosis without current pathological fracture 11/25/2015  . Hyperlipidemia associated with type 2 diabetes mellitus ([place]) 03/28/2015  . Dysphagia 11/11/2014  . 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 DM type 2, controlled, with complication ([place]) 30/16/0109  . Chronic obstructive pulmonary emphysema ([place]) 11/11/2014  . Tobacco use disorder, mild, 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  . Degenerative joint disease involving multiple joints 09/28/2013    Allergies  Allergen Reactions  . Levofloxacin Nausea And Vomiting    Past Surgical History:  Procedure Laterality  Date  . COLONOSCOPY  WITH PROPOFOL N/A 03/05/2016   Procedure: COLONOSCOPY WITH PROPOFOL;  Surgeon: [name], MD;  Location: [facility];  Service: Endoscopy;  Laterality: N/A;  . HIP FRACTURE SURGERY Right 03/2013  . INNER EAR SURGERY Bilateral   . REPLACEMENT TOTAL KNEE Right 02/2015  . SHOULDER SURGERY Bilateral    rotator cuff    Social History   Tobacco Use  . Smoking status: Former Smoker    Packs/day: 0.50    Years: 20.00    Pack years: 10.00    Types: Cigarettes    Last attempt to quit: 03/05/2016    Years since quitting: 2.1  . Smokeless tobacco: Never Used  Substance Use Topics  . Alcohol use: No  . Drug use: No     Medication list has been reviewed and updated.  Current Meds  Medication Sig  . albuterol (PROVENTIL HFA;VENTOLIN HFA) 108 (90 Base) MCG/ACT inhaler Inhale 2 puffs into the lungs every 6 (six) hours as needed for wheezing or shortness of breath.  Mar[place] Kitchen atorvastatin (LIPITOR) 10 MG tablet Take 1 tablet (10 mg total) by mouth at bedtime.  . Blood Glucose Monitoring Suppl (ONETOUCH VERIO) w/Device KIT 1 each by Does not apply route 2 (two) times daily. Test BS twice daily.  . diclofenac sodium (VOLTAREN) 1 % GEL APP 4 GRAMS EXT AA QID UTD  . Fluticasone-Salmeterol (ADVAIR DISKUS) 250-50 MCG/DOSE AEPB Inhale 1 puff into the lungs 2 (two) times daily.  Marland Kitchen gabapentin (NEURONTIN) 600 MG tablet Take 1 tablet (600 mg total) by mouth 3 (three) times daily.  Marland Kitchen glucose blood (BAYER CONTOUR TEST) test strip 1 each by Other route daily. Test BS once daily  . metFORMIN (GLUCOPHAGE-XR) 500 MG 24 hr tablet Take 2 tablets (1,000 mg total) by mouth daily with breakfast.  . oxyCODONE-acetaminophen (PERCOCET) 10-325 MG tablet TK 1 T PO Q 4 HOURS PRN FOR PAIN. MAX OF 4 TS PER DAY  . pramipexole (MIRAPEX) 0.125 MG tablet Mirapex 0.125 mg tablet  1 to 2 in pm for RLS  . tiZANidine (ZANAFLEX) 4 MG tablet Take 4 mg by mouth every 6 (six) hours as needed.   . [DISCONTINUED] Multiple Vitamin  (MULTIVITAMIN WITH MINERALS) TABS tablet Take 1 tablet by mouth daily.  . [DISCONTINUED] omeprazole (PRILOSEC) 20 MG capsule TAKE 1 CAPSULE BY MOUTH EVERY DAY    PHQ 2/9 Scores 04/25/2018 12/25/2017 12/24/2016 10/04/2016  PHQ - 2 Score 0 6 0 1  PHQ- 9 Score 2 10 - -   Wt Readings from Last 3 Encounters:  04/25/18 162 lb (73.5 kg)  12/25/17 157 lb 3.2 oz (71.3 kg)  11/19/17 153 lb (69.4 kg)    Physical Exam Vitals signs and nursing note reviewed.  Constitutional:      General: She is not in acute distress.    Appearance: She is well-developed.  HENT:     Head: Normocephalic and atraumatic.     Right Ear: Tympanic membrane and ear canal normal.     Left Ear: Tympanic membrane and ear canal normal.     Nose:     Right Sinus: No maxillary sinus tenderness.     Left Sinus: No maxillary sinus tenderness.     Mouth/Throat:     Pharynx: Uvula midline.  Eyes:     General: No scleral icterus.       Right eye: No discharge.        Left eye: No discharge.     Conjunctiva/sclera: Conjunctivae  normal.  Neck:     Musculoskeletal: Normal range of motion. No erythema.     Thyroid: No thyromegaly.     Vascular: No carotid bruit.  Cardiovascular:     Rate and Rhythm: Normal rate and regular rhythm.  No extrasystoles are present.    Pulses:          Dorsalis pedis pulses are 1+ on the right side and 1+ on the left side.       Posterior tibial pulses are 1+ on the right side and 1+ on the left side.     Heart sounds: Normal heart sounds. No murmur.  Pulmonary:     Effort: Pulmonary effort is normal. No respiratory distress.     Breath sounds: Decreased breath sounds present. No wheezing or rhonchi.  Chest:     Breasts:        Right: No mass, nipple discharge, skin change or tenderness.        Left: No mass, nipple discharge, skin change or tenderness.  Abdominal:     General: Bowel sounds are normal.     Palpations: Abdomen is soft.     Tenderness: There is no abdominal tenderness.    Musculoskeletal: Normal range of motion.     Right lower leg: No edema.     Left lower leg: No edema.  Lymphadenopathy:     Cervical: No cervical adenopathy.  Skin:    General: Skin is warm and dry.     Findings: No rash.  Neurological:     Mental Status: She is alert and oriented to person, place, and time.     Cranial Nerves: No cranial nerve deficit.     Sensory: No sensory deficit.     Motor: Weakness and atrophy present. No tremor.     Gait: Gait abnormal.     Deep Tendon Reflexes: Reflexes are normal and symmetric.  Psychiatric:        Attention and Perception: Attention normal.        Mood and Affect: Mood normal.        Speech: Speech normal.        Behavior: Behavior normal.        Thought Content: Thought content normal.     BP 120/80   Pulse 68   Resp 16   Ht '5\' 5"'  (1.651 m)   Wt 162 lb (73.5 kg)   SpO2 95%   BMI 26.96 kg/m   Assessment and Plan: 1. Annual physical exam Stable exam, work on healthy diet Consider regular exercise such as pool therapy - POCT urinalysis dipstick  2. Encounter for screening mammogram for breast cancer - MM 3D SCREEN BREAST BILATERAL; Future  3. DM type 2, controlled, with complication ([place]) Fair control - Comprehensive metabolic panel - Hemoglobin A1c - TSH - Microalbumin / creatinine urine ratio - glucose blood test strip; Test once a day  Dispense: 100 each; Refill: 12  4. Pulmonary emphysema, unspecified emphysema type ([place]) Stable, continue Advair - CBC with Differential/Platelet  5. 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 Hyperlipidemia associated with type 2 diabetes mellitus (H[facility]) Statin therapy Check labs and advise if dose change is needed - Comprehensive metabolic panel - Lipid panel  7. Muscle cramps Check magnesium level Recommend more regular exercise - Magnesium   Partially dictated using Editor, commissioning. Any errors are  unintentional.  Halina Maidens, MD [facility]  04/25/2018

## 2018-04-25 NOTE — Patient Instructions (Signed)
Schedule Diabetic Eye exam 

## 2018-04-26 LAB — COMPREHENSIVE METABOLIC PANEL
ALT: 20 IU/L (ref 0–32)
AST: 18 IU/L (ref 0–40)
Albumin/Globulin Ratio: 1.9 (ref 1.2–2.2)
Albumin: 4.2 g/dL (ref 3.8–4.8)
Alkaline Phosphatase: 102 IU/L (ref 39–117)
BUN/Creatinine Ratio: 14 (ref 12–28)
BUN: 8 mg/dL (ref 8–27)
Bilirubin Total: <0.2 mg/dL (ref 0.0–1.2)
CO2: 23 mmol/L (ref 20–29)
Calcium: 9.4 mg/dL (ref 8.7–10.3)
Chloride: 99 mmol/L (ref 96–106)
Creatinine, Ser: 0.59 mg/dL (ref 0.57–1.00)
GFR calc Af Amer: 114 mL/min/{1.73_m2} (ref >59)
GFR calc non Af Amer: 99 mL/min/{1.73_m2} (ref >59)
Globulin, Total: 2.2 g/dL (ref 1.5–4.5)
Glucose: 177 mg/dL — ABNORMAL HIGH (ref 65–99)
POTASSIUM: 4.3 mmol/L (ref 3.5–5.2)
Sodium: 139 mmol/L (ref 134–144)
Total Protein: 6.4 g/dL (ref 6.0–8.5)

## 2018-04-26 LAB — CBC WITH DIFFERENTIAL/PLATELET
BASOS ABS: 0.1 10*3/uL (ref 0.0–0.2)
Basos: 1 %
EOS (ABSOLUTE): 0.1 10*3/uL (ref 0.0–0.4)
Eos: 1 %
HEMOGLOBIN: 15.3 g/dL (ref 11.1–15.9)
Hematocrit: 45.3 % (ref 34.0–46.6)
IMMATURE GRANS (ABS): 0 10*3/uL (ref 0.0–0.1)
Immature Granulocytes: 0 %
LYMPHS: 38 %
Lymphocytes Absolute: 3.5 10*3/uL — ABNORMAL HIGH (ref 0.7–3.1)
MCH: 31.9 pg (ref 26.6–33.0)
MCHC: 33.8 g/dL (ref 31.5–35.7)
MCV: 94 fL (ref 79–97)
MONOCYTES: 7 %
Monocytes Absolute: 0.7 10*3/uL (ref 0.1–0.9)
Neutrophils Absolute: 4.9 10*3/uL (ref 1.4–7.0)
Neutrophils: 53 %
Platelets: 235 10*3/uL (ref 150–450)
RBC: 4.8 x10E6/uL (ref 3.77–5.28)
RDW: 11.5 % — ABNORMAL LOW (ref 11.7–15.4)
WBC: 9.3 10*3/uL (ref 3.4–10.8)

## 2018-04-26 LAB — MAGNESIUM: MAGNESIUM: 2 mg/dL (ref 1.6–2.3)

## 2018-04-26 LAB — MICROALBUMIN / CREATININE URINE RATIO
CREATININE, UR: 20.9 mg/dL
Microalb/Creat Ratio: <14 mg/g creat (ref 0–29)
Microalbumin, Urine: <3 ug/mL

## 2018-04-26 LAB — LIPID PANEL
CHOLESTEROL TOTAL: 229 mg/dL — AB (ref 100–199)
Chol/HDL Ratio: 4.2 ratio (ref 0.0–4.4)
HDL: 55 mg/dL (ref >39)
LDL Calculated: 150 mg/dL — ABNORMAL HIGH (ref 0–99)
Triglycerides: 119 mg/dL (ref 0–149)
VLDL Cholesterol Cal: 24 mg/dL (ref 5–40)

## 2018-04-26 LAB — HEMOGLOBIN A1C
Est. average glucose Bld gHb Est-mCnc: 226 mg/dL
Hgb A1c MFr Bld: 9.5 % — ABNORMAL HIGH (ref 4.8–5.6)

## 2018-04-26 LAB — TSH: TSH: 1.44 u[IU]/mL (ref 0.450–4.500)

## 2018-04-29 ENCOUNTER — Other Ambulatory Visit: Payer: Self-pay | Admitting: Internal Medicine

## 2018-04-29 DIAGNOSIS — E118 Type 2 diabetes mellitus with unspecified complications: Secondary | ICD-10-CM

## 2018-04-29 DIAGNOSIS — E785 Hyperlipidemia, unspecified: Principal | ICD-10-CM

## 2018-04-29 DIAGNOSIS — E1169 Type 2 diabetes mellitus with other specified complication: Secondary | ICD-10-CM

## 2018-04-29 MED ORDER — ATORVASTATIN CALCIUM 20 MG PO TABS: 20.0000 mg | ORAL_TABLET | Freq: Every day | ORAL | 5 refills | Status: DC

## 2018-04-29 MED ORDER — METFORMIN HCL ER 500 MG PO TB24: 1500.0000 mg | ORAL_TABLET | Freq: Every day | ORAL | 5 refills | Status: DC

## 2018-04-29 NOTE — Progress Notes (Signed)
Patient informed of lab results. Agrees to both new doses. Wants both sent to pharmacy.

## 2018-05-26 DIAGNOSIS — S61432A Puncture wound without foreign body of left hand, initial encounter: Secondary | ICD-10-CM | POA: Diagnosis not present

## 2018-05-26 DIAGNOSIS — S638X2A Sprain of other part of left wrist and hand, initial encounter: Secondary | ICD-10-CM | POA: Diagnosis not present

## 2018-06-03 DIAGNOSIS — S638X2A Sprain of other part of left wrist and hand, initial encounter: Secondary | ICD-10-CM | POA: Diagnosis not present

## 2018-06-03 DIAGNOSIS — S61432A Puncture wound without foreign body of left hand, initial encounter: Secondary | ICD-10-CM | POA: Diagnosis not present

## 2018-07-01 DIAGNOSIS — M5416 Radiculopathy, lumbar region: Secondary | ICD-10-CM | POA: Diagnosis not present

## 2018-07-01 DIAGNOSIS — M6530 Trigger finger, unspecified finger: Secondary | ICD-10-CM | POA: Diagnosis not present

## 2018-07-01 DIAGNOSIS — Z79899 Other long term (current) drug therapy: Secondary | ICD-10-CM | POA: Diagnosis not present

## 2018-07-01 DIAGNOSIS — G2581 Restless legs syndrome: Secondary | ICD-10-CM | POA: Diagnosis not present

## 2018-07-01 DIAGNOSIS — M19049 Primary osteoarthritis, unspecified hand: Secondary | ICD-10-CM | POA: Diagnosis not present

## 2018-07-01 DIAGNOSIS — G894 Chronic pain syndrome: Secondary | ICD-10-CM | POA: Diagnosis not present

## 2018-07-01 DIAGNOSIS — M47816 Spondylosis without myelopathy or radiculopathy, lumbar region: Secondary | ICD-10-CM | POA: Diagnosis not present

## 2018-07-19 ENCOUNTER — Other Ambulatory Visit: Payer: Self-pay

## 2018-07-19 DIAGNOSIS — L0291 Cutaneous abscess, unspecified: Secondary | ICD-10-CM | POA: Insufficient documentation

## 2018-07-19 DIAGNOSIS — Z5321 Procedure and treatment not carried out due to patient leaving prior to being seen by health care provider: Secondary | ICD-10-CM | POA: Diagnosis not present

## 2018-07-19 DIAGNOSIS — R739 Hyperglycemia, unspecified: Secondary | ICD-10-CM | POA: Insufficient documentation

## 2018-07-19 LAB — CBC
HCT: 44.2 % (ref 36.0–46.0)
Hemoglobin: 14.8 g/dL (ref 12.0–15.0)
MCH: 31.6 pg (ref 26.0–34.0)
MCHC: 33.5 g/dL (ref 30.0–36.0)
MCV: 94.2 fL (ref 80.0–100.0)
Platelets: 202 K/uL (ref 150–400)
RBC: 4.69 MIL/uL (ref 3.87–5.11)
RDW: 12.4 % (ref 11.5–15.5)
WBC: 12.9 K/uL — ABNORMAL HIGH (ref 4.0–10.5)
nRBC: 0 % (ref 0.0–0.2)

## 2018-07-19 LAB — BASIC METABOLIC PANEL
Anion gap: 14 (ref 5–15)
BUN: 6 mg/dL — ABNORMAL LOW (ref 8–23)
CO2: 19 mmol/L — ABNORMAL LOW (ref 22–32)
Calcium: 8.7 mg/dL — ABNORMAL LOW (ref 8.9–10.3)
Chloride: 108 mmol/L (ref 98–111)
Creatinine, Ser: 0.46 mg/dL (ref 0.44–1.00)
GFR calc Af Amer: >60 mL/min (ref >60)
GFR calc non Af Amer: >60 mL/min (ref >60)
Glucose, Bld: 320 mg/dL — ABNORMAL HIGH (ref 70–99)
Potassium: 3.8 mmol/L (ref 3.5–5.1)
Sodium: 141 mmol/L (ref 135–145)

## 2018-07-19 NOTE — ED Triage Notes (Signed)
Patient to ED via EMS for elevated sugar related to having multiple abscessed.  Patient reports has several areas on her lower abdomen and her left jaw.

## 2018-07-20 ENCOUNTER — Emergency Department
Admission: EM | Admit: 2018-07-20 | Discharge: 2018-07-20 | Discharge status: Against Medical Advice | Payer: Medicare HMO | Attending: Emergency Medicine | Admitting: Emergency Medicine

## 2018-07-20 NOTE — ED Notes (Signed)
Pt to the desk and reports she has called a cab to pick her up and does not wish to wait any longer.

## 2018-07-21 ENCOUNTER — Telehealth: Payer: Self-pay

## 2018-07-21 ENCOUNTER — Telehealth: Payer: Self-pay | Admitting: Emergency Medicine

## 2018-07-21 ENCOUNTER — Other Ambulatory Visit: Payer: Self-pay

## 2018-07-21 ENCOUNTER — Emergency Department
Admission: EM | Admit: 2018-07-21 | Discharge: 2018-07-21 | Disposition: A | Discharge status: Home / Self Care | Payer: Medicare HMO | Attending: Emergency Medicine | Admitting: Emergency Medicine

## 2018-07-21 ENCOUNTER — Encounter: Payer: Self-pay | Admitting: Emergency Medicine

## 2018-07-21 DIAGNOSIS — L02211 Cutaneous abscess of abdominal wall: Secondary | ICD-10-CM | POA: Diagnosis not present

## 2018-07-21 DIAGNOSIS — E119 Type 2 diabetes mellitus without complications: Secondary | ICD-10-CM | POA: Insufficient documentation

## 2018-07-21 DIAGNOSIS — L0211 Cutaneous abscess of neck: Secondary | ICD-10-CM | POA: Diagnosis present

## 2018-07-21 DIAGNOSIS — Z87891 Personal history of nicotine dependence: Secondary | ICD-10-CM | POA: Insufficient documentation

## 2018-07-21 DIAGNOSIS — R1030 Lower abdominal pain, unspecified: Secondary | ICD-10-CM | POA: Diagnosis not present

## 2018-07-21 DIAGNOSIS — Z79899 Other long term (current) drug therapy: Secondary | ICD-10-CM | POA: Diagnosis not present

## 2018-07-21 DIAGNOSIS — E118 Type 2 diabetes mellitus with unspecified complications: Secondary | ICD-10-CM

## 2018-07-21 LAB — CBC WITH DIFFERENTIAL/PLATELET
Abs Immature Granulocytes: 0.04 10*3/uL (ref 0.00–0.07)
Basophils Absolute: 0.1 10*3/uL (ref 0.0–0.1)
Basophils Relative: 0 %
Eosinophils Absolute: 0.2 10*3/uL (ref 0.0–0.5)
Eosinophils Relative: 1 %
HCT: 43.8 % (ref 36.0–46.0)
Hemoglobin: 14.7 g/dL (ref 12.0–15.0)
Immature Granulocytes: 0 %
Lymphocytes Relative: 36 %
Lymphs Abs: 4.3 10*3/uL — ABNORMAL HIGH (ref 0.7–4.0)
MCH: 31.7 pg (ref 26.0–34.0)
MCHC: 33.6 g/dL (ref 30.0–36.0)
MCV: 94.6 fL (ref 80.0–100.0)
Monocytes Absolute: 0.8 10*3/uL (ref 0.1–1.0)
Monocytes Relative: 7 %
Neutro Abs: 6.4 10*3/uL (ref 1.7–7.7)
Neutrophils Relative %: 56 %
Platelets: 246 10*3/uL (ref 150–400)
RBC: 4.63 MIL/uL (ref 3.87–5.11)
RDW: 12.4 % (ref 11.5–15.5)
WBC: 11.7 10*3/uL — ABNORMAL HIGH (ref 4.0–10.5)
nRBC: 0 % (ref 0.0–0.2)

## 2018-07-21 LAB — COMPREHENSIVE METABOLIC PANEL
ALT: 24 U/L (ref 0–44)
AST: 23 U/L (ref 15–41)
Albumin: 4.2 g/dL (ref 3.5–5.0)
Alkaline Phosphatase: 93 U/L (ref 38–126)
Anion gap: 9 (ref 5–15)
BUN: 7 mg/dL — ABNORMAL LOW (ref 8–23)
CO2: 24 mmol/L (ref 22–32)
Calcium: 9.5 mg/dL (ref 8.9–10.3)
Chloride: 104 mmol/L (ref 98–111)
Creatinine, Ser: 0.64 mg/dL (ref 0.44–1.00)
GFR calc Af Amer: >60 mL/min (ref >60)
GFR calc non Af Amer: >60 mL/min (ref >60)
Glucose, Bld: 181 mg/dL — ABNORMAL HIGH (ref 70–99)
Potassium: 3.8 mmol/L (ref 3.5–5.1)
Sodium: 137 mmol/L (ref 135–145)
Total Bilirubin: 0.3 mg/dL (ref 0.3–1.2)
Total Protein: 7.2 g/dL (ref 6.5–8.1)

## 2018-07-21 LAB — GLUCOSE, CAPILLARY: Glucose-Capillary: 174 mg/dL — ABNORMAL HIGH (ref 70–99)

## 2018-07-21 MED ORDER — CLINDAMYCIN HCL 150 MG PO CAPS
300.0000 mg | ORAL_CAPSULE | Freq: Once | ORAL | Status: AC
  Administered 2018-07-21: 300 mg via ORAL
  Filled 2018-07-21: qty 2

## 2018-07-21 MED ORDER — GLUCOSE BLOOD VI STRP: ORAL_STRIP | 12 refills | Status: DC

## 2018-07-21 MED ORDER — FLUCONAZOLE 150 MG PO TABS: 150.0000 mg | ORAL_TABLET | Freq: Once | ORAL | 0 refills | Status: AC

## 2018-07-21 MED ORDER — CLINDAMYCIN HCL 300 MG PO CAPS: 300.0000 mg | ORAL_CAPSULE | Freq: Three times a day (TID) | ORAL | 0 refills | Status: AC

## 2018-07-21 NOTE — Telephone Encounter (Signed)
Patient advised to go to ER. We called her back 3 times and no vm is set up to leave a message. We finally were able to speak with patient.  Thank you!

## 2018-07-21 NOTE — Telephone Encounter (Signed)
Pt advised to go to the ER.

## 2018-07-21 NOTE — ED Provider Notes (Signed)
[facility] Emergency Department Provider Note ____________________________________________   First MD Initiated Contact with Patient 07/21/18 2121     (approximate)  I have reviewed the triage vital signs and the nursing notes.   HISTORY  Chief Complaint Hyperglycemia    HPI Hannah [name] is a 63 y.o. female with PMH as noted below who presents with abdominal wall abscess, gradual onset over the last week, and spontaneously draining on its own.  The patient also reports elevated blood sugars over the last few weeks although she states she is compliant with her metformin.  She came to the ED a few days ago but left before being seen because it was too busy.  She had a phone encounter with her PCP today but was told to go to the ER because she may need I&D.  She denies fevers, vomiting or diarrhea, weakness, or shortness of breath.  She states that she had an area that she thought might be an abscess next to one of her teeth but this has subsequently resolved.  Past Medical History:  Diagnosis Date  . Arthritis   . Diabetes mellitus without complication ([place])   . Diet controlled gestational diabetes mellitus in puerperium   . Emphysema of lung ([place])   . 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 GERD (gastroesophageal reflux disease)     Patient Active Problem List   Diagnosis Date Noted  . 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 Special screening for malignant neoplasms, colon   . Benign neoplasm of descending colon   . Other osteoporosis without current pathological fracture 11/25/2015  . Hyperlipidemia associated with type 2 diabetes mellitus ([place]) 03/28/2015  . Dysphagia 11/11/2014  . 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 DM type 2, controlled, with complication ([place]) 17/61/6073  . Chronic obstructive pulmonary emphysema ([place]) 11/11/2014  . Tobacco use disorder, mild, 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  . Degenerative  joint disease involving multiple joints 09/28/2013    Past Surgical History:  Procedure Laterality Date  . COLONOSCOPY WITH PROPOFOL N/A 03/05/2016   Procedure: COLONOSCOPY WITH PROPOFOL;  Surgeon: [name], MD;  Location: [place];  Service: Endoscopy;  Laterality: N/A;  . HIP FRACTURE SURGERY Right 03/2013  . INNER EAR SURGERY Bilateral   . REPLACEMENT TOTAL KNEE Right 02/2015  . SHOULDER SURGERY Bilateral    rotator cuff    Prior to Admission medications   Medication Sig Start Date End Date Taking? Authorizing Provider  albuterol (PROVENTIL HFA;VENTOLIN HFA) 108 (90 Base) MCG/ACT inhaler Inhale 2 puffs into the lungs every 6 (six) hours as needed for wheezing or shortness of breath. 11/19/17   Glean Hess, MD  atorvastatin (LIPITOR) 20 MG tablet Take 1 tablet (20 mg total) by mouth at bedtime. 04/29/18   Glean Hess, MD  Blood Glucose Monitoring Suppl [facility] VERIO) w/Device KIT 1 each by Does not apply route 2 (two) times daily. Test BS twice daily. 01/10/18   Glean Hess, MD  clindamycin (CLEOCIN) 300 MG capsule Take 1 capsule (300 mg total) by mouth 3 (three) times daily for 7 days. 07/21/18 07/28/18  Arta Silence, MD  diclofenac sodium (VOLTAREN) 1 % GEL APP 4 GRAMS EXT AA QID UTD 12/05/17   [provider]  fluconazole (DIFLUCAN) 150 MG tablet Take 1 tablet (150 mg total) by mouth once for 1 dose. 07/21/18 07/21/18  Arta Silence, MD  Fluticasone-Salmeterol (ADVAIR DISKUS) 250-50 MCG/DOSE AEPB Inhale 1 puff into the lungs 2 (two)  times daily. 11/19/17   Glean Hess, MD  gabapentin (NEURONTIN) 600 MG tablet Take 1 tablet (600 mg total) by mouth 3 (three) times daily. 08/25/15   Glean Hess, MD  glucose blood test strip Test BS twice daily. 07/21/18   Glean Hess, MD  metFORMIN (GLUCOPHAGE-XR) 500 MG 24 hr tablet Take 3 tablets (1,500 mg total) by mouth daily. 2 in the AM and 1 in PM 04/29/18   Glean Hess, MD   oxyCODONE-acetaminophen (PERCOCET) 10-325 MG tablet TK 1 T PO Q 4 HOURS PRN FOR PAIN. MAX OF 4 TS PER DAY 12/05/17   [provider]  pramipexole (MIRAPEX) 0.125 MG tablet Mirapex 0.125 mg tablet  1 to 2 in pm for RLS    [provider]  tiZANidine (ZANAFLEX) 4 MG tablet Take 4 mg by mouth every 6 (six) hours as needed.  10/01/12   [provider]    Allergies Levofloxacin  Family History  Problem Relation Age of Onset  . Cervical cancer Mother   . Colon cancer Mother   . Diabetes Father   . Heart disease Father   . Breast cancer Sister 2  . Breast cancer Cousin 40       pat cousin  . Diabetes Sister   . Diabetes Sister   . Congestive Heart Failure Paternal Grandmother   . Diabetes Paternal Grandmother     Social History Social History   Tobacco Use  . Smoking status: Former Smoker    Packs/day: 0.50    Years: 20.00    Pack years: 10.00    Types: Cigarettes    Last attempt to quit: 03/05/2016    Years since quitting: 2.3  . Smokeless tobacco: Never Used  Substance Use Topics  . Alcohol use: No  . Drug use: No    Review of Systems  Constitutional: No fever. Eyes: No redness. ENT: No sore throat. Cardiovascular: Denies chest pain. Respiratory: Denies shortness of breath. Gastrointestinal: No vomiting or diarrhea.  Genitourinary: Negative for dysuria.  Musculoskeletal: Negative for back pain. Skin: Negative for rash.  Positive for abscess. Neurological: Negative for headache.   ____________________________________________   PHYSICAL EXAM:  VITAL SIGNS: ED Triage Vitals  Enc Vitals Group     BP 07/21/18 1912 132/82     Pulse Rate 07/21/18 1912 79     Resp 07/21/18 1912 18     Temp 07/21/18 1912 99.9 F (37.7 C)     Temp Source 07/21/18 1912 Oral     SpO2 07/21/18 1912 95 %     Weight 07/21/18 1913 150 lb (68 kg)     Height 07/21/18 1913 '5\' 4"'  (1.626 m)     Head Circumference --      Peak Flow --      Pain Score 07/21/18  1914 6     Pain Loc --      Pain Edu? --      Excl. in [place]? --     Constitutional: Alert and oriented.  Comfortable appearing and in no acute distress. Eyes: Conjunctivae are normal.  Head: Atraumatic. Nose: No congestion/rhinnorhea. Mouth/Throat: Mucous membranes are moist.   Neck: Normal range of motion.  Cardiovascular: Good peripheral circulation. Respiratory: Normal respiratory effort.  Gastrointestinal: Soft and nontender. No distention.  Right lower abdomen inferior to pannus with 1 cm draining pustule and surrounding approximately 5 cm area of erythema and mild induration. Musculoskeletal: No lower extremity edema.  Extremities warm and well perfused.  Neurologic:  Normal speech and language. No gross focal neurologic deficits are appreciated.  Skin:  Skin is warm and dry.  Psychiatric: Mood and affect are normal. Speech and behavior are normal.  ____________________________________________   LABS (all labs ordered are listed, but only abnormal results are displayed)  Labs Reviewed  CBC WITH DIFFERENTIAL/PLATELET - Abnormal; Notable for the following components:      Result Value   WBC 11.7 (*)    Lymphs Abs 4.3 (*)    All other components within normal limits  COMPREHENSIVE METABOLIC PANEL - Abnormal; Notable for the following components:   Glucose, Bld 181 (*)    BUN 7 (*)    All other components within normal limits  GLUCOSE, CAPILLARY - Abnormal; Notable for the following components:   Glucose-Capillary 174 (*)    All other components within normal limits  URINALYSIS, COMPLETE (UACMP) WITH MICROSCOPIC  CBG MONITORING, ED   ____________________________________________  EKG   ____________________________________________  RADIOLOGY    ____________________________________________   PROCEDURES  Procedure(s) performed: No  Procedures  Critical Care performed: No ____________________________________________   INITIAL IMPRESSION / ASSESSMENT AND  PLAN / ED COURSE  Pertinent labs & imaging results that were available during my care of the patient were reviewed by me and considered in my medical decision making (see chart for details).  63 year old female with history of diabetes on metformin presents for evaluation of an abscess to her right lower abdomen in the suprapubic region.  The patient states that she initially came to the ED few days ago but it was too busy and the wait was too long, so she left.  Subsequently the abscess started draining on its own however she states that her own doctor would not see her because the doctor was concerned that the abscess may still need I&D.  The patient reports elevated blood sugar levels over the last few days although this has improved today.  She denies fever or systemic symptoms.  On exam she is well-appearing.  Her vital signs are normal except for minimally elevated temperature.  Exam reveals evidence of an abscess to the right suprapubic area which is draining.  I do not feel any remaining fluctuance.  I evaluated the area with a bedside ultrasound and confirmed that there is no remaining fluid collection.  There is no indication for further I&D.  Given that the patient does have some surrounding erythema and possible cellulitis, I will prescribe a course of oral antibiotic.  Lab work-up is unremarkable except for minimally elevated WBC count.  The glucose is under 200 and there is no anion gap or evidence of DKA.  The patient is stable for discharge home.  Return precautions given, and she expresses understanding.   ____________________________________________   FINAL CLINICAL IMPRESSION(S) / ED DIAGNOSES  Final diagnoses:  Abdominal wall abscess      NEW MEDICATIONS STARTED DURING THIS VISIT:  Discharge Medication List as of 07/21/2018 10:09 PM    START taking these medications   Details  clindamycin (CLEOCIN) 300 MG capsule Take 1 capsule (300 mg total) by mouth 3 (three) times  daily for 7 days., Starting Mon 07/21/2018, Until Mon 07/28/2018, Normal    fluconazole (DIFLUCAN) 150 MG tablet Take 1 tablet (150 mg total) by mouth once for 1 dose., Starting Mon 07/21/2018, Normal         Note:  This document was prepared using Dragon voice recognition software and may include unintentional dictation errors.    Arta Silence, MD 07/21/18 2332

## 2018-07-21 NOTE — Telephone Encounter (Signed)
Patient called saying her sugars are spiking. Said she has a new infection ( but did not say what this was ) and her BS is shooting in 400s + .   Tried calling patient back to get more information and her VM is not set up so could not leave message.  Awaiting patient cb to discuss and schedule an appt.

## 2018-07-21 NOTE — Telephone Encounter (Signed)
Called patient due to lwot to inquire about condition and follow up plans. Says she has a call in to pcp and awaiting call back.  Says sugar continues to run high and has 2 abscesses lower abdomen that she is unable to see, but they are draining and malodorous "like rotten eggs."  She also says she is getting an abscess in jaw.  She said she was afraid to return here due to covid.  I told her it is safe to come here.  I also told her that she can go to [facility] if needed, but that only if she were very sick, they would send her here.

## 2018-07-21 NOTE — Discharge Instructions (Addendum)
Take the antibiotic as prescribed and finish the full 7-day course.  You can take the Diflucan if needed for yeast symptoms.  Continue to take your metformin as prescribed.  Return to the ER for new or worsening swelling, rash, pus drainage, fever, persistently high sugars, or any other new or worsening symptoms that concern you.

## 2018-07-21 NOTE — Telephone Encounter (Signed)
Patient called saying she has 2 boils on the line of her stomach. She also has a very painful abscess in her mouth.   Informed her since its the end of the day we recommend her to go back to ER. Patient described that they look infected and are also causing her BS to spike in the 400s.  She said she will go back to ER. Needed RF for test strips and sent those in as discussed.

## 2018-07-21 NOTE — ED Triage Notes (Addendum)
Patient ambulatory to triage with steady gait, without difficulty or distress noted; st here Saturday for infection but left prior to being seen due to long wait; st was told by PCP via phone visit to return today for possible I&D and antibiotic; st FSBS 400 at home with excessive thirst; reports left upper dental pain and small reddened hair follicle to right lower pubic area

## 2018-07-22 DIAGNOSIS — R69 Illness, unspecified: Secondary | ICD-10-CM | POA: Diagnosis not present

## 2018-08-20 ENCOUNTER — Other Ambulatory Visit: Payer: Self-pay | Admitting: Internal Medicine

## 2018-08-20 DIAGNOSIS — J439 Emphysema, unspecified: Secondary | ICD-10-CM

## 2018-08-27 ENCOUNTER — Encounter: Payer: Self-pay | Admitting: Internal Medicine

## 2018-08-27 ENCOUNTER — Ambulatory Visit (INDEPENDENT_AMBULATORY_CARE_PROVIDER_SITE_OTHER): Payer: Medicare HMO | Admitting: Internal Medicine

## 2018-08-27 ENCOUNTER — Other Ambulatory Visit: Payer: Self-pay

## 2018-08-27 VITALS — BP 116/95 | HR 82 | Temp 98.9°F | Ht 64.0 in | Wt 150.0 lb

## 2018-08-27 DIAGNOSIS — J439 Emphysema, unspecified: Secondary | ICD-10-CM | POA: Diagnosis not present

## 2018-08-27 DIAGNOSIS — Z20822 Contact with and (suspected) exposure to covid-19: Secondary | ICD-10-CM

## 2018-08-27 DIAGNOSIS — K219 Gastro-esophageal reflux disease without esophagitis: Secondary | ICD-10-CM | POA: Diagnosis not present

## 2018-08-27 DIAGNOSIS — R6889 Other general symptoms and signs: Secondary | ICD-10-CM

## 2018-08-27 MED ORDER — DOXYCYCLINE HYCLATE 100 MG PO TABS: 100.0000 mg | ORAL_TABLET | Freq: Two times a day (BID) | ORAL | 0 refills | Status: AC

## 2018-08-27 MED ORDER — OMEPRAZOLE MAGNESIUM 20 MG PO TBEC: 20.0000 mg | DELAYED_RELEASE_TABLET | Freq: Every day | ORAL | 5 refills | Status: DC

## 2018-08-27 NOTE — Progress Notes (Signed)
Date:  08/27/2018   Name:  Hannah [name]   DOB:  [birth year]   MRN:  [id number]  I connected with this patient, Hannah [name], by telephone at the patient's home.  I verified that I am speaking with the correct person using two identifiers. This visit was conducted via telephone due to the Covid-19 outbreak from my office at [facility] in [place], [place]. I discussed the limitations, risks, security and privacy concerns of performing an evaluation and management service by telephone. I also discussed with the patient that there may be a patient responsible charge related to this service. The patient expressed understanding and agreed to proceed.  Chief Complaint: Cough (Cough X1 week with clear mucous. Diarrhea- using pepto with no relief. Sneezing a lot. Joint stiffness worse than usual. ) and Gastroesophageal Reflux (Patient is taking omeprazole 20 mg daily. She said her body does not disolve capsules, wants to know if we can send in tablets for her to take instead.)  Cough This is a new problem. The current episode started in the past 7 days (2 weeks ago visited fathers old house and collected rent from one of the tennants.). The problem occurs every few minutes. The cough is productive of sputum. Associated symptoms include heartburn, myalgias and shortness of breath (mildly). Pertinent negatives include no chest pain, chills, fever, headaches, sore throat or wheezing. She has tried a beta-agonist inhaler (using inhalers more often) for the symptoms. Her past medical history is significant for COPD. There is no history of environmental allergies.  Gastroesophageal Reflux She complains of coughing and heartburn. She reports no chest pain, no sore throat or no wheezing. The problem occurs occasionally. She has tried a PPI (concerned that she does not digest the capsules) for the symptoms.    Review of Systems  Constitutional: Negative for chills and fever.  HENT: Negative for sore throat  and trouble swallowing.        Dry mouth - and less taste for food  Eyes: Negative for visual disturbance.  Respiratory: Positive for cough and shortness of breath (mildly). Negative for wheezing.   Cardiovascular: Negative for chest pain and palpitations.  Gastrointestinal: Positive for diarrhea (3-4 times per day) and heartburn. Negative for vomiting.  Musculoskeletal: Positive for myalgias.  Allergic/Immunologic: Negative for environmental allergies.  Neurological: Negative for dizziness, light-headedness and headaches.    Patient Active Problem List   Diagnosis Date Noted  . 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 Special screening for malignant neoplasms, colon   . Benign neoplasm of descending colon   . Other osteoporosis without current pathological fracture 11/25/2015  . Hyperlipidemia associated with type 2 diabetes mellitus ([place]) 03/28/2015  . Dysphagia 11/11/2014  . 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 DM type 2, controlled, with complication ([place]) 84/16/6063  . Tobacco use disorder, mild, 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  . Degenerative joint disease involving multiple joints 09/28/2013    Allergies  Allergen Reactions  . Levofloxacin Nausea And Vomiting    Past Surgical History:  Procedure Laterality Date  . COLONOSCOPY WITH PROPOFOL N/A 03/05/2016   Procedure: COLONOSCOPY WITH PROPOFOL;  Surgeon: [name], MD;  Location: [place];  Service: Endoscopy;  Laterality: N/A;  . HIP FRACTURE SURGERY Right 03/2013  . INNER EAR SURGERY Bilateral   . REPLACEMENT TOTAL KNEE Right 02/2015  . SHOULDER SURGERY Bilateral    rotator cuff    Social History   Tobacco Use  . Smoking status: Former Smoker  Packs/day: 0.50    Years: 20.00    Pack years: 10.00    Types: Cigarettes    Quit date: 03/05/2016    Years since quitting: 2.4  . Smokeless tobacco: Never Used  Substance Use Topics  . Alcohol use: No  . Drug use: No      Medication list has been reviewed and updated.  Current Meds  Medication Sig  . albuterol (VENTOLIN HFA) 108 (90 Base) MCG/ACT inhaler INHALE 2 PUFFS BY MOUTH INTO THE LUNGS EVERY 6 HOURS AS NEEDED FOR WHEEZING OR SHORTNESS OF BREATH  . atorvastatin (LIPITOR) 20 MG tablet Take 1 tablet (20 mg total) by mouth at bedtime.  . Blood Glucose Monitoring Suppl (ONETOUCH VERIO) w/Device KIT 1 each by Does not apply route 2 (two) times daily. Test BS twice daily.  . diclofenac sodium (VOLTAREN) 1 % GEL APP 4 GRAMS EXT AA QID UTD  . Fluticasone-Salmeterol (ADVAIR DISKUS) 250-50 MCG/DOSE AEPB Inhale 1 puff into the lungs 2 (two) times daily.  Marland Kitchen gabapentin (NEURONTIN) 600 MG tablet Take 1 tablet (600 mg total) by mouth 3 (three) times daily.  Marland Kitchen glucose blood test strip Test BS twice daily.  . metFORMIN (GLUCOPHAGE-XR) 500 MG 24 hr tablet Take 3 tablets (1,500 mg total) by mouth daily. 2 in the AM and 1 in PM  . oxyCODONE-acetaminophen (PERCOCET) 10-325 MG tablet TK 1 T PO Q 4 HOURS PRN FOR PAIN. MAX OF 4 TS PER DAY  . pramipexole (MIRAPEX) 0.125 MG tablet Mirapex 0.125 mg tablet  1 to 2 in pm for RLS  . tiZANidine (ZANAFLEX) 4 MG tablet Take 4 mg by mouth every 6 (six) hours as needed.   . [DISCONTINUED] omeprazole (PRILOSEC) 20 MG capsule Take 20 mg by mouth daily.    PHQ 2/9 Scores 04/25/2018 12/25/2017 12/24/2016 10/04/2016  PHQ - 2 Score 0 6 0 1  PHQ- 9 Score 2 10 - -    BP Readings from Last 3 Encounters:  08/27/18 (!) 116/95  07/21/18 140/74  07/19/18 113/71    Physical Exam Pulmonary:     Comments: Occasional harsh cough during the call No voice changes or hoarseness Neurological:     Mental Status: She is alert.  Psychiatric:        Attention and Perception: Attention normal.        Mood and Affect: Mood normal.     Wt Readings from Last 3 Encounters:  08/27/18 150 lb (68 kg)  07/21/18 150 lb (68 kg)  07/19/18 150 lb (68 kg)    BP (!) 116/95   Pulse 82   Temp 98.9 F  (37.2 C) (Oral)   Ht '5\' 4"'  (1.626 m)   Wt 150 lb (68 kg)   BMI 25.75 kg/m   Assessment and Plan: 1. Suspected Covid-19 Virus Infection Refer for testing Advise isolation until results return  2. Gastroesophageal reflux disease, esophagitis presence not specified Change omeprazole to tablet form for improved absorption - omeprazole (PRILOSEC OTC) 20 MG tablet; Take 1 tablet (20 mg total) by mouth daily.  Dispense: 30 tablet; Refill: 5  3. Pulmonary emphysema, unspecified emphysema type ([place]) Suspect mild exacerbation +/- Covid-19 - doxycycline (VIBRA-TABS) 100 MG tablet; Take 1 tablet (100 mg total) by mouth 2 (two) times daily for 10 days.  Dispense: 20 tablet; Refill: 0  I spent 16 minutes on this encounter. Partially dictated using Editor, commissioning. Any errors are unintentional.  Halina Maidens, MD [facility]  08/27/2018

## 2018-08-28 ENCOUNTER — Other Ambulatory Visit

## 2018-08-28 ENCOUNTER — Telehealth: Payer: Self-pay | Admitting: *Deleted

## 2018-08-28 DIAGNOSIS — Z20822 Contact with and (suspected) exposure to covid-19: Secondary | ICD-10-CM

## 2018-08-28 DIAGNOSIS — R6889 Other general symptoms and signs: Secondary | ICD-10-CM | POA: Diagnosis not present

## 2018-08-28 NOTE — Telephone Encounter (Signed)
-----   Message from Glean Hess, MD sent at 08/27/2018 11:37 AM EDT ----- Please test for Covid-19

## 2018-08-28 NOTE — Telephone Encounter (Signed)
Spoke with patient, scheduled her for COVID 19 test today at [facility] at 11:30 am.  Testing protocol reviewed.

## 2018-09-02 LAB — NOVEL CORONAVIRUS, NAA: SARS-CoV-2, NAA: NOT DETECTED

## 2018-09-10 ENCOUNTER — Telehealth: Payer: Self-pay | Admitting: Internal Medicine

## 2018-09-10 NOTE — Telephone Encounter (Signed)
General/Other - results  The patient was given negative covid-19 results

## 2018-10-03 DIAGNOSIS — M25569 Pain in unspecified knee: Secondary | ICD-10-CM | POA: Diagnosis not present

## 2018-10-03 DIAGNOSIS — G894 Chronic pain syndrome: Secondary | ICD-10-CM | POA: Diagnosis not present

## 2018-10-03 DIAGNOSIS — M5416 Radiculopathy, lumbar region: Secondary | ICD-10-CM | POA: Diagnosis not present

## 2018-10-03 DIAGNOSIS — G2581 Restless legs syndrome: Secondary | ICD-10-CM | POA: Diagnosis not present

## 2018-10-03 DIAGNOSIS — M25559 Pain in unspecified hip: Secondary | ICD-10-CM | POA: Diagnosis not present

## 2018-10-03 DIAGNOSIS — M6530 Trigger finger, unspecified finger: Secondary | ICD-10-CM | POA: Diagnosis not present

## 2018-10-03 DIAGNOSIS — Z79899 Other long term (current) drug therapy: Secondary | ICD-10-CM | POA: Diagnosis not present

## 2018-10-03 DIAGNOSIS — M25552 Pain in left hip: Secondary | ICD-10-CM | POA: Diagnosis not present

## 2018-10-03 DIAGNOSIS — M25551 Pain in right hip: Secondary | ICD-10-CM | POA: Diagnosis not present

## 2018-10-03 DIAGNOSIS — M47816 Spondylosis without myelopathy or radiculopathy, lumbar region: Secondary | ICD-10-CM | POA: Diagnosis not present

## 2018-10-03 DIAGNOSIS — Z5181 Encounter for therapeutic drug level monitoring: Secondary | ICD-10-CM | POA: Diagnosis not present

## 2018-10-03 DIAGNOSIS — M19049 Primary osteoarthritis, unspecified hand: Secondary | ICD-10-CM | POA: Diagnosis not present

## 2018-10-29 ENCOUNTER — Ambulatory Visit (INDEPENDENT_AMBULATORY_CARE_PROVIDER_SITE_OTHER): Payer: Medicare HMO | Admitting: Internal Medicine

## 2018-10-29 ENCOUNTER — Encounter: Payer: Self-pay | Admitting: Internal Medicine

## 2018-10-29 ENCOUNTER — Other Ambulatory Visit: Payer: Self-pay

## 2018-10-29 ENCOUNTER — Telehealth: Payer: Self-pay

## 2018-10-29 VITALS — BP 124/68 | HR 64 | Ht 64.0 in | Wt 152.0 lb

## 2018-10-29 DIAGNOSIS — J3089 Other allergic rhinitis: Secondary | ICD-10-CM

## 2018-10-29 DIAGNOSIS — E1169 Type 2 diabetes mellitus with other specified complication: Secondary | ICD-10-CM | POA: Diagnosis not present

## 2018-10-29 DIAGNOSIS — F39 Unspecified mood [affective] disorder: Secondary | ICD-10-CM

## 2018-10-29 DIAGNOSIS — E785 Hyperlipidemia, unspecified: Secondary | ICD-10-CM

## 2018-10-29 DIAGNOSIS — Z23 Encounter for immunization: Secondary | ICD-10-CM | POA: Diagnosis not present

## 2018-10-29 DIAGNOSIS — E118 Type 2 diabetes mellitus with unspecified complications: Secondary | ICD-10-CM | POA: Diagnosis not present

## 2018-10-29 DIAGNOSIS — R69 Illness, unspecified: Secondary | ICD-10-CM | POA: Diagnosis not present

## 2018-10-29 MED ORDER — METFORMIN HCL ER 500 MG PO TB24: 500.0000 mg | ORAL_TABLET | Freq: Two times a day (BID) | ORAL | 5 refills | Status: DC

## 2018-10-29 MED ORDER — FLUTICASONE PROPIONATE 50 MCG/ACT NA SUSP: 2.0000 | Freq: Every day | NASAL | 6 refills | Status: DC

## 2018-10-29 MED ORDER — ATORVASTATIN CALCIUM 20 MG PO TABS: 20.0000 mg | ORAL_TABLET | Freq: Every day | ORAL | 5 refills | Status: DC

## 2018-10-29 NOTE — Telephone Encounter (Signed)
Patient came in to office for visit today. Said she is supposed to be received omeprazole TABS because she cannot dissolve capsules and the pharmacy is still giving her CAPS.  Contacted the pharmacy and spoke with "Kiko". He informed me that the computer automatically changes prilosec to capsules to be covered by insurance. Otherwise, the patient will need to buy over the counter OR we can send in the dissolvable RX for omeprazole. He said this may be more suitable for the pt since she has trouble absorbing meds.  Please advise.  [facility] in [place].

## 2018-10-29 NOTE — Telephone Encounter (Signed)
Patient informed by [place] and pharmacy. Bought tablets over the counter.

## 2018-10-29 NOTE — Telephone Encounter (Signed)
Insurance will not cover the tablets of omeprazole.  She can get over the counter oral disintegrating tablets but I can not find that as a prescription.

## 2018-10-29 NOTE — Patient Instructions (Addendum)
Please schedule diabetic eye exam.  Reduce metformin to 2 per day.

## 2018-10-29 NOTE — Progress Notes (Signed)
Date:  10/29/2018   Name:  Hannah [name]   DOB:  [birth year]   MRN:  [id number]   Chief Complaint: Diabetes (Flu shot.) and Depression (11-PHQ9. Having diarrhea so does not feel like eating much. )  Diabetes She presents for her follow-up diabetic visit. She has type 2 diabetes mellitus. Pertinent negatives for hypoglycemia include no dizziness, headaches or tremors. Pertinent negatives for diabetes include no chest pain, no fatigue and no weakness. Symptoms are improving. Current diabetic treatment includes oral agent (monotherapy) (metformin dose increased last visit to 1500 mg per day but pt is having diarrhea). She is compliant with treatment most of the time. She monitors blood glucose at home 3-4 x per week. Her breakfast blood glucose is taken between 7-8 am. Her breakfast blood glucose range is generally 90-110 mg/dl. An ACE inhibitor/angiotensin II receptor blocker is not being taken. Eye exam is not current.  Hyperlipidemia This is a chronic problem. The problem is uncontrolled. Associated symptoms include shortness of breath. Pertinent negatives include no chest pain. Current antihyperlipidemic treatment includes statins (dose increased last visit).  Depression        This is a new problem.  The onset quality is gradual.   Associated symptoms include decreased concentration and helplessness.  Associated symptoms include no fatigue, no restlessness, no decreased interest, no headaches and no suicidal ideas.     The symptoms are aggravated by social issues and work stress.  Past treatments include nothing.  Lab Results  Component Value Date   HGBA1C 9.5 (H) 04/25/2018   Lab Results  Component Value Date   CREATININE 0.64 07/21/2018   BUN 7 (L) 07/21/2018   NA 137 07/21/2018   K 3.8 07/21/2018   CL 104 07/21/2018   CO2 24 07/21/2018   Lab Results  Component Value Date   CHOL 229 (H) 04/25/2018   HDL 55 04/25/2018   LDLCALC 150 (H) 04/25/2018   TRIG 119 04/25/2018   CHOLHDL  4.2 04/25/2018     Review of Systems  Constitutional: Negative for fatigue and fever.  HENT: Positive for congestion and sinus pressure. Negative for hearing loss, nosebleeds, sinus pain, sore throat, trouble swallowing and voice change.   Respiratory: Positive for cough and shortness of breath. Negative for wheezing.   Cardiovascular: Negative for chest pain, palpitations and leg swelling.  Gastrointestinal: Positive for diarrhea. Negative for abdominal pain, blood in stool and constipation.  Genitourinary: Negative for dysuria.  Musculoskeletal: Positive for arthralgias, back pain and gait problem.  Skin: Negative for color change and rash.  Neurological: Negative for dizziness, tremors, weakness and headaches.  Psychiatric/Behavioral: Positive for decreased concentration, depression, dysphoric mood and sleep disturbance. Negative for hallucinations and suicidal ideas.    Patient Active Problem List   Diagnosis Date Noted  . 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 Special screening for malignant neoplasms, colon   . Benign neoplasm of descending colon   . Other osteoporosis without current pathological fracture 11/25/2015  . Hyperlipidemia associated with type 2 diabetes mellitus ([place]) 03/28/2015  . Dysphagia 11/11/2014  . 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 Type II diabetes mellitus with complication ([place]) 93/57/0177  . 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 Tobacco use disorder, mild, 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  . Degenerative joint disease involving multiple joints 09/28/2013    Allergies  Allergen Reactions  . Levofloxacin Nausea And Vomiting    Past Surgical History:  Procedure Laterality Date  . COLONOSCOPY WITH  PROPOFOL N/A 03/05/2016   Procedure: COLONOSCOPY WITH PROPOFOL;  Surgeon: [name], MD;  Location: [facility];  Service: Endoscopy;  Laterality: N/A;  . HIP FRACTURE  SURGERY Right 03/2013  . INNER EAR SURGERY Bilateral   . REPLACEMENT TOTAL KNEE Right 02/2015  . SHOULDER SURGERY Bilateral    rotator cuff    Social History   Tobacco Use  . Smoking status: Former Smoker    Packs/day: 0.50    Years: 20.00    Pack years: 10.00    Types: Cigarettes    Quit date: 03/05/2016    Years since quitting: 2.6  . Smokeless tobacco: Never Used  Substance Use Topics  . Alcohol use: No  . Drug use: No     Medication list has been reviewed and updated.  Current Meds  Medication Sig  . albuterol (VENTOLIN HFA) 108 (90 Base) MCG/ACT inhaler INHALE 2 PUFFS BY MOUTH INTO THE LUNGS EVERY 6 HOURS AS NEEDED FOR WHEEZING OR SHORTNESS OF BREATH  . atorvastatin (LIPITOR) 20 MG tablet Take 1 tablet (20 mg total) by mouth at bedtime.  . Blood Glucose Monitoring Suppl (ONETOUCH VERIO) w/Device KIT 1 each by Does not apply route 2 (two) times daily. Test BS twice daily.  . diclofenac sodium (VOLTAREN) 1 % GEL APP 4 GRAMS EXT AA QID UTD  . Fluticasone-Salmeterol (ADVAIR DISKUS) 250-50 MCG/DOSE AEPB Inhale 1 puff into the lungs 2 (two) times daily.  Marland Kitchen gabapentin (NEURONTIN) 600 MG tablet Take 1 tablet (600 mg total) by mouth 3 (three) times daily.  Marland Kitchen glucose blood test strip Test BS twice daily.  . metFORMIN (GLUCOPHAGE-XR) 500 MG 24 hr tablet Take 3 tablets (1,500 mg total) by mouth daily. 2 in the AM and 1 in PM  . omeprazole (PRILOSEC OTC) 20 MG tablet Take 1 tablet (20 mg total) by mouth daily.  Marland Kitchen oxyCODONE-acetaminophen (PERCOCET) 10-325 MG tablet TK 1 T PO Q 4 HOURS PRN FOR PAIN. MAX OF 4 TS PER DAY  . pramipexole (MIRAPEX) 0.125 MG tablet Mirapex 0.125 mg tablet  1 to 2 in pm for RLS  . tiZANidine (ZANAFLEX) 4 MG tablet Take 4 mg by mouth every 6 (six) hours as needed.     PHQ 2/9 Scores 10/29/2018 04/25/2018 12/25/2017 12/24/2016  PHQ - 2 Score 1 0 6 0  PHQ- 9 Score _0 -    BP Readings from Last 3 Encounters:  10/29/18 124/68  08/27/18 (!) 116/95  07/21/18  140/74    Physical Exam Vitals signs and nursing note reviewed.  Constitutional:      General: She is not in acute distress.    Appearance: Normal appearance. She is well-developed.  HENT:     Head: Normocephalic and atraumatic.  Neck:     Musculoskeletal: Normal range of motion.  Cardiovascular:     Rate and Rhythm: Normal rate and regular rhythm.     Pulses: Normal pulses.     Heart sounds: No murmur.  Pulmonary:     Effort: Pulmonary effort is normal. No respiratory distress.     Breath sounds: Decreased air movement present. Decreased breath sounds present. No rhonchi or rales.  Musculoskeletal: Normal range of motion.  Lymphadenopathy:     Cervical: No cervical adenopathy.  Skin:    General: Skin is warm and dry.     Findings: No rash.  Neurological:     Mental Status: She is alert and oriented to person, place, and time.     Gait:  Gait abnormal (uses walker).  Psychiatric:        Attention and Perception: Attention normal.        Mood and Affect: Mood is depressed.        Behavior: Behavior normal.        Thought Content: Thought content normal.     Wt Readings from Last 3 Encounters:  10/29/18 152 lb (68.9 kg)  08/27/18 150 lb (68 kg)  07/21/18 150 lb (68 kg)    BP 124/68   Pulse 64   Ht _0  (1.626 m)   Wt 152 lb (68.9 kg)   SpO2 98%   BMI 26.09 kg/m   Assessment and Plan: 1. Type II diabetes mellitus with complication (HCC) Last V8A was very high but she has not tolerated higher metformin dose due to diarrhea Will reduce dose to 2 metformin per day and likely need to add another medication once lab returned Pt will call if diarrhea is not improved - Comprehensive metabolic panel - Hemoglobin A1c - metFORMIN (GLUCOPHAGE-XR) 500 MG 24 hr tablet; Take 1 tablet (500 mg total) by mouth 2 (two) times daily. 2 in the AM and 1 in PM  Dispense: 60 tablet; Refill: 5  2. Hyperlipidemia associated with type 2 diabetes mellitus ([place]) LDL cholesterol was not at  goal last visit Lipitor increased to 20 gm per day - she has no side effects/myalgia related to the medication - Comprehensive metabolic panel - Lipid panel - atorvastatin (LIPITOR) 20 MG tablet; Take 1 tablet (20 mg total) by mouth at bedtime.  Dispense: 30 tablet; Refill: 5  4. Environmental and seasonal allergies No s/s of infection at this time Will start flonase daily - call if sx worsen - fluticasone (FLONASE) 50 MCG/ACT nasal spray; Place 2 sprays into both nostrils daily.  Dispense: 16 g; Refill: 6  5. Need for immunization against influenza - Flu Vaccine QUAD 36+ mos IM  5. Mood disorder ([place]) Due to current issues with diarrhea, poor appetite, social stresses She is clinically stable without suicidal thought or intent She is not interested in medication at this time but will follow up if sx worsen  Patient is reminded to schedule Mammogram.  Partially dictated using Editor, commissioning. Any errors are unintentional.  Halina Maidens, MD [facility]  10/29/2018

## 2018-10-30 ENCOUNTER — Other Ambulatory Visit: Payer: Self-pay | Admitting: Internal Medicine

## 2018-10-30 DIAGNOSIS — E118 Type 2 diabetes mellitus with unspecified complications: Secondary | ICD-10-CM

## 2018-10-30 LAB — LIPID PANEL
Chol/HDL Ratio: 3.5 ratio (ref 0.0–4.4)
Cholesterol, Total: 223 mg/dL — ABNORMAL HIGH (ref 100–199)
HDL: 64 mg/dL (ref >39)
LDL Chol Calc (NIH): 136 mg/dL — ABNORMAL HIGH (ref 0–99)
Triglycerides: 132 mg/dL (ref 0–149)
VLDL Cholesterol Cal: 23 mg/dL (ref 5–40)

## 2018-10-30 LAB — COMPREHENSIVE METABOLIC PANEL
ALT: 27 IU/L (ref 0–32)
AST: 14 IU/L (ref 0–40)
Albumin/Globulin Ratio: 1.9 (ref 1.2–2.2)
Albumin: 4.4 g/dL (ref 3.8–4.8)
Alkaline Phosphatase: 93 IU/L (ref 39–117)
BUN/Creatinine Ratio: 9 — ABNORMAL LOW (ref 12–28)
BUN: 6 mg/dL — ABNORMAL LOW (ref 8–27)
Bilirubin Total: 0.3 mg/dL (ref 0.0–1.2)
CO2: 25 mmol/L (ref 20–29)
Calcium: 9.6 mg/dL (ref 8.7–10.3)
Chloride: 101 mmol/L (ref 96–106)
Creatinine, Ser: 0.65 mg/dL (ref 0.57–1.00)
GFR calc Af Amer: 109 mL/min/{1.73_m2} (ref >59)
GFR calc non Af Amer: 95 mL/min/{1.73_m2} (ref >59)
Globulin, Total: 2.3 g/dL (ref 1.5–4.5)
Glucose: 178 mg/dL — ABNORMAL HIGH (ref 65–99)
Potassium: 4.6 mmol/L (ref 3.5–5.2)
Sodium: 139 mmol/L (ref 134–144)
Total Protein: 6.7 g/dL (ref 6.0–8.5)

## 2018-10-30 LAB — HEMOGLOBIN A1C
Est. average glucose Bld gHb Est-mCnc: 206 mg/dL
Hgb A1c MFr Bld: 8.8 % — ABNORMAL HIGH (ref 4.8–5.6)

## 2018-10-30 MED ORDER — SITAGLIPTIN PHOSPHATE 100 MG PO TABS: 100.0000 mg | ORAL_TABLET | Freq: Every day | ORAL | 5 refills | Status: DC

## 2018-11-12 DIAGNOSIS — Z0100 Encounter for examination of eyes and vision without abnormal findings: Secondary | ICD-10-CM | POA: Diagnosis not present

## 2018-11-12 DIAGNOSIS — E119 Type 2 diabetes mellitus without complications: Secondary | ICD-10-CM | POA: Diagnosis not present

## 2018-11-13 ENCOUNTER — Telehealth: Payer: Self-pay

## 2018-11-13 NOTE — Telephone Encounter (Signed)
Pt called saying she is having issue with tooth pain / abscess pain. She said you have treated her with abx before to help.   Told her you are out of the office until Monday. I recommended her finding a dentist to get treatment. She said " its not that bad yet." I then told she may be able to see urgent care and discuss this since she does not want to see a dentist at this time. She said " I'm trying to stay out of places like that."   She said she will try and treat this at home. If it becomes urgent she will then see an urgent care, but otherwise she claims she may call Monday to see if you will give her abx for this.

## 2018-11-19 ENCOUNTER — Other Ambulatory Visit: Payer: Self-pay | Admitting: Internal Medicine

## 2018-12-04 DIAGNOSIS — M47816 Spondylosis without myelopathy or radiculopathy, lumbar region: Secondary | ICD-10-CM | POA: Diagnosis not present

## 2018-12-04 DIAGNOSIS — G2581 Restless legs syndrome: Secondary | ICD-10-CM | POA: Diagnosis not present

## 2018-12-04 DIAGNOSIS — M6530 Trigger finger, unspecified finger: Secondary | ICD-10-CM | POA: Diagnosis not present

## 2018-12-04 DIAGNOSIS — R202 Paresthesia of skin: Secondary | ICD-10-CM | POA: Diagnosis not present

## 2018-12-04 DIAGNOSIS — Z79899 Other long term (current) drug therapy: Secondary | ICD-10-CM | POA: Diagnosis not present

## 2018-12-04 DIAGNOSIS — M25569 Pain in unspecified knee: Secondary | ICD-10-CM | POA: Diagnosis not present

## 2018-12-04 DIAGNOSIS — M5416 Radiculopathy, lumbar region: Secondary | ICD-10-CM | POA: Diagnosis not present

## 2018-12-04 DIAGNOSIS — M19049 Primary osteoarthritis, unspecified hand: Secondary | ICD-10-CM | POA: Diagnosis not present

## 2018-12-04 DIAGNOSIS — G894 Chronic pain syndrome: Secondary | ICD-10-CM | POA: Diagnosis not present

## 2018-12-04 DIAGNOSIS — M25559 Pain in unspecified hip: Secondary | ICD-10-CM | POA: Diagnosis not present

## 2018-12-15 DIAGNOSIS — R269 Unspecified abnormalities of gait and mobility: Secondary | ICD-10-CM | POA: Diagnosis not present

## 2018-12-15 DIAGNOSIS — M6281 Muscle weakness (generalized): Secondary | ICD-10-CM | POA: Diagnosis not present

## 2018-12-15 DIAGNOSIS — M47816 Spondylosis without myelopathy or radiculopathy, lumbar region: Secondary | ICD-10-CM | POA: Diagnosis not present

## 2018-12-29 ENCOUNTER — Ambulatory Visit (INDEPENDENT_AMBULATORY_CARE_PROVIDER_SITE_OTHER): Payer: Medicare HMO

## 2018-12-29 VITALS — BP 122/78 | HR 62 | Temp 99.1°F | Ht 64.0 in | Wt 152.0 lb

## 2018-12-29 DIAGNOSIS — Z0000 Encounter for general adult medical examination without abnormal findings: Secondary | ICD-10-CM | POA: Diagnosis not present

## 2018-12-29 NOTE — Progress Notes (Signed)
Subjective:   Hannah [name] is a 63 y.o. female who presents for Medicare Annual (Subsequent) preventive examination.  Virtual Visit via Telephone Note  I connected with MAELY CLEMENTS on 12/29/18 at 10:00 AM EST by telephone and verified that I am speaking with the correct person using two identifiers.  Medicare Annual Wellness visit completed telephonically due to Covid-19 pandemic.   Location: Patient: home Provider: office   I discussed the limitations, risks, security and privacy concerns of performing an evaluation and management service by telephone and the availability of in person appointments. The patient expressed understanding and agreed to proceed.  Some vital signs may be absent or patient reported.   Clemetine Marker, LPN    Review of Systems:   Cardiac Risk Factors include: diabetes mellitus;dyslipidemia;sedentary lifestyle     Objective:     Vitals: BP 122/78   Pulse 62   Temp 99.1 F (37.3 C)   Ht '5\' 4"'  (1.626 m)   Wt 152 lb (68.9 kg)   BMI 26.09 kg/m   Body mass index is 26.09 kg/m.  Advanced Directives 12/29/2018 07/21/2018 07/19/2018 12/25/2017 12/24/2016 03/05/2016 03/28/2015  Does Patient Have a Medical Advance Directive? No No No No No No No  Would patient like information on creating a medical advance directive? Yes (MAU/Ambulatory/Procedural Areas - Information given) No - Patient declined - Yes (MAU/Ambulatory/Procedural Areas - Information given) No - Patient declined No - Patient declined No - patient declined information    Tobacco Social History   Tobacco Use  Smoking Status Former Smoker  . Packs/day: 0.50  . Years: 20.00  . Pack years: 10.00  . Types: Cigarettes  . Quit date: 03/05/2016  . Years since quitting: 2.8  Smokeless Tobacco Never Used     Counseling given: Not Answered   Clinical Intake:  Pre-visit preparation completed: Yes  Pain : 0-10 Pain Score: 5  Pain Type: Chronic pain Pain Location: Shoulder Pain  Orientation: Right, Left Pain Descriptors / Indicators: Aching, Discomfort, Sore Pain Onset: More than a month ago Pain Frequency: Constant     BMI - recorded: 26.09 Nutritional Status: BMI 25 -29 Overweight Nutritional Risks: None Diabetes: Yes CBG done?: No Did pt. bring in CBG monitor from home?: No   Nutrition Risk Assessment:  Has the patient had any N/V/D within the last 2 months?  No  Does the patient have any non-healing wounds?  No  Has the patient had any unintentional weight loss or weight gain?  No   Diabetes:  Is the patient diabetic?  Yes  If diabetic, was a CBG obtained today?  No  Did the patient bring in their glucometer from home?  No  How often do you monitor your CBG's? Every so often per patient, not everyday.   Financial Strains and Diabetes Management:  Are you having any financial strains with the device, your supplies or your medication? No .  Does the patient want to be seen by Chronic Care Management for management of their diabetes?  No  Would the patient like to be referred to a Nutritionist or for Diabetic Management?  No   Diabetic Exams:  Diabetic Eye Exam: Not completed. Overdue for diabetic eye exam. Pt has been advised about the importance in completing this exam. Pt plans to establish care at [facility]  Diabetic Foot Exam: Completed 04/25/18.   How often do you need to have someone help you when you read instructions, pamphlets, or other written materials from your  doctor or pharmacy?: 1 - Never  Interpreter Needed?: No  Information entered by :: Clemetine Marker LPN  Past Medical History:  Diagnosis Date  . Arthritis   . Diabetes mellitus without complication ([place])   . Diet controlled gestational diabetes mellitus in puerperium   . Emphysema of lung ([place])   . 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 GERD (gastroesophageal reflux disease)   . Hyperlipidemia    Past Surgical History:  Procedure Laterality Date  . COLONOSCOPY  WITH PROPOFOL N/A 03/05/2016   Procedure: COLONOSCOPY WITH PROPOFOL;  Surgeon: [name], MD;  Location: [facility];  Service: Endoscopy;  Laterality: N/A;  . HIP FRACTURE SURGERY Right 03/2013  . INNER EAR SURGERY Bilateral   . REPLACEMENT TOTAL KNEE Right 02/2015  . SHOULDER SURGERY Bilateral    rotator cuff   Family History  Problem Relation Age of Onset  . Cervical cancer Mother   . Colon cancer Mother   . Diabetes Father   . Heart disease Father   . Breast cancer Sister 52  . Breast cancer Cousin 40       pat cousin  . Diabetes Sister   . Diabetes Sister   . Congestive Heart Failure Paternal Grandmother   . Diabetes Paternal Grandmother    Social History   Socioeconomic History  . Marital status: Widowed    Spouse name: Not on file  . Number of children: 2  . Years of education: Not on file  . Highest education level: Some college, no degree  Occupational History  . Occupation: retired  Scientific laboratory technician  . Financial resource strain: Not very hard  . Food insecurity    Worry: Never true    Inability: Never true  . Transportation needs    Medical: No    Non-medical: No  Tobacco Use  . Smoking status: Former Smoker    Packs/day: 0.50    Years: 20.00    Pack years: 10.00    Types: Cigarettes    Quit date: 03/05/2016    Years since quitting: 2.8  . Smokeless tobacco: Never Used  Substance and Sexual Activity  . Alcohol use: No  . Drug use: No  . Sexual activity: Not Currently  Lifestyle  . Physical activity    Days per week: 0 days    Minutes per session: 0 min  . Stress: To some extent  Relationships  . Social connections    Talks on phone: More than three times a week    Gets together: More than three times a week    Attends religious service: Never    Active member of club or organization: No    Attends meetings of clubs or organizations: Never    Relationship status: Widowed  Other Topics Concern  . Not on file  Social History Narrative  .  Not on file    Outpatient Encounter Medications as of 12/29/2018  Medication Sig  . albuterol (VENTOLIN HFA) 108 (90 Base) MCG/ACT inhaler INHALE 2 PUFFS BY MOUTH INTO THE LUNGS EVERY 6 HOURS AS NEEDED FOR WHEEZING OR SHORTNESS OF BREATH  . ALPRAZolam (XANAX) 0.5 MG tablet Take 0.5 mg by mouth 3 (three) times daily as needed.  Marland Kitchen atorvastatin (LIPITOR) 20 MG tablet Take 1 tablet (20 mg total) by mouth at bedtime.  . Blood Glucose Monitoring Suppl (ONETOUCH VERIO) w/Device KIT 1 each by Does not apply route 2 (two) times daily. Test BS twice daily.  . diclofenac sodium (VOLTAREN) 1 % GEL APP  4 GRAMS EXT AA QID UTD  . fluticasone (FLONASE) 50 MCG/ACT nasal spray Place 2 sprays into both nostrils daily.  . Fluticasone-Salmeterol (ADVAIR DISKUS) 250-50 MCG/DOSE AEPB Inhale 1 puff into the lungs 2 (two) times daily.  Marland Kitchen gabapentin (NEURONTIN) 600 MG tablet Take 1 tablet (600 mg total) by mouth 3 (three) times daily.  Marland Kitchen glucose blood test strip Test BS twice daily.  . metFORMIN (GLUCOPHAGE-XR) 500 MG 24 hr tablet Take 1 tablet (500 mg total) by mouth 2 (two) times daily. 2 in the AM and 1 in PM  . omeprazole (PRILOSEC OTC) 20 MG tablet Take 1 tablet (20 mg total) by mouth daily.  Marland Kitchen oxyCODONE-acetaminophen (PERCOCET) 10-325 MG tablet TK 1 T PO Q 4 HOURS PRN FOR PAIN. MAX OF 4 TS PER DAY  . pramipexole (MIRAPEX) 0.125 MG tablet Mirapex 0.125 mg tablet  1 to 2 in pm for RLS  . sitaGLIPtin (JANUVIA) 100 MG tablet Take 1 tablet (100 mg total) by mouth daily.  Marland Kitchen tiZANidine (ZANAFLEX) 4 MG tablet Take 4 mg by mouth every 6 (six) hours as needed.    No facility-administered encounter medications on file as of 12/29/2018.     Activities of Daily Living In your present state of health, do you have any difficulty performing the following activities: 12/29/2018  Hearing? N  Comment declines hearing aids  Vision? N  Difficulty concentrating or making decisions? Y  Walking or climbing stairs? Y  Dressing or  bathing? N  Doing errands, shopping? Y  Preparing Food and eating ? N  Using the Toilet? N  In the past six months, have you accidently leaked urine? Y  Do you have problems with loss of bowel control? N  Managing your Medications? N  Managing your Finances? N  Housekeeping or managing your Housekeeping? N  Some recent data might be hidden    Patient Care Team: Glean Hess, MD as PCP - General (Internal Medicine) [place], Jimmy J, MD as Consulting Physician (Physical Medicine and Rehabilitation) Melvyn Novas, MD as Referring Physician (Orthopedic Surgery)    Assessment:   This is a routine wellness examination for [place].  Exercise Activities and Dietary recommendations Current Exercise Habits: The patient does not participate in regular exercise at present, Exercise limited by: orthopedic condition(s)  Goals    . DIET - EAT MORE FRUITS AND VEGETABLES     Recommend 3-4 servings of fruits and vegetables per day.    . Increase physical activity     Pt would like to increase muscle tone to transition from walker to cane. Pt will have nerve block done on 01/09/19 and physical therapy to follow.     . Prevent Falls     Recommend to remove items from home that may cause trips or slips       Fall Risk Fall Risk  12/29/2018 10/29/2018 04/25/2018 12/25/2017 12/24/2016  Falls in the past year? 1 1 0 0 Yes  Number falls in past yr: 1 1 0 - 2 or more  Injury with Fall? 0 0 0 - No  Risk Factor Category  - - - - High Fall Risk  Comment - - - - Spastic parapalegia  Risk for fall due to : Impaired mobility;Impaired balance/gait;History of fall(s) History of fall(s);Impaired balance/gait;Impaired mobility;Medication side effect - - Impaired balance/gait;Impaired mobility  Follow up Falls prevention discussed Falls evaluation completed;Falls prevention discussed - - Education provided;Falls prevention discussed   FALL RISK PREVENTION PERTAINING TO THE HOME:  Any  stairs in or around the  home? Yes  If so, do they handrails? Yes   Home free of loose throw rugs in walkways, pet beds, electrical cords, etc? Yes  Adequate lighting in your home to reduce risk of falls? Yes   ASSISTIVE DEVICES UTILIZED TO PREVENT FALLS:  Life alert? No  Use of a cane, walker or w/c? Yes  Grab bars in the bathroom? No  Shower chair or bench in shower? Yes  Elevated toilet seat or a handicapped toilet? No   DME ORDERS:  DME order needed?  No   TIMED UP AND GO:  Was the test performed? No . Telephonic visit.   Education: Fall risk prevention has been discussed.  Intervention(s) required? No   Depression Screen PHQ 2/9 Scores 12/29/2018 10/29/2018 04/25/2018 12/25/2017  PHQ - 2 Score 2 1 0 6  PHQ- 9 Score '6 11 2 10     ' Cognitive Function     6CIT Screen 12/29/2018  What Year? 0 points  What month? 0 points  What time? 0 points  Count back from 20 0 points  Months in reverse 0 points  Repeat phrase 0 points  Total Score 0    Immunization History  Administered Date(s) Administered  . Influenza, Seasonal, Injecte, Preservative Fre 11/29/2011  . Influenza,inj,Quad PF,6+ Mos 11/24/2014, 11/25/2015, 12/24/2016, 12/11/2017, 10/29/2018  . Influenza,inj,quad, With Preservative 03/08/2017  . Pneumococcal Conjugate-13 11/24/2014  . Pneumococcal Polysaccharide-23 05/20/2012    Qualifies for Shingles Vaccine? Yes . Due for Shingrix. Education has been provided regarding the importance of this vaccine. Pt has been advised to call insurance company to determine out of pocket expense. Advised may also receive vaccine at local pharmacy or Health Dept. Verbalized acceptance and understanding.  Tdap: Although this vaccine is not a covered service during a Wellness Exam, does the patient still wish to receive this vaccine today?  No .  Education has been provided regarding the importance of this vaccine. Advised may receive this vaccine at local pharmacy or Health Dept. Aware to provide a copy of  the vaccination record if obtained from local pharmacy or Health Dept. Verbalized acceptance and understanding.  Flu Vaccine: Up to date  Pneumococcal Vaccine: Up to date   Screening Tests Health Maintenance  Topic Date Due  . OPHTHALMOLOGY EXAM  08/05/1965  . TETANUS/TDAP  08/06/1974  . MAMMOGRAM  04/17/2018  . HIV Screening  04/25/2019 (Originally [birth year]1970)  . Hepatitis C Screening  03/27/2020 (Originally 03-27-1955)  . FOOT EXAM  04/25/2019  . URINE MICROALBUMIN  04/25/2019  . HEMOGLOBIN A1C  04/28/2019  . PAP SMEAR-Modifier  03/18/2020  . COLONOSCOPY  03/05/2021  . INFLUENZA VACCINE  Completed  . PNEUMOCOCCAL POLYSACCHARIDE VACCINE AGE 73-64 HIGH RISK  Completed    Cancer Screenings:  Colorectal Screening: Completed 03/05/16. Repeat every 5 years;   Mammogram: Completed 04/17/17. Repeat every year. Ordered 04/25/18. Pt provided with contact information and advised to call to schedule appt.   Bone Density: Completed per patient with [facility] 2015. Results reflect OSTEOPOROSIS per patient (not in record). Pt scheduled for dexa 01/02/19 at [facility].   Lung Cancer Screening: (Low Dose CT Chest recommended if Age 55-80 years, 30 pack-year currently smoking OR have quit w/in 15years.) does not qualify.   Additional Screening:  Hepatitis C Screening: does qualify; postponed  Vision Screening: Recommended annual ophthalmology exams for early detection of glaucoma and other disorders of the eye. Is the patient up to date with their annual eye exam?  No  Who is the provider or what is the name of the office in which the pt attends annual eye exams? Plans to establish care at [facility]: Recommended annual dental exams for proper oral hygiene  Community Resource Referral:  CRR required this visit?  No      Plan:     I have personally reviewed and addressed the Medicare Annual Wellness questionnaire and have noted the following in the patient's  chart:  A. Medical and social history B. Use of alcohol, tobacco or illicit drugs  C. Current medications and supplements D. Functional ability and status E.  Nutritional status F.  Physical activity G. Advance directives H. List of other physicians I.  Hospitalizations, surgeries, and ER visits in previous 12 months J.  [place] such as hearing and vision if needed, cognitive and depression L. Referrals and appointments   In addition, I have reviewed and discussed with patient certain preventive protocols, quality metrics, and best practice recommendations. A written personalized care plan for preventive services as well as general preventive health recommendations were provided to patient.   Signed,  Clemetine Marker, LPN Nurse Health Advisor   Nurse Notes: pt states she will be having a nerve block done on 01/09/19 and then plans on physical therapy to help transition from walker to cane. She will also be getting a prolia injection to help with osteoporosis. Pt states blood sugars are 180-200 fasting when she checks them but does not check often. Pt appreciative of visit today.

## 2018-12-29 NOTE — Patient Instructions (Signed)
Hannah [name] , Thank you for taking time to come for your Medicare Wellness Visit. I appreciate your ongoing commitment to your health goals. Please review the following plan we discussed and let me know if I can assist you in the future.   Screening recommendations/referrals: Colonoscopy: done 03/05/16. Repeat in 2023.  Mammogram: done 04/17/17. Please call [phone] to schedule your mammogram.  Bone Density: scheduled for 01/02/19 Recommended yearly ophthalmology/optometry visit for glaucoma screening and checkup Recommended yearly dental visit for hygiene and checkup  Vaccinations: Influenza vaccine: done 10/29/18 Pneumococcal vaccine: done 11/24/14 Tdap vaccine: due Shingles vaccine: Shingrix discussed. Please contact your pharmacy for coverage information.   Advanced directives: Advance directive discussed with you today. I have provided a copy for you to complete at home and have notarized. Once this is complete please bring a copy in to our office so we can scan it into your chart.  Conditions/risks identified: Pt plans to complete physical therapy to increase muscle tone.   Next appointment: Please follow up in one year for your Medicare Annual Wellness visit.    Preventive Care 40-64 Years, Female Preventive care refers to lifestyle choices and visits with your health care provider that can promote health and wellness. What does preventive care include?  A yearly physical exam. This is also called an annual well check.  Dental exams once or twice a year.  Routine eye exams. Ask your health care provider how often you should have your eyes checked.  Personal lifestyle choices, including:  Daily care of your teeth and gums.  Regular physical activity.  Eating a healthy diet.  Avoiding tobacco and drug use.  Limiting alcohol use.  Practicing safe sex.  Taking low-dose aspirin daily starting at age 21.  Taking vitamin and mineral supplements as recommended by your  health care provider. What happens during an annual well check? The services and screenings done by your health care provider during your annual well check will depend on your age, overall health, lifestyle risk factors, and family history of disease. Counseling  Your health care provider may ask you questions about your:  Alcohol use.  Tobacco use.  Drug use.  Emotional well-being.  Home and relationship well-being.  Sexual activity.  Eating habits.  Work and work Statistician.  Method of birth control.  Menstrual cycle.  Pregnancy history. Screening  You may have the following tests or measurements:  Height, weight, and BMI.  Blood pressure.  Lipid and cholesterol levels. These may be checked every 5 years, or more frequently if you are over 68 years old.  Skin check.  Lung cancer screening. You may have this screening every year starting at age 80 if you have a 30-pack-year history of smoking and currently smoke or have quit within the past 15 years.  Fecal occult blood test (FOBT) of the stool. You may have this test every year starting at age 37.  Flexible sigmoidoscopy or colonoscopy. You may have a sigmoidoscopy every 5 years or a colonoscopy every 10 years starting at age 26.  Hepatitis C blood test.  Hepatitis B blood test.  Sexually transmitted disease (STD) testing.  Diabetes screening. This is done by checking your blood sugar (glucose) after you have not eaten for a while (fasting). You may have this done every 1-3 years.  Mammogram. This may be done every 1-2 years. Talk to your health care provider about when you should start having regular mammograms. This may depend on whether you have a family history of  breast cancer.  BRCA-related cancer screening. This may be done if you have a family history of breast, ovarian, tubal, or peritoneal cancers.  Pelvic exam and Pap test. This may be done every 3 years starting at age 36. Starting at age 79,  this may be done every 5 years if you have a Pap test in combination with an HPV test.  Bone density scan. This is done to screen for osteoporosis. You may have this scan if you are at high risk for osteoporosis. Discuss your test results, treatment options, and if necessary, the need for more tests with your health care provider. Vaccines  Your health care provider may recommend certain vaccines, such as:  Influenza vaccine. This is recommended every year.  Tetanus, diphtheria, and acellular pertussis (Tdap, Td) vaccine. You may need a Td booster every 10 years.  Zoster vaccine. You may need this after age 18.  Pneumococcal 13-valent conjugate (PCV13) vaccine. You may need this if you have certain conditions and were not previously vaccinated.  Pneumococcal polysaccharide (PPSV23) vaccine. You may need one or two doses if you smoke cigarettes or if you have certain conditions. Talk to your health care provider about which screenings and vaccines you need and how often you need them. This information is not intended to replace advice given to you by your health care provider. Make sure you discuss any questions you have with your health care provider. Document Released: 03/04/2015 Document Revised: 10/26/2015 Document Reviewed: 12/07/2014 Elsevier Interactive Patient Education  2017 [place] Prevention in the Home Falls can cause injuries. They can happen to people of all ages. There are many things you can do to make your home safe and to help prevent falls. What can I do on the outside of my home?  Regularly fix the edges of walkways and driveways and fix any cracks.  Remove anything that might make you trip as you walk through a door, such as a raised step or threshold.  Trim any bushes or trees on the path to your home.  Use bright outdoor lighting.  Clear any walking paths of anything that might make someone trip, such as rocks or tools.  Regularly check to see  if handrails are loose or broken. Make sure that both sides of any steps have handrails.  Any raised decks and porches should have guardrails on the edges.  Have any leaves, snow, or ice cleared regularly.  Use sand or salt on walking paths during winter.  Clean up any spills in your garage right away. This includes oil or grease spills. What can I do in the bathroom?  Use night lights.  Install grab bars by the toilet and in the tub and shower. Do not use towel bars as grab bars.  Use non-skid mats or decals in the tub or shower.  If you need to sit down in the shower, use a plastic, non-slip stool.  Keep the floor dry. Clean up any water that spills on the floor as soon as it happens.  Remove soap buildup in the tub or shower regularly.  Attach bath mats securely with double-sided non-slip rug tape.  Do not have throw rugs and other things on the floor that can make you trip. What can I do in the bedroom?  Use night lights.  Make sure that you have a light by your bed that is easy to reach.  Do not use any sheets or blankets that are too big for  your bed. They should not hang down onto the floor.  Have a firm chair that has side arms. You can use this for support while you get dressed.  Do not have throw rugs and other things on the floor that can make you trip. What can I do in the kitchen?  Clean up any spills right away.  Avoid walking on wet floors.  Keep items that you use a lot in easy-to-reach places.  If you need to reach something above you, use a strong step stool that has a grab bar.  Keep electrical cords out of the way.  Do not use floor polish or wax that makes floors slippery. If you must use wax, use non-skid floor wax.  Do not have throw rugs and other things on the floor that can make you trip. What can I do with my stairs?  Do not leave any items on the stairs.  Make sure that there are handrails on both sides of the stairs and use them.  Fix handrails that are broken or loose. Make sure that handrails are as long as the stairways.  Check any carpeting to make sure that it is firmly attached to the stairs. Fix any carpet that is loose or worn.  Avoid having throw rugs at the top or bottom of the stairs. If you do have throw rugs, attach them to the floor with carpet tape.  Make sure that you have a light switch at the top of the stairs and the bottom of the stairs. If you do not have them, ask someone to add them for you. What else can I do to help prevent falls?  Wear shoes that:  Do not have high heels.  Have rubber bottoms.  Are comfortable and fit you well.  Are closed at the toe. Do not wear sandals.  If you use a stepladder:  Make sure that it is fully opened. Do not climb a closed stepladder.  Make sure that both sides of the stepladder are locked into place.  Ask someone to hold it for you, if possible.  Clearly mark and make sure that you can see:  Any grab bars or handrails.  First and last steps.  Where the edge of each step is.  Use tools that help you move around (mobility aids) if they are needed. These include:  Canes.  Walkers.  Scooters.  Crutches.  Turn on the lights when you go into a dark area. Replace any light bulbs as soon as they burn out.  Set up your furniture so you have a clear path. Avoid moving your furniture around.  If any of your floors are uneven, fix them.  If there are any pets around you, be aware of where they are.  Review your medicines with your doctor. Some medicines can make you feel dizzy. This can increase your chance of falling. Ask your doctor what other things that you can do to help prevent falls. This information is not intended to replace advice given to you by your health care provider. Make sure you discuss any questions you have with your health care provider. Document Released: 12/02/2008 Document Revised: 07/14/2015 Document Reviewed:  03/12/2014 Elsevier Interactive Patient Education  2017 [employer].

## 2019-01-02 DIAGNOSIS — M47816 Spondylosis without myelopathy or radiculopathy, lumbar region: Secondary | ICD-10-CM | POA: Diagnosis not present

## 2019-01-02 DIAGNOSIS — M6281 Muscle weakness (generalized): Secondary | ICD-10-CM | POA: Diagnosis not present

## 2019-01-02 DIAGNOSIS — R269 Unspecified abnormalities of gait and mobility: Secondary | ICD-10-CM | POA: Diagnosis not present

## 2019-01-02 DIAGNOSIS — M5416 Radiculopathy, lumbar region: Secondary | ICD-10-CM | POA: Diagnosis not present

## 2019-01-09 DIAGNOSIS — M47817 Spondylosis without myelopathy or radiculopathy, lumbosacral region: Secondary | ICD-10-CM | POA: Diagnosis not present

## 2019-01-23 DIAGNOSIS — M47816 Spondylosis without myelopathy or radiculopathy, lumbar region: Secondary | ICD-10-CM | POA: Diagnosis not present

## 2019-01-23 DIAGNOSIS — M6281 Muscle weakness (generalized): Secondary | ICD-10-CM | POA: Diagnosis not present

## 2019-01-23 DIAGNOSIS — R269 Unspecified abnormalities of gait and mobility: Secondary | ICD-10-CM | POA: Diagnosis not present

## 2019-01-28 ENCOUNTER — Other Ambulatory Visit: Payer: Self-pay

## 2019-01-28 ENCOUNTER — Ambulatory Visit (INDEPENDENT_AMBULATORY_CARE_PROVIDER_SITE_OTHER): Payer: Medicare HMO | Admitting: Internal Medicine

## 2019-01-28 ENCOUNTER — Encounter: Payer: Self-pay | Admitting: Internal Medicine

## 2019-01-28 VITALS — BP 116/68 | HR 78 | Ht 64.0 in | Wt 152.0 lb

## 2019-01-28 DIAGNOSIS — E118 Type 2 diabetes mellitus with unspecified complications: Secondary | ICD-10-CM | POA: Diagnosis not present

## 2019-01-28 DIAGNOSIS — B3731 Acute candidiasis of vulva and vagina: Secondary | ICD-10-CM

## 2019-01-28 DIAGNOSIS — B373 Candidiasis of vulva and vagina: Secondary | ICD-10-CM | POA: Diagnosis not present

## 2019-01-28 LAB — GLUCOSE, POCT (MANUAL RESULT ENTRY): POC Glucose: 175 mg/dl — AB (ref 70–99)

## 2019-01-28 MED ORDER — FLUCONAZOLE 100 MG PO TABS: 100.0000 mg | ORAL_TABLET | Freq: Every day | ORAL | 0 refills | Status: AC

## 2019-01-28 NOTE — Progress Notes (Signed)
Date:  01/28/2019   Name:  Hannah [name]   DOB:  1955-05-16   MRN:  [id number]   Chief Complaint: Vaginal Itching (Burning while urination, frequency. X 4 days. Better this morning. Blood sugar spikes and she thinks its because she has an infection. Extreme vaginal itching inside and out. Swelling on outside of vagina. Blood on tissue when wiping.  ) and Diabetes (Blood sugar spiking. Monday blood sugar spiked to 378. She did not feel well. Felt weak and dizzy but did not call the ambulance because she was scared to go to any hospital due to the virus. Took 2 metformin and then slept for 12 hours straight and still feels slightly lethergic. When she is driving she is missing turns and gettind a little lost while driving. )  Vaginal Itching The patient's primary symptoms include genital itching and vaginal discharge. This is a new problem. The current episode started in the past 7 days. The problem occurs constantly. The problem has been gradually worsening. The pain is mild. The problem affects both sides. Associated symptoms include dysuria. Pertinent negatives include no abdominal pain, constipation, diarrhea, frequency, headaches or urgency. The vaginal discharge was copious and white. There has been no bleeding. Treatments tried: vagisil and vaseline. The treatment provided mild relief.  Diabetes She has type 2 diabetes mellitus. Disease course: very high blood sugars recently. Pertinent negatives for hypoglycemia include no dizziness or headaches. Associated symptoms include fatigue and weakness. Pertinent negatives for diabetes include no blurred vision, no chest pain and no weight loss. (Fatigue, confusion recently - along with higher glucoses) Current diabetic treatment includes oral agent (dual therapy) (januvia added last visit). She is compliant with treatment all of the time. An ACE inhibitor/angiotensin II receptor blocker is being taken.    Lab Results  Component Value Date   CREATININE 0.65 10/29/2018   BUN 6 (L) 10/29/2018   NA 139 10/29/2018   K 4.6 10/29/2018   CL 101 10/29/2018   CO2 25 10/29/2018   Lab Results  Component Value Date   CHOL 223 (H) 10/29/2018   HDL 64 10/29/2018   LDLCALC 136 (H) 10/29/2018   TRIG 132 10/29/2018   CHOLHDL 3.5 10/29/2018   Lab Results  Component Value Date   TSH 1.440 04/25/2018   Lab Results  Component Value Date   HGBA1C 8.8 (H) 10/29/2018     Review of Systems  Constitutional: Positive for fatigue. Negative for diaphoresis and weight loss.  HENT: Negative for trouble swallowing.   Eyes: Negative for blurred vision.  Respiratory: Negative for cough, chest tightness, shortness of breath and wheezing.   Cardiovascular: Negative for chest pain, palpitations and leg swelling.  Gastrointestinal: Negative for abdominal pain, constipation and diarrhea.  Genitourinary: Positive for dysuria and vaginal discharge. Negative for frequency and urgency.  Neurological: Positive for weakness. Negative for dizziness, light-headedness and headaches.    Patient Active Problem List   Diagnosis Date Noted  . 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 Special screening for malignant neoplasms, colon   . Benign neoplasm of descending colon   . Other osteoporosis without current pathological fracture 11/25/2015  . Hyperlipidemia associated with type 2 diabetes mellitus ([place]) 03/28/2015  . Dysphagia 11/11/2014  . 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 Type II diabetes mellitus with complication ([place]) 01/05/3566  . COPD (chronic obstructive pulmonary disease) with emphysema ([place]) 11/11/2014  . Tobacco use disorder, mild, 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  . Degenerative joint disease involving multiple joints  09/28/2013    Allergies  Allergen Reactions  . Levofloxacin Nausea And Vomiting    Past Surgical History:  Procedure Laterality Date  . COLONOSCOPY WITH PROPOFOL N/A 03/05/2016    Procedure: COLONOSCOPY WITH PROPOFOL;  Surgeon: [name], MD;  Location: [facility];  Service: Endoscopy;  Laterality: N/A;  . HIP FRACTURE SURGERY Right 03/2013  . INNER EAR SURGERY Bilateral   . REPLACEMENT TOTAL KNEE Right 02/2015  . SHOULDER SURGERY Bilateral    rotator cuff    Social History   Tobacco Use  . Smoking status: Former Smoker    Packs/day: 0.50    Years: 20.00    Pack years: 10.00    Types: Cigarettes    Quit date: 03/05/2016    Years since quitting: 2.9  . Smokeless tobacco: Never Used  Substance Use Topics  . Alcohol use: No  . Drug use: No     Medication list has been reviewed and updated.  Current Meds  Medication Sig  . albuterol (VENTOLIN HFA) 108 (90 Base) MCG/ACT inhaler INHALE 2 PUFFS BY MOUTH INTO THE LUNGS EVERY 6 HOURS AS NEEDED FOR WHEEZING OR SHORTNESS OF BREATH  . ALPRAZolam (XANAX) 0.5 MG tablet Take 0.5 mg by mouth 3 (three) times daily as needed.  Marland Kitchen atorvastatin (LIPITOR) 20 MG tablet Take 1 tablet (20 mg total) by mouth at bedtime.  . Blood Glucose Monitoring Suppl (ONETOUCH VERIO) w/Device KIT 1 each by Does not apply route 2 (two) times daily. Test BS twice daily.  . diclofenac sodium (VOLTAREN) 1 % GEL APP 4 GRAMS EXT AA QID UTD  . fluticasone (FLONASE) 50 MCG/ACT nasal spray Place 2 sprays into both nostrils daily.  . Fluticasone-Salmeterol (ADVAIR DISKUS) 250-50 MCG/DOSE AEPB Inhale 1 puff into the lungs 2 (two) times daily.  Marland Kitchen gabapentin (NEURONTIN) 600 MG tablet Take 1 tablet (600 mg total) by mouth 3 (three) times daily.  Marland Kitchen glucose blood test strip Test BS twice daily.  . metFORMIN (GLUCOPHAGE-XR) 500 MG 24 hr tablet Take 1 tablet (500 mg total) by mouth 2 (two) times daily. 2 in the AM and 1 in PM  . omeprazole (PRILOSEC OTC) 20 MG tablet Take 1 tablet (20 mg total) by mouth daily. (Patient taking differently: Take 20 mg by mouth daily. Taking capsule and not tablet.)  . oxyCODONE-acetaminophen (PERCOCET) 10-325 MG tablet  TK 1 T PO Q 4 HOURS PRN FOR PAIN. MAX OF 4 TS PER DAY  . pramipexole (MIRAPEX) 0.125 MG tablet Mirapex 0.125 mg tablet  1 to 2 in pm for RLS  . sitaGLIPtin (JANUVIA) 100 MG tablet Take 1 tablet (100 mg total) by mouth daily.  Marland Kitchen tiZANidine (ZANAFLEX) 4 MG tablet Take 4 mg by mouth every 6 (six) hours as needed.     PHQ 2/9 Scores 12/29/2018 10/29/2018 04/25/2018 12/25/2017  PHQ - 2 Score 2 1 0 6  PHQ- 9 Score _0 BP Readings from Last 3 Encounters:  01/28/19 116/68  12/29/18 122/78  10/29/18 124/68    Physical Exam Constitutional:      Appearance: Normal appearance.  Neck:     Musculoskeletal: Normal range of motion.  Cardiovascular:     Rate and Rhythm: Normal rate and regular rhythm.  Pulmonary:     Effort: Pulmonary effort is normal.     Breath sounds: Normal breath sounds. No wheezing.  Abdominal:     General: There is no distension.     Tenderness: There is no  abdominal tenderness.  Lymphadenopathy:     Cervical: No cervical adenopathy.  Skin:    Capillary Refill: Capillary refill takes less than 2 seconds.  Neurological:     Mental Status: She is alert. Mental status is at baseline.     Gait: Gait abnormal.  Psychiatric:        Mood and Affect: Mood normal.        Thought Content: Thought content normal.        Judgment: Judgment normal.     Wt Readings from Last 3 Encounters:  01/28/19 152 lb (68.9 kg)  12/29/18 152 lb (68.9 kg)  10/29/18 152 lb (68.9 kg)    BP 116/68   Pulse 78   Ht _0  (1.626 m)   Wt 152 lb (68.9 kg)   SpO2 97%   BMI 26.09 kg/m   Assessment and Plan: 1. Yeast vaginitis Severe symptoms - will treat for 7 days since diabetic Continue topical ointment for comfort - fluconazole (DIFLUCAN) 100 MG tablet; Take 1 tablet (100 mg total) by mouth daily for 7 days.  Dispense: 7 tablet; Refill: 0  2. Type II diabetes mellitus with complication (HCC) Glucoses higher - likely due to vaginal infection Continue metformin 3 tabs per day  and Januvia 100 mg - Comprehensive metabolic panel - Hemoglobin A1c - POCT Glucose (CBG) - 175   Partially dictated using Editor, commissioning. Any errors are unintentional.  Halina Maidens, MD [facility]  01/28/2019

## 2019-01-29 LAB — COMPREHENSIVE METABOLIC PANEL
ALT: 14 IU/L (ref 0–32)
AST: 13 IU/L (ref 0–40)
Albumin/Globulin Ratio: 2.2 (ref 1.2–2.2)
Albumin: 4.3 g/dL (ref 3.8–4.8)
Alkaline Phosphatase: 102 IU/L (ref 39–117)
BUN/Creatinine Ratio: 23 (ref 12–28)
BUN: 13 mg/dL (ref 8–27)
Bilirubin Total: 0.3 mg/dL (ref 0.0–1.2)
CO2: 23 mmol/L (ref 20–29)
Calcium: 9.8 mg/dL (ref 8.7–10.3)
Chloride: 103 mmol/L (ref 96–106)
Creatinine, Ser: 0.57 mg/dL (ref 0.57–1.00)
GFR calc Af Amer: 114 mL/min/{1.73_m2} (ref >59)
GFR calc non Af Amer: 99 mL/min/{1.73_m2} (ref >59)
Globulin, Total: 2 g/dL (ref 1.5–4.5)
Glucose: 175 mg/dL — ABNORMAL HIGH (ref 65–99)
Potassium: 4.6 mmol/L (ref 3.5–5.2)
Sodium: 140 mmol/L (ref 134–144)
Total Protein: 6.3 g/dL (ref 6.0–8.5)

## 2019-01-29 LAB — HEMOGLOBIN A1C
Est. average glucose Bld gHb Est-mCnc: 246 mg/dL
Hgb A1c MFr Bld: 10.2 % — ABNORMAL HIGH (ref 4.8–5.6)

## 2019-02-07 DIAGNOSIS — R69 Illness, unspecified: Secondary | ICD-10-CM | POA: Diagnosis not present

## 2019-02-16 ENCOUNTER — Ambulatory Visit (INDEPENDENT_AMBULATORY_CARE_PROVIDER_SITE_OTHER): Payer: Medicare HMO | Admitting: Internal Medicine

## 2019-02-16 ENCOUNTER — Other Ambulatory Visit: Payer: Self-pay

## 2019-02-16 ENCOUNTER — Encounter: Payer: Self-pay | Admitting: Internal Medicine

## 2019-02-16 VITALS — BP 128/78 | HR 94 | Temp 98.5°F | Ht 64.0 in | Wt 149.8 lb

## 2019-02-16 DIAGNOSIS — E118 Type 2 diabetes mellitus with unspecified complications: Secondary | ICD-10-CM

## 2019-02-16 NOTE — Progress Notes (Signed)
Date:  02/16/2019   Name:  Hannah [name]   DOB:  Feb 24, 1955   MRN:  [id number]   Chief Complaint: No chief complaint on file.  Diabetes She presents for her follow-up diabetic visit. She has type 2 diabetes mellitus. Her disease course has been worsening. Pertinent negatives for hypoglycemia include no headaches or tremors. Pertinent negatives for diabetes include no chest pain, no fatigue, no polydipsia and no polyuria. Current diabetic treatments: metformin and januvia. She is compliant with treatment most of the time. Her weight is stable. She monitors blood glucose at home 1-2 x per day. Her breakfast blood glucose is taken between 6-7 am. Her breakfast blood glucose range is generally >200 mg/dl. Her dinner blood glucose is taken between 5-6 pm. Her dinner blood glucose range is generally 180-200 mg/dl.    Lab Results  Component Value Date   CREATININE 0.57 01/28/2019   BUN 13 01/28/2019   NA 140 01/28/2019   K 4.6 01/28/2019   CL 103 01/28/2019   CO2 23 01/28/2019   Lab Results  Component Value Date   CHOL 223 (H) 10/29/2018   HDL 64 10/29/2018   LDLCALC 136 (H) 10/29/2018   TRIG 132 10/29/2018   CHOLHDL 3.5 10/29/2018   Lab Results  Component Value Date   TSH 1.440 04/25/2018   Lab Results  Component Value Date   HGBA1C 10.2 (H) 01/28/2019     Review of Systems  Constitutional: Positive for appetite change and unexpected weight change. Negative for fatigue and fever.  HENT: Negative for tinnitus and trouble swallowing.   Eyes: Negative for visual disturbance.  Respiratory: Negative for cough, chest tightness and shortness of breath.   Cardiovascular: Negative for chest pain, palpitations and leg swelling.  Gastrointestinal: Negative for abdominal pain.  Endocrine: Negative for polydipsia and polyuria.  Genitourinary: Negative for dysuria, hematuria and vaginal discharge.  Musculoskeletal: Negative for arthralgias.  Neurological: Negative for tremors,  numbness and headaches.  Psychiatric/Behavioral: Negative for dysphoric mood.    Patient Active Problem List   Diagnosis Date Noted  . 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 Special screening for malignant neoplasms, colon   . Benign neoplasm of descending colon   . Other osteoporosis without current pathological fracture 11/25/2015  . Hyperlipidemia associated with type 2 diabetes mellitus ([place]) 03/28/2015  . Dysphagia 11/11/2014  . 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 Type II diabetes mellitus with complication ([place]) 26/20/3559  . 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 Tobacco use disorder, mild, 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  . Degenerative joint disease involving multiple joints 09/28/2013    Allergies  Allergen Reactions  . Levofloxacin Nausea And Vomiting    Past Surgical History:  Procedure Laterality Date  . COLONOSCOPY WITH PROPOFOL N/A 03/05/2016   Procedure: COLONOSCOPY WITH PROPOFOL;  Surgeon: [name], MD;  Location: [facility];  Service: Endoscopy;  Laterality: N/A;  . HIP FRACTURE SURGERY Right 03/2013  . INNER EAR SURGERY Bilateral   . REPLACEMENT TOTAL KNEE Right 02/2015  . SHOULDER SURGERY Bilateral    rotator cuff    Social History   Tobacco Use  . Smoking status: Former Smoker    Packs/day: 0.50    Years: 20.00    Pack years: 10.00    Types: Cigarettes    Quit date: 03/05/2016    Years since quitting: 2.9  . Smokeless tobacco: Never Used  Substance Use Topics  . Alcohol use: No  . Drug  use: No     Medication list has been reviewed and updated.  Current Meds  Medication Sig  . albuterol (VENTOLIN HFA) 108 (90 Base) MCG/ACT inhaler INHALE 2 PUFFS BY MOUTH INTO THE LUNGS EVERY 6 HOURS AS NEEDED FOR WHEEZING OR SHORTNESS OF BREATH  . ALPRAZolam (XANAX) 0.5 MG tablet Take 0.5 mg by mouth 3 (three) times daily as needed.  Marland Kitchen atorvastatin (LIPITOR) 20 MG tablet  Take 1 tablet (20 mg total) by mouth at bedtime.  . Blood Glucose Monitoring Suppl (ONETOUCH VERIO) w/Device KIT 1 each by Does not apply route 2 (two) times daily. Test BS twice daily.  . diclofenac sodium (VOLTAREN) 1 % GEL APP 4 GRAMS EXT AA QID UTD  . fluticasone (FLONASE) 50 MCG/ACT nasal spray Place 2 sprays into both nostrils daily.  . Fluticasone-Salmeterol (ADVAIR DISKUS) 250-50 MCG/DOSE AEPB Inhale 1 puff into the lungs 2 (two) times daily.  Marland Kitchen gabapentin (NEURONTIN) 600 MG tablet Take 1 tablet (600 mg total) by mouth 3 (three) times daily.  Marland Kitchen glucose blood test strip Test BS twice daily.  . metFORMIN (GLUCOPHAGE-XR) 500 MG 24 hr tablet Take 1 tablet (500 mg total) by mouth 2 (two) times daily. 2 in the AM and 1 in PM  . omeprazole (PRILOSEC OTC) 20 MG tablet Take 1 tablet (20 mg total) by mouth daily. (Patient taking differently: Take 20 mg by mouth daily. Taking capsule and not tablet.)  . oxyCODONE-acetaminophen (PERCOCET) 10-325 MG tablet TK 1 T PO Q 4 HOURS PRN FOR PAIN. MAX OF 4 TS PER DAY  . pramipexole (MIRAPEX) 0.125 MG tablet Mirapex 0.125 mg tablet  1 to 2 in pm for RLS  . sitaGLIPtin (JANUVIA) 100 MG tablet Take 1 tablet (100 mg total) by mouth daily.  Marland Kitchen tiZANidine (ZANAFLEX) 4 MG tablet Take 4 mg by mouth every 6 (six) hours as needed.     PHQ 2/9 Scores 02/16/2019 12/29/2018 10/29/2018 04/25/2018  PHQ - 2 Score 0 2 1 0  PHQ- 9 Score '1 6 11 2    ' BP Readings from Last 3 Encounters:  02/16/19 128/78  01/28/19 116/68  12/29/18 122/78    Physical Exam Constitutional:      Appearance: Normal appearance.  Cardiovascular:     Rate and Rhythm: Normal rate and regular rhythm.     Pulses: Normal pulses.     Heart sounds: No murmur.  Pulmonary:     Effort: Pulmonary effort is normal.     Breath sounds: No wheezing or rhonchi.  Skin:    General: Skin is warm and dry.  Neurological:     Mental Status: She is alert.     Motor: Weakness present.     Gait: Gait abnormal.      Wt Readings from Last 3 Encounters:  02/16/19 149 lb 12.8 oz (67.9 kg)  01/28/19 152 lb (68.9 kg)  12/29/18 152 lb (68.9 kg)    BP 128/78   Pulse 94   Temp 98.5 F (36.9 C)   Ht '5\' 4"'  (1.626 m)   Wt 149 lb 12.8 oz (67.9 kg)   SpO2 94%   BMI 25.71 kg/m   Assessment and Plan: 1. Type II diabetes mellitus with complication (HCC) BS are not controlled on metformin and Januvia.  She is very reluctant to start an injectable. She reports changing her diet to some degree but BS are still over 180. Will give Steglatro 15 mg samples and recheck A1C next visit Will likely need to change  Steglatro to [place] or Vania Rea if the medication is working   Psychologist, occupational. Any errors are unintentional.  Halina Maidens, MD [facility]  02/16/2019

## 2019-02-16 NOTE — Patient Instructions (Signed)
Take Steglatro 15 mg once a day along with metformin and januvia.

## 2019-03-06 DIAGNOSIS — M47816 Spondylosis without myelopathy or radiculopathy, lumbar region: Secondary | ICD-10-CM | POA: Diagnosis not present

## 2019-03-06 DIAGNOSIS — M5416 Radiculopathy, lumbar region: Secondary | ICD-10-CM | POA: Diagnosis not present

## 2019-03-06 DIAGNOSIS — G894 Chronic pain syndrome: Secondary | ICD-10-CM | POA: Diagnosis not present

## 2019-03-06 DIAGNOSIS — M25569 Pain in unspecified knee: Secondary | ICD-10-CM | POA: Diagnosis not present

## 2019-03-06 DIAGNOSIS — M25551 Pain in right hip: Secondary | ICD-10-CM | POA: Diagnosis not present

## 2019-03-06 DIAGNOSIS — Z79899 Other long term (current) drug therapy: Secondary | ICD-10-CM | POA: Diagnosis not present

## 2019-03-06 DIAGNOSIS — M19049 Primary osteoarthritis, unspecified hand: Secondary | ICD-10-CM | POA: Diagnosis not present

## 2019-03-06 DIAGNOSIS — M25559 Pain in unspecified hip: Secondary | ICD-10-CM | POA: Diagnosis not present

## 2019-03-06 DIAGNOSIS — R202 Paresthesia of skin: Secondary | ICD-10-CM | POA: Diagnosis not present

## 2019-03-06 DIAGNOSIS — M6530 Trigger finger, unspecified finger: Secondary | ICD-10-CM | POA: Diagnosis not present

## 2019-03-06 DIAGNOSIS — G2581 Restless legs syndrome: Secondary | ICD-10-CM | POA: Diagnosis not present

## 2019-03-10 DIAGNOSIS — M25559 Pain in unspecified hip: Secondary | ICD-10-CM | POA: Diagnosis not present

## 2019-03-10 DIAGNOSIS — M25551 Pain in right hip: Secondary | ICD-10-CM | POA: Diagnosis not present

## 2019-03-18 DIAGNOSIS — H547 Unspecified visual loss: Secondary | ICD-10-CM | POA: Diagnosis not present

## 2019-03-18 DIAGNOSIS — J439 Emphysema, unspecified: Secondary | ICD-10-CM | POA: Diagnosis not present

## 2019-03-18 DIAGNOSIS — G8929 Other chronic pain: Secondary | ICD-10-CM | POA: Diagnosis not present

## 2019-03-18 DIAGNOSIS — H5360 Unspecified night blindness: Secondary | ICD-10-CM | POA: Diagnosis not present

## 2019-03-18 DIAGNOSIS — G2581 Restless legs syndrome: Secondary | ICD-10-CM | POA: Diagnosis not present

## 2019-03-18 DIAGNOSIS — K219 Gastro-esophageal reflux disease without esophagitis: Secondary | ICD-10-CM | POA: Diagnosis not present

## 2019-03-18 DIAGNOSIS — R69 Illness, unspecified: Secondary | ICD-10-CM | POA: Diagnosis not present

## 2019-03-18 DIAGNOSIS — E785 Hyperlipidemia, unspecified: Secondary | ICD-10-CM | POA: Diagnosis not present

## 2019-03-18 DIAGNOSIS — J309 Allergic rhinitis, unspecified: Secondary | ICD-10-CM | POA: Diagnosis not present

## 2019-03-18 DIAGNOSIS — E1165 Type 2 diabetes mellitus with hyperglycemia: Secondary | ICD-10-CM | POA: Diagnosis not present

## 2019-04-05 ENCOUNTER — Other Ambulatory Visit: Payer: Self-pay | Admitting: Internal Medicine

## 2019-04-05 DIAGNOSIS — E1169 Type 2 diabetes mellitus with other specified complication: Secondary | ICD-10-CM

## 2019-04-05 DIAGNOSIS — E785 Hyperlipidemia, unspecified: Secondary | ICD-10-CM

## 2019-04-29 ENCOUNTER — Other Ambulatory Visit: Payer: Self-pay

## 2019-04-29 ENCOUNTER — Ambulatory Visit (INDEPENDENT_AMBULATORY_CARE_PROVIDER_SITE_OTHER): Payer: Medicare HMO | Admitting: Internal Medicine

## 2019-04-29 ENCOUNTER — Encounter: Payer: Self-pay | Admitting: Internal Medicine

## 2019-04-29 VITALS — BP 140/76 | HR 53 | Temp 98.5°F | Wt 144.4 lb

## 2019-04-29 DIAGNOSIS — E118 Type 2 diabetes mellitus with unspecified complications: Secondary | ICD-10-CM | POA: Diagnosis not present

## 2019-04-29 DIAGNOSIS — Z0000 Encounter for general adult medical examination without abnormal findings: Secondary | ICD-10-CM | POA: Diagnosis not present

## 2019-04-29 DIAGNOSIS — J439 Emphysema, unspecified: Secondary | ICD-10-CM

## 2019-04-29 DIAGNOSIS — F17201 Nicotine dependence, unspecified, in remission: Secondary | ICD-10-CM | POA: Diagnosis not present

## 2019-04-29 DIAGNOSIS — E1169 Type 2 diabetes mellitus with other specified complication: Secondary | ICD-10-CM | POA: Diagnosis not present

## 2019-04-29 DIAGNOSIS — Z114 Encounter for screening for human immunodeficiency virus [HIV]: Secondary | ICD-10-CM

## 2019-04-29 DIAGNOSIS — R1310 Dysphagia, unspecified: Secondary | ICD-10-CM

## 2019-04-29 DIAGNOSIS — G114 Hereditary spastic paraplegia: Secondary | ICD-10-CM

## 2019-04-29 DIAGNOSIS — F39 Unspecified mood [affective] disorder: Secondary | ICD-10-CM

## 2019-04-29 DIAGNOSIS — E785 Hyperlipidemia, unspecified: Secondary | ICD-10-CM

## 2019-04-29 DIAGNOSIS — R69 Illness, unspecified: Secondary | ICD-10-CM | POA: Diagnosis not present

## 2019-04-29 DIAGNOSIS — Z1231 Encounter for screening mammogram for malignant neoplasm of breast: Secondary | ICD-10-CM | POA: Diagnosis not present

## 2019-04-29 HISTORY — DX: Unspecified mood (affective) disorder: F39

## 2019-04-29 LAB — POCT URINALYSIS DIPSTICK
Bilirubin, UA: NEGATIVE
Blood, UA: NEGATIVE
Glucose, UA: NEGATIVE
Ketones, UA: NEGATIVE
Nitrite, UA: NEGATIVE
Protein, UA: NEGATIVE
Spec Grav, UA: <=1.005 — AB (ref 1.010–1.025)
Urobilinogen, UA: 0.2 E.U./dL
pH, UA: 5 (ref 5.0–8.0)

## 2019-04-29 LAB — MICROALBUMIN, URINE: Microalb, Ur: NORMAL

## 2019-04-29 NOTE — Progress Notes (Signed)
Date:  04/29/2019   Name:  Hannah [name]   DOB:  05/05/1955   MRN:  [id number]   Chief Complaint: Annual Exam and Medication Problem (Patient states that she has changed the dose of gabapentin and metformin) ENA DEMARY is a 64 y.o. female who presents today for her Complete Annual Exam. She feels well. She reports exercising none. She reports she is sleeping fairly well. She has significantly changed her diet and found that decreasing gabapentin has helped her blood sugar.  She denies breast issues.  She will schedule mammogram at [facility].  Mammogram  03/2017 Pap  02/2017 negative with cotesting Colonoscopy  02/2016  Immunization History  Administered Date(s) Administered  . Influenza, Seasonal, Injecte, Preservative Fre 11/29/2011  . Influenza,inj,Quad PF,6+ Mos 11/24/2014, 11/25/2015, 12/24/2016, 12/11/2017, 10/29/2018  . Influenza,inj,quad, With Preservative 03/08/2017  . Pneumococcal Conjugate-13 11/24/2014  . Pneumococcal Polysaccharide-23 05/20/2012   Diabetes She presents for her follow-up diabetic visit. She has type 2 diabetes mellitus. Her disease course has been worsening. Pertinent negatives for hypoglycemia include no dizziness, headaches, nervousness/anxiousness or tremors. Associated symptoms include weakness. Pertinent negatives for diabetes include no chest pain, no fatigue, no polydipsia and no polyuria. Current diabetic treatment includes oral agent (dual therapy) (metformin and januvia; steglatro samples given last visit). She is compliant with treatment most of the time. Her weight is stable. She is following a generally healthy diet. She monitors blood glucose at home 1-2 x per day. Her breakfast blood glucose is taken between 7-8 am. Her breakfast blood glucose range is generally 90-110 mg/dl. An ACE inhibitor/angiotensin II receptor blocker is not being taken. Eye exam is not current.  Hyperlipidemia This is a chronic problem. The problem is controlled. Pertinent  negatives include no chest pain or shortness of breath. Current antihyperlipidemic treatment includes statins. The current treatment provides significant improvement of lipids. Compliance problems: not taking medication.    She has completely changed her medication regimen without MD advise.  She stopped all her DM medications and significantly changed her diet.  She also tapered gabapentin down to one per day.  She stopped taking lipitor for unclear reasons.  Lab Results  Component Value Date   CREATININE 0.57 01/28/2019   BUN 13 01/28/2019   NA 140 01/28/2019   K 4.6 01/28/2019   CL 103 01/28/2019   CO2 23 01/28/2019   Lab Results  Component Value Date   CHOL 223 (H) 10/29/2018   HDL 64 10/29/2018   LDLCALC 136 (H) 10/29/2018   TRIG 132 10/29/2018   CHOLHDL 3.5 10/29/2018   Lab Results  Component Value Date   TSH 1.440 04/25/2018   Lab Results  Component Value Date   HGBA1C 10.2 (H) 01/28/2019     Review of Systems  Constitutional: Negative for chills, fatigue and fever.  HENT: Negative for congestion, hearing loss, tinnitus, trouble swallowing and voice change.   Eyes: Negative for visual disturbance.  Respiratory: Negative for cough, chest tightness, shortness of breath and wheezing.   Cardiovascular: Negative for chest pain, palpitations and leg swelling.  Gastrointestinal: Negative for abdominal pain, constipation, diarrhea and vomiting.  Endocrine: Negative for polydipsia and polyuria.  Genitourinary: Negative for dysuria, frequency, genital sores, vaginal bleeding and vaginal discharge.  Musculoskeletal: Positive for arthralgias and gait problem. Negative for joint swelling.  Skin: Negative for color change and rash.  Neurological: Positive for weakness. Negative for dizziness, tremors, light-headedness and headaches.  Hematological: Negative for adenopathy. Does not bruise/bleed easily.  Psychiatric/Behavioral: Negative  for dysphoric mood and sleep disturbance.  The patient is not nervous/anxious.     Patient Active Problem List   Diagnosis Date Noted  . Mood disorder ([place]) 04/29/2019  . 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 Special screening for malignant neoplasms, colon   . Benign neoplasm of descending colon   . Other osteoporosis without current pathological fracture 11/25/2015  . Hyperlipidemia associated with type 2 diabetes mellitus ([place]) 03/28/2015  . Dysphagia 11/11/2014  . 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 Type II diabetes mellitus with complication ([place]) 44/31/5400  . 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 Tobacco use disorder, mild, 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  . Degenerative joint disease involving multiple joints 09/28/2013    Allergies  Allergen Reactions  . Levofloxacin Nausea And Vomiting    Past Surgical History:  Procedure Laterality Date  . COLONOSCOPY WITH PROPOFOL N/A 03/05/2016   Procedure: COLONOSCOPY WITH PROPOFOL;  Surgeon: [name], MD;  Location: [place];  Service: Endoscopy;  Laterality: N/A;  . HIP FRACTURE SURGERY Right 03/2013  . INNER EAR SURGERY Bilateral   . REPLACEMENT TOTAL KNEE Right 02/2015  . SHOULDER SURGERY Bilateral    rotator cuff    Social History   Tobacco Use  . Smoking status: Former Smoker    Packs/day: 0.50    Years: 20.00    Pack years: 10.00    Types: Cigarettes    Quit date: 03/05/2016    Years since quitting: 3.1  . Smokeless tobacco: Never Used  Substance Use Topics  . Alcohol use: No  . Drug use: No     Medication list has been reviewed and updated.  Current Meds  Medication Sig  . albuterol (VENTOLIN HFA) 108 (90 Base) MCG/ACT inhaler INHALE 2 PUFFS BY MOUTH INTO THE LUNGS EVERY 6 HOURS AS NEEDED FOR WHEEZING OR SHORTNESS OF BREATH  . ALPRAZolam (XANAX) 0.5 MG tablet Take 0.5 mg by mouth 3 (three) times daily as needed.  Marland Kitchen atorvastatin (LIPITOR) 20 MG tablet TAKE 1  TABLET(20 MG) BY MOUTH AT BEDTIME  . Blood Glucose Monitoring Suppl (ONETOUCH VERIO) w/Device KIT 1 each by Does not apply route 2 (two) times daily. Test BS twice daily.  . diclofenac (FLECTOR) 1.3 % PTCH Flector 1.3 % transdermal 12 hour patch  . diclofenac sodium (VOLTAREN) 1 % GEL APP 4 GRAMS EXT AA QID UTD  . fluticasone (FLONASE) 50 MCG/ACT nasal spray Place 2 sprays into both nostrils daily.  . Fluticasone-Salmeterol (ADVAIR DISKUS) 250-50 MCG/DOSE AEPB Inhale 1 puff into the lungs 2 (two) times daily.  Marland Kitchen gabapentin (NEURONTIN) 600 MG tablet Take 1 tablet (600 mg total) by mouth 3 (three) times daily.  Marland Kitchen glucose blood test strip Test BS twice daily.  . metFORMIN (GLUCOPHAGE-XR) 500 MG 24 hr tablet Take 1 tablet (500 mg total) by mouth 2 (two) times daily. 2 in the AM and 1 in PM  . omeprazole (PRILOSEC) 20 MG capsule TAKE 1 CAPSULE BY MOUTH EVERY DAY  . oxyCODONE-acetaminophen (PERCOCET) 10-325 MG tablet TK 1 T PO Q 4 HOURS PRN FOR PAIN. MAX OF 4 TS PER DAY  . pramipexole (MIRAPEX) 0.125 MG tablet Mirapex 0.125 mg tablet  1 to 2 in pm for RLS  . tiZANidine (ZANAFLEX) 4 MG tablet Take 4 mg by mouth every 6 (six) hours as needed.     PHQ 2/9 Scores 04/29/2019 02/16/2019 12/29/2018 10/29/2018  PHQ - 2 Score 0 0 2 1  PHQ- 9  Score - '1 6 11    ' BP Readings from Last 3 Encounters:  04/29/19 140/76  02/16/19 128/78  01/28/19 116/68    Physical Exam Vitals and nursing note reviewed.  Constitutional:      General: She is not in acute distress.    Appearance: She is well-developed.  HENT:     Head: Normocephalic and atraumatic.     Right Ear: Tympanic membrane and ear canal normal.     Left Ear: Tympanic membrane and ear canal normal.     Nose:     Right Sinus: No maxillary sinus tenderness.     Left Sinus: No maxillary sinus tenderness.  Eyes:     General: No scleral icterus.       Right eye: No discharge.        Left eye: No discharge.     Conjunctiva/sclera: Conjunctivae normal.   Neck:     Thyroid: No thyromegaly.     Vascular: No carotid bruit.  Cardiovascular:     Rate and Rhythm: Normal rate and regular rhythm.     Pulses: Normal pulses.     Heart sounds: Normal heart sounds.  Pulmonary:     Effort: Pulmonary effort is normal. No respiratory distress.     Breath sounds: No wheezing.  Abdominal:     General: Bowel sounds are normal.     Palpations: Abdomen is soft.     Tenderness: There is no abdominal tenderness.  Musculoskeletal:     Cervical back: Normal range of motion. No erythema.     Right lower leg: No edema.     Left lower leg: No edema.  Lymphadenopathy:     Cervical: No cervical adenopathy.  Skin:    General: Skin is warm and dry.     Findings: No rash.  Neurological:     Mental Status: She is alert and oriented to person, place, and time. Mental status is at baseline.     Cranial Nerves: No cranial nerve deficit.     Sensory: No sensory deficit.     Motor: Weakness and atrophy present.     Gait: Gait abnormal.     Deep Tendon Reflexes: Reflexes are normal and symmetric.     Reflex Scores:      Patellar reflexes are 2+ on the right side and 2+ on the left side. Psychiatric:        Attention and Perception: Attention normal.        Mood and Affect: Mood normal.        Speech: Speech normal.        Thought Content: Thought content normal.     Wt Readings from Last 3 Encounters:  04/29/19 144 lb 6 oz (65.5 kg)  02/16/19 149 lb 12.8 oz (67.9 kg)  01/28/19 152 lb (68.9 kg)    BP 140/76   Pulse (!) 53   Temp 98.5 F (36.9 C)   Wt 144 lb 6 oz (65.5 kg)   SpO2 96%   BMI 24.78 kg/m   Assessment and Plan: 1. Annual physical exam Normal exam except for neurological deficits which are stable - POCT urinalysis dipstick  2. Encounter for screening mammogram for breast cancer Breast exam declined - pt will schedule mammogram - MM 3D SCREEN BREAST BILATERAL; Future  3. Type II diabetes mellitus with complication ([place]) Last visit  A1C was very high; stelgatro was added via samples but patient states she could not take it.  She subsequently stopped all meds, changed  her diet and has seen BS decrease to the 100 range. She will resume metformin once per day Will advise on additional changes once A1C returns She is reminded to schedule eye exam - CBC with Differential/Platelet - Comprehensive metabolic panel - Hemoglobin A1c - Microalbumin / creatinine urine ratio - TSH  4. Hyperlipidemia associated with type 2 diabetes mellitus ([place]) Resume lipitor - stopped for unclear reasons - Lipid panel  5. 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 Pulmonary emphysema, unspecified emphysema type ([place]) Remains tobacco free Uses albuterol and Advair as needed  7. Screening for HIV (human immunodeficiency virus) - HIV Antibody (routine testing w rflx)  8. Dysphagia, unspecified type No symptoms currently - taking PPI only as needed  9. Tobacco use disorder, mild, in sustained remission  10. Mood disorder ([place]) Resolved   Partially dictated using Editor, commissioning. Any errors are unintentional.  Halina Maidens, MD [facility]  04/29/2019

## 2019-04-29 NOTE — Patient Instructions (Signed)
Covid Vaccine locations: [employer] Dept. [place]  [email or web address]

## 2019-04-30 LAB — LIPID PANEL
Chol/HDL Ratio: 3.6 ratio (ref 0.0–4.4)
Cholesterol, Total: 228 mg/dL — ABNORMAL HIGH (ref 100–199)
HDL: 64 mg/dL (ref >39)
LDL Chol Calc (NIH): 135 mg/dL — ABNORMAL HIGH (ref 0–99)
Triglycerides: 167 mg/dL — ABNORMAL HIGH (ref 0–149)
VLDL Cholesterol Cal: 29 mg/dL (ref 5–40)

## 2019-04-30 LAB — COMPREHENSIVE METABOLIC PANEL
ALT: 17 IU/L (ref 0–32)
AST: 13 IU/L (ref 0–40)
Albumin/Globulin Ratio: 1.9 (ref 1.2–2.2)
Albumin: 4.4 g/dL (ref 3.8–4.8)
Alkaline Phosphatase: 76 IU/L (ref 39–117)
BUN/Creatinine Ratio: 15 (ref 12–28)
BUN: 8 mg/dL (ref 8–27)
Bilirubin Total: 0.3 mg/dL (ref 0.0–1.2)
CO2: 24 mmol/L (ref 20–29)
Calcium: 9.7 mg/dL (ref 8.7–10.3)
Chloride: 103 mmol/L (ref 96–106)
Creatinine, Ser: 0.55 mg/dL — ABNORMAL LOW (ref 0.57–1.00)
GFR calc Af Amer: 115 mL/min/{1.73_m2} (ref >59)
GFR calc non Af Amer: 100 mL/min/{1.73_m2} (ref >59)
Globulin, Total: 2.3 g/dL (ref 1.5–4.5)
Glucose: 162 mg/dL — ABNORMAL HIGH (ref 65–99)
Potassium: 4.5 mmol/L (ref 3.5–5.2)
Sodium: 139 mmol/L (ref 134–144)
Total Protein: 6.7 g/dL (ref 6.0–8.5)

## 2019-04-30 LAB — CBC WITH DIFFERENTIAL/PLATELET
Basophils Absolute: 0.1 10*3/uL (ref 0.0–0.2)
Basos: 1 %
EOS (ABSOLUTE): 0.1 10*3/uL (ref 0.0–0.4)
Eos: 1 %
Hematocrit: 45 % (ref 34.0–46.6)
Hemoglobin: 15.5 g/dL (ref 11.1–15.9)
Immature Grans (Abs): 0 10*3/uL (ref 0.0–0.1)
Immature Granulocytes: 0 %
Lymphocytes Absolute: 3.9 10*3/uL — ABNORMAL HIGH (ref 0.7–3.1)
Lymphs: 35 %
MCH: 32.8 pg (ref 26.6–33.0)
MCHC: 34.4 g/dL (ref 31.5–35.7)
MCV: 95 fL (ref 79–97)
Monocytes Absolute: 0.7 10*3/uL (ref 0.1–0.9)
Monocytes: 7 %
Neutrophils Absolute: 6.4 10*3/uL (ref 1.4–7.0)
Neutrophils: 56 %
Platelets: 267 10*3/uL (ref 150–450)
RBC: 4.72 x10E6/uL (ref 3.77–5.28)
RDW: 12.1 % (ref 11.7–15.4)
WBC: 11.2 10*3/uL — ABNORMAL HIGH (ref 3.4–10.8)

## 2019-04-30 LAB — TSH: TSH: 1.53 u[IU]/mL (ref 0.450–4.500)

## 2019-04-30 LAB — HEMOGLOBIN A1C
Est. average glucose Bld gHb Est-mCnc: 212 mg/dL
Hgb A1c MFr Bld: 9 % — ABNORMAL HIGH (ref 4.8–5.6)

## 2019-05-01 ENCOUNTER — Encounter: Payer: Self-pay | Admitting: Internal Medicine

## 2019-05-06 ENCOUNTER — Other Ambulatory Visit: Payer: Self-pay

## 2019-05-06 ENCOUNTER — Ambulatory Visit
Admission: RE | Admit: 2019-05-06 | Discharge: 2019-05-06 | Disposition: A | Discharge status: Home / Self Care | Payer: Medicare HMO | Source: Ambulatory Visit | Attending: Internal Medicine | Admitting: Internal Medicine

## 2019-05-06 DIAGNOSIS — Z1231 Encounter for screening mammogram for malignant neoplasm of breast: Secondary | ICD-10-CM | POA: Insufficient documentation

## 2019-05-18 ENCOUNTER — Ambulatory Visit: Payer: Medicare HMO | Attending: Internal Medicine

## 2019-05-18 DIAGNOSIS — Z23 Encounter for immunization: Secondary | ICD-10-CM

## 2019-05-18 NOTE — Progress Notes (Signed)
   Covid-19 Vaccination Clinic  Name:  Hannah [name]    MRN: [id number] DOB: 05-05-1955  05/18/2019  [name] was observed post Covid-19 immunization for 15 minutes without incident. She was provided with Vaccine Information Sheet and instruction to access the V-Safe system.   [name] was instructed to call 911 with any severe reactions post vaccine: Marland Kitchen Difficulty breathing  . Swelling of face and throat  . A fast heartbeat  . A bad rash all over body  . Dizziness and weakness   Immunizations Administered    Name Date Dose VIS Date Route   Pfizer COVID-19 Vaccine 05/18/2019  2:10 PM 0.3 mL 01/30/2019 Intramuscular   Manufacturer: [place]   Lot: [id number]   [place]: [id number]

## 2019-06-10 ENCOUNTER — Ambulatory Visit: Payer: Medicare HMO | Attending: Internal Medicine

## 2019-06-10 DIAGNOSIS — Z23 Encounter for immunization: Secondary | ICD-10-CM

## 2019-06-10 NOTE — Progress Notes (Signed)
   Covid-19 Vaccination Clinic  Name:  Hannah [name]    MRN: [id number] DOB: 09/10/1955  06/10/2019  Hannah [name] was observed post Covid-19 immunization for 15 minutes without incident. She was provided with Vaccine Information Sheet and instruction to access the V-Safe system.   Hannah [name] was instructed to call 911 with any severe reactions post vaccine: Marland Kitchen Difficulty breathing  . Swelling of face and throat  . A fast heartbeat  . A bad rash all over body  . Dizziness and weakness   Immunizations Administered    Name Date Dose VIS Date Route   Pfizer COVID-19 Vaccine 06/10/2019 11:40 AM 0.3 mL 04/15/2018 Intramuscular   Manufacturer: [place]   Lot: [id number]   [place]: [id number]

## 2019-06-18 ENCOUNTER — Telehealth: Payer: Self-pay

## 2019-06-18 NOTE — Patient Outreach (Addendum)
Ms. Roubideaux would like [place] [employer] packet of information sent to her home address.

## 2019-06-19 DIAGNOSIS — Z79899 Other long term (current) drug therapy: Secondary | ICD-10-CM | POA: Diagnosis not present

## 2019-06-19 DIAGNOSIS — M47816 Spondylosis without myelopathy or radiculopathy, lumbar region: Secondary | ICD-10-CM | POA: Diagnosis not present

## 2019-06-19 DIAGNOSIS — Z79891 Long term (current) use of opiate analgesic: Secondary | ICD-10-CM | POA: Diagnosis not present

## 2019-06-19 DIAGNOSIS — G2581 Restless legs syndrome: Secondary | ICD-10-CM | POA: Diagnosis not present

## 2019-06-19 DIAGNOSIS — M25569 Pain in unspecified knee: Secondary | ICD-10-CM | POA: Diagnosis not present

## 2019-06-19 DIAGNOSIS — Z96659 Presence of unspecified artificial knee joint: Secondary | ICD-10-CM | POA: Diagnosis not present

## 2019-06-19 DIAGNOSIS — M6530 Trigger finger, unspecified finger: Secondary | ICD-10-CM | POA: Diagnosis not present

## 2019-06-19 DIAGNOSIS — Z5181 Encounter for therapeutic drug level monitoring: Secondary | ICD-10-CM | POA: Diagnosis not present

## 2019-06-19 DIAGNOSIS — M5416 Radiculopathy, lumbar region: Secondary | ICD-10-CM | POA: Diagnosis not present

## 2019-06-19 DIAGNOSIS — M25559 Pain in unspecified hip: Secondary | ICD-10-CM | POA: Diagnosis not present

## 2019-06-19 DIAGNOSIS — M19049 Primary osteoarthritis, unspecified hand: Secondary | ICD-10-CM | POA: Diagnosis not present

## 2019-06-19 DIAGNOSIS — G894 Chronic pain syndrome: Secondary | ICD-10-CM | POA: Diagnosis not present

## 2019-07-01 ENCOUNTER — Telehealth: Payer: Self-pay | Admitting: Internal Medicine

## 2019-07-01 NOTE — Telephone Encounter (Unsigned)
Copied from [place] [phone]. Topic: General - Inquiry >> Jul 01, 2019  4:20 PM Hannah [name], [place] wrote: Reason for CRM: [facility] called in in regards to patient's metformin 500mg . They are requesting the patient have a quantity enough for 30 days or longer, as current script for patient is only 20. Please advise.

## 2019-07-02 ENCOUNTER — Other Ambulatory Visit: Payer: Self-pay

## 2019-07-02 DIAGNOSIS — E118 Type 2 diabetes mellitus with unspecified complications: Secondary | ICD-10-CM

## 2019-07-02 MED ORDER — METFORMIN HCL ER 500 MG PO TB24: 500.0000 mg | ORAL_TABLET | Freq: Two times a day (BID) | ORAL | 1 refills | Status: DC

## 2019-07-02 NOTE — Telephone Encounter (Signed)
Sent in #[phone] with 1 refill to [facility] in [place].   CM

## 2019-07-30 ENCOUNTER — Encounter: Payer: Self-pay | Admitting: Internal Medicine

## 2019-07-30 ENCOUNTER — Ambulatory Visit (INDEPENDENT_AMBULATORY_CARE_PROVIDER_SITE_OTHER): Payer: Medicare HMO | Admitting: Internal Medicine

## 2019-07-30 ENCOUNTER — Other Ambulatory Visit: Payer: Self-pay

## 2019-07-30 VITALS — BP 102/64 | HR 67 | Temp 98.2°F | Ht 64.0 in | Wt 144.0 lb

## 2019-07-30 DIAGNOSIS — G114 Hereditary spastic paraplegia: Secondary | ICD-10-CM | POA: Diagnosis not present

## 2019-07-30 DIAGNOSIS — E1169 Type 2 diabetes mellitus with other specified complication: Secondary | ICD-10-CM

## 2019-07-30 DIAGNOSIS — J439 Emphysema, unspecified: Secondary | ICD-10-CM | POA: Diagnosis not present

## 2019-07-30 DIAGNOSIS — E785 Hyperlipidemia, unspecified: Secondary | ICD-10-CM | POA: Diagnosis not present

## 2019-07-30 DIAGNOSIS — E118 Type 2 diabetes mellitus with unspecified complications: Secondary | ICD-10-CM | POA: Diagnosis not present

## 2019-07-30 DIAGNOSIS — R69 Illness, unspecified: Secondary | ICD-10-CM | POA: Diagnosis not present

## 2019-07-30 MED ORDER — METFORMIN HCL ER 500 MG PO TB24: 500.0000 mg | ORAL_TABLET | Freq: Three times a day (TID) | ORAL | 1 refills | Status: DC

## 2019-07-30 MED ORDER — GLUCOSE BLOOD VI STRP: ORAL_STRIP | 12 refills | Status: DC

## 2019-07-30 NOTE — Progress Notes (Signed)
Date:  07/30/2019   Name:  Hannah [name]   DOB:  June 04, 1955   MRN:  [id number]   Chief Complaint: Diabetes (last reading 197/ foot exam/follow up / not taking junuvia taking metformin)  Diabetes She presents for her follow-up diabetic visit. She has type 2 diabetes mellitus. Her disease course has been improving. Pertinent negatives for hypoglycemia include no headaches or tremors. Pertinent negatives for diabetes include no chest pain, no fatigue, no polydipsia and no polyuria. Her weight is stable. She is following a generally healthy diet. When asked about meal planning, she reported none. She monitors blood glucose at home 1-2 x per day. Her breakfast blood glucose range is generally 140-180 mg/dl. Her dinner blood glucose range is generally 180-200 mg/dl. An ACE inhibitor/angiotensin II receptor blocker is not being taken. Eye exam is not current.  Hyperlipidemia This is a chronic problem. The problem is uncontrolled (had not been taking medications at last visit). Exacerbating diseases include diabetes. Pertinent negatives include no chest pain or shortness of breath.    Lab Results  Component Value Date   CREATININE 0.55 (L) 04/29/2019   BUN 8 04/29/2019   NA 139 04/29/2019   K 4.5 04/29/2019   CL 103 04/29/2019   CO2 24 04/29/2019   Lab Results  Component Value Date   CHOL 228 (H) 04/29/2019   HDL 64 04/29/2019   LDLCALC 135 (H) 04/29/2019   TRIG 167 (H) 04/29/2019   CHOLHDL 3.6 04/29/2019   Lab Results  Component Value Date   TSH 1.530 04/29/2019   Lab Results  Component Value Date   HGBA1C 9.0 (H) 04/29/2019   Lab Results  Component Value Date   WBC 11.2 (H) 04/29/2019   HGB 15.5 04/29/2019   HCT 45.0 04/29/2019   MCV 95 04/29/2019   PLT 267 04/29/2019   Lab Results  Component Value Date   ALT 17 04/29/2019   AST 13 04/29/2019   ALKPHOS 76 04/29/2019   BILITOT 0.3 04/29/2019     Review of Systems  Constitutional: Negative for appetite change,  fatigue, fever and unexpected weight change.  HENT: Negative for tinnitus and trouble swallowing.   Eyes: Negative for visual disturbance.  Respiratory: Negative for cough, chest tightness and shortness of breath.   Cardiovascular: Negative for chest pain, palpitations and leg swelling.  Gastrointestinal: Negative for abdominal pain.  Endocrine: Negative for polydipsia and polyuria.  Genitourinary: Negative for dysuria and hematuria.  Musculoskeletal: Negative for arthralgias.  Neurological: Negative for tremors, numbness and headaches.  Psychiatric/Behavioral: Negative for dysphoric mood.    Patient Active Problem List   Diagnosis Date Noted  . 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 Special screening for malignant neoplasms, colon   . Benign neoplasm of descending colon   . Other osteoporosis without current pathological fracture 11/25/2015  . Hyperlipidemia associated with type 2 diabetes mellitus ([place]) 03/28/2015  . Dysphagia 11/11/2014  . 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 Type II diabetes mellitus with complication ([place]) 01/00/7121  . 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 Tobacco use disorder, mild, 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  . Degenerative joint disease involving multiple joints 09/28/2013    Allergies  Allergen Reactions  . Levofloxacin Nausea And Vomiting    Past Surgical History:  Procedure Laterality Date  . COLONOSCOPY WITH PROPOFOL N/A 03/05/2016   Procedure: COLONOSCOPY WITH PROPOFOL;  Surgeon: [name], MD;  Location: [facility];  Service: Endoscopy;  Laterality: N/A;  .  HIP FRACTURE SURGERY Right 03/2013  . INNER EAR SURGERY Bilateral   . REPLACEMENT TOTAL KNEE Right 02/2015  . SHOULDER SURGERY Bilateral    rotator cuff    Social History   Tobacco Use  . Smoking status: Former Smoker    Packs/day: 0.50    Years: 20.00    Pack years: 10.00    Types:  Cigarettes    Quit date: 03/05/2016    Years since quitting: 3.4  . Smokeless tobacco: Never Used  Vaping Use  . Vaping Use: Never used  Substance Use Topics  . Alcohol use: No  . Drug use: No     Medication list has been reviewed and updated.  Current Meds  Medication Sig  . albuterol (VENTOLIN HFA) 108 (90 Base) MCG/ACT inhaler INHALE 2 PUFFS BY MOUTH INTO THE LUNGS EVERY 6 HOURS AS NEEDED FOR WHEEZING OR SHORTNESS OF BREATH  . ALPRAZolam (XANAX) 0.5 MG tablet Take 0.5 mg by mouth 3 (three) times daily as needed.  Marland Kitchen atorvastatin (LIPITOR) 20 MG tablet TAKE 1 TABLET(20 MG) BY MOUTH AT BEDTIME  . Blood Glucose Monitoring Suppl (ONETOUCH VERIO) w/Device KIT 1 each by Does not apply route 2 (two) times daily. Test BS twice daily.  . diclofenac (FLECTOR) 1.3 % PTCH Flector 1.3 % transdermal 12 hour patch  . diclofenac sodium (VOLTAREN) 1 % GEL APP 4 GRAMS EXT AA QID UTD  . fluticasone (FLONASE) 50 MCG/ACT nasal spray Place 2 sprays into both nostrils daily.  . Fluticasone-Salmeterol (ADVAIR DISKUS) 250-50 MCG/DOSE AEPB Inhale 1 puff into the lungs 2 (two) times daily.  Marland Kitchen gabapentin (NEURONTIN) 600 MG tablet Take 1 tablet (600 mg total) by mouth 3 (three) times daily.  Marland Kitchen glucose blood (CONTOUR NEXT TEST) test strip   . glucose blood test strip Test BS twice daily.  . metFORMIN (GLUCOPHAGE-XR) 500 MG 24 hr tablet Take 1 tablet (500 mg total) by mouth 2 (two) times daily. 2 in the AM and 1 in PM (Patient taking differently: Take 500 mg by mouth in the morning, at noon, and at bedtime. 2 in the AM and 1 in PM)  . omeprazole (PRILOSEC) 20 MG capsule TAKE 1 CAPSULE BY MOUTH EVERY DAY  . oxyCODONE-acetaminophen (PERCOCET) 10-325 MG tablet TK 1 T PO Q 4 HOURS PRN FOR PAIN. MAX OF 4 TS PER DAY  . pramipexole (MIRAPEX) 0.125 MG tablet Mirapex 0.125 mg tablet  1 to 2 in pm for RLS  . tiZANidine (ZANAFLEX) 4 MG tablet Take 4 mg by mouth every 6 (six) hours as needed.     PHQ 2/9 Scores 07/30/2019  04/29/2019 02/16/2019 12/29/2018  PHQ - 2 Score 0 0 0 2  PHQ- 9 Score 0 - 1 6    BP Readings from Last 3 Encounters:  07/30/19 102/64  04/29/19 140/76  02/16/19 128/78    Physical Exam Vitals and nursing note reviewed.  Constitutional:      General: She is not in acute distress.    Appearance: Normal appearance. She is well-developed.  HENT:     Head: Normocephalic and atraumatic.  Cardiovascular:     Rate and Rhythm: Normal rate and regular rhythm.     Pulses: Normal pulses.     Heart sounds: Normal heart sounds.  Pulmonary:     Effort: Pulmonary effort is normal. No respiratory distress.     Breath sounds: Decreased breath sounds present. No wheezing or rhonchi.  Musculoskeletal:     Cervical back: Normal range of motion  and neck supple.  Skin:    General: Skin is warm and dry.     Capillary Refill: Capillary refill takes less than 2 seconds.     Findings: No rash.  Neurological:     General: No focal deficit present.     Mental Status: She is alert and oriented to person, place, and time.     Gait: Gait abnormal.     Comments: Antalgic with rolator for support  Psychiatric:        Attention and Perception: Attention normal.        Mood and Affect: Mood normal.     Wt Readings from Last 3 Encounters:  07/30/19 144 lb (65.3 kg)  04/29/19 144 lb 6 oz (65.5 kg)  02/16/19 149 lb 12.8 oz (67.9 kg)    BP 102/64   Pulse 67   Temp 98.2 F (36.8 C) (Oral)   Ht '5\' 4"'  (1.626 m)   Wt 144 lb (65.3 kg)   SpO2 95%   BMI 24.72 kg/m   Assessment and Plan: 1. Type II diabetes mellitus with complication (HCC) Clinically stable by exam and report without s/s of hypoglycemia. DM complicated by lipids. Now on metformin 1500 mg per day consistently. Tolerating medications well without side effects or other concerns. Reminded to schedule a DM eye exam - Hemoglobin A1c - metFORMIN (GLUCOPHAGE-XR) 500 MG 24 hr tablet; Take 1 tablet (500 mg total) by mouth in the morning, at  noon, and at bedtime. 2 in the AM and 1 in PM  Dispense: 270 tablet; Refill: 1 - glucose blood test strip; Test BS twice daily.  Dispense: 100 each; Refill: 12  2. Hyperlipidemia associated with type 2 diabetes mellitus ([place]) Lipids were not controlled at last visit but now taking lipitor daily without side effects Will check labs and advise - Lipid panel  3. Pulmonary emphysema, unspecified emphysema type (HCC) Chronic stable SOB Using Advair bid and albuterol MDI as needed No recent exacerbations or need for antibiotics  4. 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   Partially dictated using Editor, commissioning. Any errors are unintentional.  Halina Maidens, MD [facility]  07/30/2019

## 2019-07-30 NOTE — Patient Instructions (Signed)
Please schedule your DM eye exam

## 2019-07-31 LAB — LIPID PANEL
Chol/HDL Ratio: 3.9 ratio (ref 0.0–4.4)
Cholesterol, Total: 226 mg/dL — ABNORMAL HIGH (ref 100–199)
HDL: 58 mg/dL (ref >39)
LDL Chol Calc (NIH): 145 mg/dL — ABNORMAL HIGH (ref 0–99)
Triglycerides: 128 mg/dL (ref 0–149)
VLDL Cholesterol Cal: 23 mg/dL (ref 5–40)

## 2019-07-31 LAB — HEMOGLOBIN A1C
Est. average glucose Bld gHb Est-mCnc: 194 mg/dL
Hgb A1c MFr Bld: 8.4 % — ABNORMAL HIGH (ref 4.8–5.6)

## 2019-09-10 DIAGNOSIS — Z79899 Other long term (current) drug therapy: Secondary | ICD-10-CM | POA: Diagnosis not present

## 2019-09-10 DIAGNOSIS — M25559 Pain in unspecified hip: Secondary | ICD-10-CM | POA: Diagnosis not present

## 2019-09-10 DIAGNOSIS — M19049 Primary osteoarthritis, unspecified hand: Secondary | ICD-10-CM | POA: Diagnosis not present

## 2019-09-10 DIAGNOSIS — Z96659 Presence of unspecified artificial knee joint: Secondary | ICD-10-CM | POA: Diagnosis not present

## 2019-09-10 DIAGNOSIS — M6530 Trigger finger, unspecified finger: Secondary | ICD-10-CM | POA: Diagnosis not present

## 2019-09-10 DIAGNOSIS — M25569 Pain in unspecified knee: Secondary | ICD-10-CM | POA: Diagnosis not present

## 2019-09-10 DIAGNOSIS — M47816 Spondylosis without myelopathy or radiculopathy, lumbar region: Secondary | ICD-10-CM | POA: Diagnosis not present

## 2019-09-10 DIAGNOSIS — G2581 Restless legs syndrome: Secondary | ICD-10-CM | POA: Diagnosis not present

## 2019-09-10 DIAGNOSIS — G894 Chronic pain syndrome: Secondary | ICD-10-CM | POA: Diagnosis not present

## 2019-09-10 DIAGNOSIS — M5416 Radiculopathy, lumbar region: Secondary | ICD-10-CM | POA: Diagnosis not present

## 2019-09-28 ENCOUNTER — Other Ambulatory Visit: Payer: Self-pay | Admitting: Internal Medicine

## 2019-09-28 DIAGNOSIS — J439 Emphysema, unspecified: Secondary | ICD-10-CM

## 2019-09-28 NOTE — Telephone Encounter (Signed)
Requested medication (s) are due for refill today: n/a  Requested medication (s) are on the active medication list: No  Last refill:  11/19/17  Future visit scheduled: Yes  Notes to clinic:  Medication is not on pt. Medication list.    Requested Prescriptions  Pending Prescriptions Disp Refills   WIXELA INHUB 250-50 MCG/DOSE AEPB [Pharmacy Med Name: WIXELA INHUB DISKUS 250/50MCG 60S] 60 each 5    Sig: INHALE 1 PUFF INTO THE LUNGS TWICE DAILY      Pulmonology:  Combination Products Passed - 09/28/2019  1:42 PM      Passed - Valid encounter within last 12 months    Recent Outpatient Visits           2 months ago Type II diabetes mellitus with complication [facility])   [facility] Glean Hess, MD   5 months ago Annual physical exam   [facility] Glean Hess, MD   7 months ago Type II diabetes mellitus with complication [facility])   [facility] Glean Hess, MD   8 months ago Yeast vaginitis   [facility] Glean Hess, MD   11 months ago Type II diabetes mellitus with complication [facility])   [facility] Glean Hess, MD       Future Appointments             In 1 month Army Melia Jesse Sans, MD [facility], [place]   In 7 months Army Melia, Jesse Sans, MD [facility]

## 2019-09-29 ENCOUNTER — Other Ambulatory Visit: Payer: Self-pay | Admitting: Internal Medicine

## 2019-09-29 DIAGNOSIS — J439 Emphysema, unspecified: Secondary | ICD-10-CM

## 2019-09-29 MED ORDER — FLUTICASONE-SALMETEROL 250-50 MCG/DOSE IN AEPB: 1.0000 | INHALATION_SPRAY | Freq: Two times a day (BID) | RESPIRATORY_TRACT | 5 refills | Status: DC

## 2019-10-15 DIAGNOSIS — M5416 Radiculopathy, lumbar region: Secondary | ICD-10-CM | POA: Diagnosis not present

## 2019-10-28 DIAGNOSIS — Z96659 Presence of unspecified artificial knee joint: Secondary | ICD-10-CM | POA: Diagnosis not present

## 2019-10-28 DIAGNOSIS — G894 Chronic pain syndrome: Secondary | ICD-10-CM | POA: Diagnosis not present

## 2019-10-28 DIAGNOSIS — M6530 Trigger finger, unspecified finger: Secondary | ICD-10-CM | POA: Diagnosis not present

## 2019-10-28 DIAGNOSIS — Z79899 Other long term (current) drug therapy: Secondary | ICD-10-CM | POA: Diagnosis not present

## 2019-10-28 DIAGNOSIS — M25569 Pain in unspecified knee: Secondary | ICD-10-CM | POA: Diagnosis not present

## 2019-10-28 DIAGNOSIS — M47816 Spondylosis without myelopathy or radiculopathy, lumbar region: Secondary | ICD-10-CM | POA: Diagnosis not present

## 2019-10-28 DIAGNOSIS — G2581 Restless legs syndrome: Secondary | ICD-10-CM | POA: Diagnosis not present

## 2019-10-28 DIAGNOSIS — M5416 Radiculopathy, lumbar region: Secondary | ICD-10-CM | POA: Diagnosis not present

## 2019-10-28 DIAGNOSIS — M19049 Primary osteoarthritis, unspecified hand: Secondary | ICD-10-CM | POA: Diagnosis not present

## 2019-10-28 DIAGNOSIS — M25559 Pain in unspecified hip: Secondary | ICD-10-CM | POA: Diagnosis not present

## 2019-10-31 DIAGNOSIS — M47814 Spondylosis without myelopathy or radiculopathy, thoracic region: Secondary | ICD-10-CM | POA: Diagnosis not present

## 2019-11-25 ENCOUNTER — Encounter: Payer: Self-pay | Admitting: Internal Medicine

## 2019-11-25 ENCOUNTER — Ambulatory Visit (INDEPENDENT_AMBULATORY_CARE_PROVIDER_SITE_OTHER): Payer: Medicare HMO | Admitting: Internal Medicine

## 2019-11-25 ENCOUNTER — Other Ambulatory Visit: Payer: Self-pay

## 2019-11-25 VITALS — BP 118/62 | HR 57 | Ht 64.0 in | Wt 140.0 lb

## 2019-11-25 DIAGNOSIS — E785 Hyperlipidemia, unspecified: Secondary | ICD-10-CM

## 2019-11-25 DIAGNOSIS — S65511A Laceration of blood vessel of left index finger, initial encounter: Secondary | ICD-10-CM | POA: Diagnosis not present

## 2019-11-25 DIAGNOSIS — Z23 Encounter for immunization: Secondary | ICD-10-CM

## 2019-11-25 DIAGNOSIS — E1169 Type 2 diabetes mellitus with other specified complication: Secondary | ICD-10-CM

## 2019-11-25 DIAGNOSIS — E118 Type 2 diabetes mellitus with unspecified complications: Secondary | ICD-10-CM | POA: Diagnosis not present

## 2019-11-25 DIAGNOSIS — R1310 Dysphagia, unspecified: Secondary | ICD-10-CM

## 2019-11-25 MED ORDER — ATORVASTATIN CALCIUM 20 MG PO TABS: 20.0000 mg | ORAL_TABLET | Freq: Every day | ORAL | 5 refills | Status: DC

## 2019-11-25 MED ORDER — OMEPRAZOLE 20 MG PO CPDR: DELAYED_RELEASE_CAPSULE | ORAL | 5 refills | Status: DC

## 2019-11-25 NOTE — Progress Notes (Signed)
Date:  11/25/2019   Name:  Hannah [name]   DOB:  1955-06-09   MRN:  [id number]   Chief Complaint: Diabetes (4 month follow up. ) and Flu Vaccine (Reg dose. ) Laceration - of left index finger by a hedgetrimmer on 11/21/19 at home. Diabetes She presents for her follow-up diabetic visit. She has type 2 diabetes mellitus. Her disease course has been stable. Pertinent negatives for hypoglycemia include no headaches or tremors. Associated symptoms include weakness. Pertinent negatives for diabetes include no chest pain, no fatigue, no polydipsia and no polyuria. Current diabetic treatment includes oral agent (monotherapy). She is compliant with treatment all of the time. She is following a generally healthy diet. She monitors blood glucose at home 3-4 x per week. Her home blood glucose trend is decreasing steadily. Her breakfast blood glucose range is generally 140-180 mg/dl. An ACE inhibitor/angiotensin II receptor blocker is not being taken. Eye exam is not current.  Hyperlipidemia This is a chronic problem. Pertinent negatives include no chest pain or shortness of breath. Current antihyperlipidemic treatment includes statins. The current treatment provides mild improvement of lipids.  Gastroesophageal Reflux She complains of dysphagia. She reports no abdominal pain, no chest pain, no coughing, no heartburn, no sore throat or no wheezing. Pertinent negatives include no fatigue. She has tried a PPI for the symptoms. The treatment provided significant relief.  Eye exam is overdue.  Immunization History  Administered Date(s) Administered  . Influenza, Seasonal, Injecte, Preservative Fre 11/29/2011  . Influenza,inj,Quad PF,6+ Mos 11/24/2014, 11/25/2015, 12/24/2016, 12/11/2017, 10/29/2018  . Influenza,inj,quad, With Preservative 03/08/2017  . PFIZER SARS-COV-2 Vaccination 05/18/2019, 06/10/2019  . Pneumococcal Conjugate-13 11/24/2014  . Pneumococcal Polysaccharide-23 05/20/2012    Lab Results    Component Value Date   CREATININE 0.55 (L) 04/29/2019   BUN 8 04/29/2019   NA 139 04/29/2019   K 4.5 04/29/2019   CL 103 04/29/2019   CO2 24 04/29/2019   Lab Results  Component Value Date   CHOL 226 (H) 07/30/2019   HDL 58 07/30/2019   LDLCALC 145 (H) 07/30/2019   TRIG 128 07/30/2019   CHOLHDL 3.9 07/30/2019   Lab Results  Component Value Date   TSH 1.530 04/29/2019   Lab Results  Component Value Date   HGBA1C 8.4 (H) 07/30/2019   Lab Results  Component Value Date   WBC 11.2 (H) 04/29/2019   HGB 15.5 04/29/2019   HCT 45.0 04/29/2019   MCV 95 04/29/2019   PLT 267 04/29/2019   Lab Results  Component Value Date   ALT 17 04/29/2019   AST 13 04/29/2019   ALKPHOS 76 04/29/2019   BILITOT 0.3 04/29/2019     Review of Systems  Constitutional: Negative for appetite change, fatigue, fever and unexpected weight change.  HENT: Positive for trouble swallowing. Negative for sore throat.   Eyes: Negative for visual disturbance.  Respiratory: Negative for cough, chest tightness, shortness of breath and wheezing.   Cardiovascular: Negative for chest pain, palpitations and leg swelling.  Gastrointestinal: Positive for dysphagia. Negative for abdominal pain and heartburn.  Endocrine: Negative for polydipsia and polyuria.  Genitourinary: Negative for dysuria and hematuria.  Musculoskeletal: Positive for arthralgias, back pain and gait problem.  Neurological: Positive for weakness and numbness. Negative for tremors and headaches.  Psychiatric/Behavioral: Negative for dysphoric mood.    Patient Active Problem List   Diagnosis Date Noted  . 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 Special screening for malignant neoplasms, colon   . Benign neoplasm of descending  colon   . Other osteoporosis without current pathological fracture 11/25/2015  . Hyperlipidemia associated with type 2 diabetes mellitus ([place]) 03/28/2015  . Dysphagia 11/11/2014  . Autosomal dominant hereditary  spastic paraplegia ([place]) 11/11/2014  . Type II diabetes mellitus with complication ([place]) 16/11/9602  . 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 Tobacco use disorder, mild, 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  . Degenerative joint disease involving multiple joints 09/28/2013    Allergies  Allergen Reactions  . Levofloxacin Nausea And Vomiting    Past Surgical History:  Procedure Laterality Date  . COLONOSCOPY WITH PROPOFOL N/A 03/05/2016   Procedure: COLONOSCOPY WITH PROPOFOL;  Surgeon: [name], MD;  Location: [facility];  Service: Endoscopy;  Laterality: N/A;  . HIP FRACTURE SURGERY Right 03/2013  . INNER EAR SURGERY Bilateral   . REPLACEMENT TOTAL KNEE Right 02/2015  . SHOULDER SURGERY Bilateral    rotator cuff    Social History   Tobacco Use  . Smoking status: Former Smoker    Packs/day: 0.50    Years: 20.00    Pack years: 10.00    Types: Cigarettes    Quit date: 03/05/2016    Years since quitting: 3.7  . Smokeless tobacco: Never Used  Vaping Use  . Vaping Use: Never used  Substance Use Topics  . Alcohol use: No  . Drug use: No     Medication list has been reviewed and updated.  Current Meds  Medication Sig  . albuterol (VENTOLIN HFA) 108 (90 Base) MCG/ACT inhaler INHALE 2 PUFFS BY MOUTH INTO THE LUNGS EVERY 6 HOURS AS NEEDED FOR WHEEZING OR SHORTNESS OF BREATH  . ALPRAZolam (XANAX) 0.5 MG tablet Take 0.5 mg by mouth 3 (three) times daily as needed.  Marland Kitchen atorvastatin (LIPITOR) 20 MG tablet TAKE 1 TABLET(20 MG) BY MOUTH AT BEDTIME  . Blood Glucose Monitoring Suppl (ONETOUCH VERIO) w/Device KIT 1 each by Does not apply route 2 (two) times daily. Test BS twice daily.  . diclofenac (FLECTOR) 1.3 % PTCH Flector 1.3 % transdermal 12 hour patch  . diclofenac sodium (VOLTAREN) 1 % GEL APP 4 GRAMS EXT AA QID UTD  . fluticasone (FLONASE) 50 MCG/ACT nasal spray Place 2 sprays into both nostrils daily.  . Fluticasone-Salmeterol  (ADVAIR DISKUS) 250-50 MCG/DOSE AEPB Inhale 1 puff into the lungs 2 (two) times daily.  Marland Kitchen gabapentin (NEURONTIN) 600 MG tablet Take 1 tablet (600 mg total) by mouth 3 (three) times daily.  Marland Kitchen glucose blood test strip Test BS twice daily.  . metFORMIN (GLUCOPHAGE-XR) 500 MG 24 hr tablet Take 1 tablet (500 mg total) by mouth in the morning, at noon, and at bedtime. 2 in the AM and 1 in PM (Patient taking differently: Take 500 mg by mouth in the morning, at noon, in the evening, and at bedtime. 2 in the AM and 2 in PM)  . omeprazole (PRILOSEC) 20 MG capsule TAKE 1 CAPSULE BY MOUTH EVERY DAY  . oxyCODONE-acetaminophen (PERCOCET) 10-325 MG tablet TK 1 T PO Q 4 HOURS PRN FOR PAIN. MAX OF 4 TS PER DAY  . pramipexole (MIRAPEX) 0.125 MG tablet Mirapex 0.125 mg tablet  1 to 2 in pm for RLS  . tiZANidine (ZANAFLEX) 4 MG tablet Take 4 mg by mouth every 6 (six) hours as needed.     PHQ 2/9 Scores 11/25/2019 07/30/2019 04/29/2019 02/16/2019  PHQ - 2 Score 1 0 0 0  PHQ- 9 Score 2 0 - 1    GAD 7 :  Generalized Anxiety Score 11/25/2019 07/30/2019  Nervous, Anxious, on Edge 2 0  Control/stop worrying 0 0  Worry too much - different things 0 0  Trouble relaxing 0 1  Restless 0 0  Easily annoyed or irritable 0 0  Afraid - awful might happen 0 0  Total GAD 7 Score 2 1  Anxiety Difficulty Not difficult at all Not difficult at all    BP Readings from Last 3 Encounters:  11/25/19 118/62  07/30/19 102/64  04/29/19 140/76    Physical Exam Vitals and nursing note reviewed.  Constitutional:      General: She is not in acute distress.    Appearance: She is well-developed.  HENT:     Head: Normocephalic and atraumatic.  Cardiovascular:     Rate and Rhythm: Normal rate and regular rhythm.     Pulses: Normal pulses.     Heart sounds: No murmur heard.   Pulmonary:     Effort: Pulmonary effort is normal. No respiratory distress.  Musculoskeletal:     Cervical back: Normal range of motion.     Right lower  leg: No edema.     Left lower leg: No edema.  Lymphadenopathy:     Cervical: No cervical adenopathy.  Skin:    General: Skin is warm and dry.     Findings: Bruising and laceration present.     Comments: 0.5 inch shallow laceration left index finger distal phalanx - dried blood noted subungal and nail hematoma right thumb  Neurological:     Mental Status: She is alert and oriented to person, place, and time.     Motor: Weakness present.     Gait: Gait abnormal.  Psychiatric:        Attention and Perception: Attention normal.        Mood and Affect: Mood normal.     Wt Readings from Last 3 Encounters:  11/25/19 140 lb (63.5 kg)  07/30/19 144 lb (65.3 kg)  04/29/19 144 lb 6 oz (65.5 kg)    BP 118/62   Pulse (!) 57   Ht '5\' 4"'  (1.626 m)   Wt 140 lb (63.5 kg)   SpO2 97%   BMI 24.03 kg/m   Assessment and Plan: 1. Type II diabetes mellitus with complication (HCC) Clinically stable by exam and report without s/s of hypoglycemia. DM complicated by lipids. Tolerating medications - metformin -  well without side effects or other concerns. - Hemoglobin A1c - Comprehensive metabolic panel  2. Hyperlipidemia associated with type 2 diabetes mellitus ([place]) Now back on atorvastatin daily without side effects - Lipid panel - atorvastatin (LIPITOR) 20 MG tablet; Take 1 tablet (20 mg total) by mouth daily.  Dispense: 30 tablet; Refill: 5  3. Dysphagia, unspecified type Intermittent swallowing difficulties - no choking or regurgitation noted Try increasing omeprazole to bid - omeprazole (PRILOSEC) 20 MG capsule; TAKE 1 CAPSULE BY MOUTH twice a day  Dispense: 60 capsule; Refill: 5  4. Laceration of blood vessel of left index finger, initial encounter - Tdap vaccine greater than or equal to 7yo IM   Partially dictated using Editor, commissioning. Any errors are unintentional.  Halina Maidens, MD [facility]  11/25/2019

## 2019-11-26 ENCOUNTER — Other Ambulatory Visit: Payer: Self-pay | Admitting: Internal Medicine

## 2019-11-26 DIAGNOSIS — E785 Hyperlipidemia, unspecified: Secondary | ICD-10-CM

## 2019-11-26 DIAGNOSIS — E1169 Type 2 diabetes mellitus with other specified complication: Secondary | ICD-10-CM

## 2019-11-26 LAB — COMPREHENSIVE METABOLIC PANEL
ALT: 14 IU/L (ref 0–32)
AST: 11 IU/L (ref 0–40)
Albumin/Globulin Ratio: 2.1 (ref 1.2–2.2)
Albumin: 4.6 g/dL (ref 3.8–4.8)
Alkaline Phosphatase: 64 IU/L (ref 44–121)
BUN/Creatinine Ratio: 14 (ref 12–28)
BUN: 8 mg/dL (ref 8–27)
Bilirubin Total: 0.3 mg/dL (ref 0.0–1.2)
CO2: 23 mmol/L (ref 20–29)
Calcium: 9.7 mg/dL (ref 8.7–10.3)
Chloride: 101 mmol/L (ref 96–106)
Creatinine, Ser: 0.57 mg/dL (ref 0.57–1.00)
GFR calc Af Amer: 113 mL/min/{1.73_m2} (ref >59)
GFR calc non Af Amer: 98 mL/min/{1.73_m2} (ref >59)
Globulin, Total: 2.2 g/dL (ref 1.5–4.5)
Glucose: 150 mg/dL — ABNORMAL HIGH (ref 65–99)
Potassium: 4.4 mmol/L (ref 3.5–5.2)
Sodium: 141 mmol/L (ref 134–144)
Total Protein: 6.8 g/dL (ref 6.0–8.5)

## 2019-11-26 LAB — HEMOGLOBIN A1C
Est. average glucose Bld gHb Est-mCnc: 200 mg/dL
Hgb A1c MFr Bld: 8.6 % — ABNORMAL HIGH (ref 4.8–5.6)

## 2019-11-26 LAB — LIPID PANEL
Chol/HDL Ratio: 2.4 ratio (ref 0.0–4.4)
Cholesterol, Total: 134 mg/dL (ref 100–199)
HDL: 55 mg/dL (ref >39)
LDL Chol Calc (NIH): 63 mg/dL (ref 0–99)
Triglycerides: 86 mg/dL (ref 0–149)
VLDL Cholesterol Cal: 16 mg/dL (ref 5–40)

## 2019-11-27 ENCOUNTER — Other Ambulatory Visit: Payer: Self-pay

## 2019-11-27 MED ORDER — DAPAGLIFLOZIN PROPANEDIOL 10 MG PO TABS: 10.0000 mg | ORAL_TABLET | Freq: Every day | ORAL | 5 refills | Status: DC

## 2019-12-14 ENCOUNTER — Telehealth: Payer: Self-pay

## 2019-12-14 DIAGNOSIS — Z79891 Long term (current) use of opiate analgesic: Secondary | ICD-10-CM | POA: Diagnosis not present

## 2019-12-14 DIAGNOSIS — Z5181 Encounter for therapeutic drug level monitoring: Secondary | ICD-10-CM | POA: Diagnosis not present

## 2019-12-14 DIAGNOSIS — Z96659 Presence of unspecified artificial knee joint: Secondary | ICD-10-CM | POA: Diagnosis not present

## 2019-12-14 DIAGNOSIS — M6530 Trigger finger, unspecified finger: Secondary | ICD-10-CM | POA: Diagnosis not present

## 2019-12-14 DIAGNOSIS — M47816 Spondylosis without myelopathy or radiculopathy, lumbar region: Secondary | ICD-10-CM | POA: Diagnosis not present

## 2019-12-14 DIAGNOSIS — G894 Chronic pain syndrome: Secondary | ICD-10-CM | POA: Diagnosis not present

## 2019-12-14 DIAGNOSIS — M5416 Radiculopathy, lumbar region: Secondary | ICD-10-CM | POA: Diagnosis not present

## 2019-12-14 DIAGNOSIS — G2581 Restless legs syndrome: Secondary | ICD-10-CM | POA: Diagnosis not present

## 2019-12-14 DIAGNOSIS — M25559 Pain in unspecified hip: Secondary | ICD-10-CM | POA: Diagnosis not present

## 2019-12-14 DIAGNOSIS — Z79899 Other long term (current) drug therapy: Secondary | ICD-10-CM | POA: Diagnosis not present

## 2019-12-14 DIAGNOSIS — M25569 Pain in unspecified knee: Secondary | ICD-10-CM | POA: Diagnosis not present

## 2019-12-14 DIAGNOSIS — M19049 Primary osteoarthritis, unspecified hand: Secondary | ICD-10-CM | POA: Diagnosis not present

## 2019-12-14 NOTE — Chronic Care Management (AMB) (Signed)
  Chronic Care Management   Note  12/14/2019 Name: Hannah [name] MRN: [id number] DOB: 02-23-1955  Hannah [name] is a 64 y.o. year old female who is a primary care patient of Glean Hess, MD. I reached out to Vanetta Mulders by phone today in response to a referral sent by Ms. [employer] health plan.     Ms. Mofield was given information about Chronic Care Management services today including:  1. CCM service includes personalized support from designated clinical staff supervised by her physician, including individualized plan of care and coordination with other care providers 2. 24/7 contact phone numbers for assistance for urgent and routine care needs. 3. Service will only be billed when office clinical staff spend 20 minutes or more in a month to coordinate care. 4. Only one practitioner may furnish and bill the service in a calendar month. 5. The patient may stop CCM services at any time (effective at the end of the month) by phone call to the office staff. 6. The patient will be responsible for cost sharing (co-pay) of up to 20% of the service fee (after annual deductible is met).  Patient agreed to services and verbal consent obtained.   Follow up plan: Telephone appointment with care management team member scheduled for:12/15/2019  Noreene Larsson, [place], [place], [place] [address] Direct Dial: [phone] [email or web address]: [email or web address]

## 2019-12-15 ENCOUNTER — Ambulatory Visit: Payer: Medicare HMO | Admitting: Pharmacist

## 2019-12-15 DIAGNOSIS — E118 Type 2 diabetes mellitus with unspecified complications: Secondary | ICD-10-CM

## 2019-12-15 DIAGNOSIS — E1169 Type 2 diabetes mellitus with other specified complication: Secondary | ICD-10-CM

## 2019-12-15 DIAGNOSIS — E785 Hyperlipidemia, unspecified: Secondary | ICD-10-CM

## 2019-12-15 NOTE — Progress Notes (Signed)
Chronic Care Management Pharmacy  Name: Hannah [name]  MRN: [id number] DOB: 1955/07/26  Chief Complaint/ HPI  Hannah [name],  64 y.o. , female presents for their Initial CCM visit with the clinical pharmacist via telephone.  PCP : Glean Hess, MD  Their chronic conditions include: Diabetes, COPD, Osteoporosis, Tobacco use and DJD and spastic paraplegia  Office Visits: 11/25/19-Dr. Army Melia - bloodwork, a1c 8.6% starting [place] Visit: 10/15/19- [facility] 2016- hip hardware exchange  Medications: Outpatient Encounter Medications as of 12/15/2019  Medication Sig Note  . albuterol (VENTOLIN HFA) 108 (90 Base) MCG/ACT inhaler INHALE 2 PUFFS BY MOUTH INTO THE LUNGS EVERY 6 HOURS AS NEEDED FOR WHEEZING OR SHORTNESS OF BREATH   . atorvastatin (LIPITOR) 20 MG tablet Take 1 tablet (20 mg total) by mouth daily.   . Blood Glucose Monitoring Suppl (ONETOUCH VERIO) w/Device KIT 1 each by Does not apply route 2 (two) times daily. Test BS twice daily.   . dapagliflozin propanediol (FARXIGA) 10 MG TABS tablet Take 1 tablet (10 mg total) by mouth daily before breakfast.   . diclofenac sodium (VOLTAREN) 1 % GEL APP 4 GRAMS EXT AA QID UTD   . Fluticasone-Salmeterol (ADVAIR DISKUS) 250-50 MCG/DOSE AEPB Inhale 1 puff into the lungs 2 (two) times daily.   Marland Kitchen gabapentin (NEURONTIN) 600 MG tablet Take 1 tablet (600 mg total) by mouth 3 (three) times daily. (Patient taking differently: Take 600 mg by mouth 3 (three) times daily. Taking 2 tabs qam) 04/29/2019: Only taking one in the morning.  Marland Kitchen glucose blood test strip Test BS twice daily.   . metFORMIN (GLUCOPHAGE-XR) 500 MG 24 hr tablet Take 1 tablet (500 mg total) by mouth in the morning, at noon, and at bedtime. 2 in the AM and 1 in PM (Patient taking differently: Take 500 mg by mouth in the morning, at noon, in the evening, and at bedtime. 2 in the AM and1 in PM)   . omeprazole (PRILOSEC) 20 MG capsule TAKE 1 CAPSULE BY MOUTH twice a  day (Patient taking differently: TAKE 1 CAPSULE BY MOUTH twice a day Taking qam)   . oxyCODONE-acetaminophen (PERCOCET) 10-325 MG tablet TK 1 T PO Q 4 HOURS PRN FOR PAIN. MAX OF 4 TS PER DAY   . pramipexole (MIRAPEX) 0.125 MG tablet Mirapex 0.125 mg tablet  1 to 2 in pm for RLS   . tiZANidine (ZANAFLEX) 4 MG tablet Take 4 mg by mouth every 6 (six) hours as needed.    . ALPRAZolam (XANAX) 0.5 MG tablet Take 0.5 mg by mouth 3 (three) times daily as needed. (Patient not taking: Reported on 12/15/2019)   . diclofenac (FLECTOR) 1.3 % PTCH Flector 1.3 % transdermal 12 hour patch   . fluticasone (FLONASE) 50 MCG/ACT nasal spray Place 2 sprays into both nostrils daily.    No facility-administered encounter medications on file as of 12/15/2019.    Current Diagnosis/Assessment:    Goals Addressed            This Visit's Progress   . Pharmacy Care Plan       CARE PLAN ENTRY (see longitudinal plan of care for additional care plan information)  Current Barriers:  . Chronic Disease Management support, education, and care coordination needs related to Diabetes, COPD, Osteoporosis, Tobacco use and DJD and spastic paraplegia    Hyperlipidemia Lab Results  Component Value Date/Time   LDLCALC 63 11/25/2019 10:52 AM   . Pharmacist Clinical Goal(s): o Over the next  60 days, patient will work with PharmD and providers to maintain LDL goal < 70 . Current regimen:  o Atorvastatin 20 mg daily . Interventions: o Provided diet and exercise counseling. . Patient self care activities - Over the next 60 days, patient will: o Follow heart healthy diet o Attend aqua therapy appointments  Diabetes Lab Results  Component Value Date/Time   HGBA1C 8.6 (H) 11/25/2019 10:52 AM   HGBA1C 8.4 (H) 07/30/2019 10:52 AM   HGBA1C 7.6 (H) 03/30/2013 04:36 AM   . Pharmacist Clinical Goal(s): o Over the next 60 days, patient will work with PharmD and providers to achieve A1c goal <7% . Current regimen:   . Farxiga 10 mg qd--taking at bedtime . Metformin xr  500 mg 2 qam 1 with dinner  . Interventions: o Provided diet and exercise counseling. o Reviewed goal glucose readings for an A1c of <7%, we want to see fasting sugars <130 and 2 hour after meal sugars <180.  o Discussed sgins/symptoms of hypoglycemia . Patient self care activities - Over the next 60 days, patient will: o Check blood sugar in the morning before eating or drinking and occasionally 2-3 hours after a meal document, and provide at future appointments o Contact provider with any episodes of hypoglycemia  Medication management . Pharmacist Clinical Goal(s): o Over the next 60 days, patient will work with PharmD and providers to achieve optimal medication adherence . Current pharmacy: [facility] . Interventions o Comprehensive medication review performed. o Continue current medication management strategy . Patient self care activities - Over the next 60 days, patient will: o Focus on medication adherence by fill dates o Take medications as prescribed o Report any questions or concerns to PharmD and/or provider(s)  Initial goal documentation         Diabetes   A1c goal <7%  Recent Relevant Labs: Lab Results  Component Value Date/Time   HGBA1C 8.6 (H) 11/25/2019 10:52 AM   HGBA1C 8.4 (H) 07/30/2019 10:52 AM   HGBA1C 7.6 (H) 03/30/2013 04:36 AM   MICROALBUR normal 04/29/2019 12:00 AM    BMP Latest Ref Rng & Units 11/25/2019 04/29/2019 01/28/2019  Glucose 65 - 99 mg/dL 150(H) 162(H) 175(H)  BUN 8 - 27 mg/dL '8 8 13  ' Creatinine 0.57 - 1.00 mg/dL 0.57 0.55(L) 0.57  BUN/Creat Ratio 12 - '28 14 15 23  ' Sodium 134 - 144 mmol/L 141 139 140  Potassium 3.5 - 5.2 mmol/L 4.4 4.5 4.6  Chloride 96 - 106 mmol/L 101 103 103  CO2 20 - 29 mmol/L '23 24 23  ' Calcium 8.7 - 10.3 mg/dL 9.7 9.7 9.8    Last diabetic Eye exam: No results found for: HMDIABEYEEXA  Last diabetic Foot exam: No results found for: HMDIABFOOTEX   Checking  BG: Daily  Recent FBG Readings: 130- 156  Patient has failed these meds in past: NA Patient is currently uncontrolled on the following medications: . Farxiga 10 mg qd--taking at bedtime . Metformin xr  500 mg 2 qam 1 with dinner   We discussed: Diet and exercise. Patient uses air fryer to prepare most meals and typically eats veggies, protein and small amounts of starches occasionally. She uses sugar-free flavorings for water. She is concerned about A1C and recently refused a steroid injection for left hand arthritis pain to avoid increasing BG. She has been taking [place] ~two weeks and her fasting levels have come down from 200s to 130-150s.  She is scheduled to start aqua therapy tomorrow and uses a walker  at baseline. I do not see a B12 level in chart. Patient has no copay for prescriptions.  Plan  Continue current medications. Recommend B12 level with next lab draws given long term metformin use.     Hyperlipidemia   LDL goal < 70  Lipid Panel     Component Value Date/Time   CHOL 134 11/25/2019 1052   TRIG 86 11/25/2019 1052   HDL 55 11/25/2019 1052   LDLCALC 63 11/25/2019 1052    Hepatic Function Latest Ref Rng & Units 11/25/2019 04/29/2019 01/28/2019  Total Protein 6.0 - 8.5 g/dL 6.8 6.7 6.3  Albumin 3.8 - 4.8 g/dL 4.6 4.4 4.3  AST 0 - 40 IU/L '11 13 13  ' ALT 0 - 32 IU/L '14 17 14  ' Alk Phosphatase 44 - 121 IU/L 64 76 102  Total Bilirubin 0.0 - 1.2 mg/dL 0.3 0.3 0.3     The 10-year ASCVD risk score Mikey Bussing DC Jr., et al., 2013) is: 6.5%   Values used to calculate the score:     Age: 76 years     Sex: Female     Is Non-Hispanic African American: No     Diabetic: Yes     Tobacco smoker: No     Systolic Blood Pressure: 384 mmHg     Is BP treated: No     HDL Cholesterol: 55 mg/dL     Total Cholesterol: 134 mg/dL   Patient has failed these meds in past: NA Patient is currently controlled on the following medications:  . Atorvastatin 20 mg qd  We discussed:  diet and  exercise extensively  Plan  Continue current medications  Osteopenia / Osteoporosis   Last DEXA Scan: Not available in chart    No results found for: VD25OH   Patient reports previously taken Prolia injections. Has a history of hip fracture and total knee replacement. Patient uses a walker.  Patient has failed these meds in past: NA Patient is currently query controlled on the following medications:   We discussed:  Recommend [phone] units of vitamin D daily. Recommend 1200 mg of calcium daily from dietary and supplemental sources.  Plan  Recommend vitamin d level and  DEXA scan given history of hip fracture.    COPD/Emphysema   Last spirometry score: Not available in chart  Gold Grade: Unknown Current COPD Classification:  A (low sx, <2 exacerbations/yr)  Lab Results  Component Value Date/Time   EOSPCT 1 07/21/2018 07:19 PM   EOSPCT 1.3 03/31/2013 06:14 AM   EOSABS 0.1 04/29/2019 10:44 AM   EOSABS 0.2 03/31/2013 06:14 AM    Patient has failed these meds in past: NA Patient is currently controlled on the following medications:  . Advair 250/50 1 puff bid---takes once daily . Albuterol 2 puffs q 6 h prn  Using maintenance inhaler regularly? Yes Frequency of rescue inhaler use:  daily  We discussed:  proper inhaler technique. Patient states she uses Advair only once daily most days. She does not rinse her mouth after but drinks coffee after. Reports symptoms pretty well controlled and usually fluctuate seasonally.  Plan  Continue current medications. Consider spirometry testing.    /Dysphagia   Patient has failed these meds in past: NA  Patient is currently controlled on the following medications:  . Omeprazole 20 mg bid (taking qam)  We discussed:  Patient has trouble swallowing and states food gets "stuck" in her throat if she eats "dry" meats. She reports omeprazole helps with this. She does not recall  being diagnosed with a hernia. No EGD results in  chart.  Plan  Continue current medications    Spastic paraplegia   Patient has failed these meds in past: NA Patient is currently controlled on the following medications:  . Gabapentin 600 mg tid (taking 2 in the morning) . Pramipexole 0.125 mg qhs . Oxycodone/APAP 5/325 mg 1 tab 4-5 times daily . Tizanidine 4 mg q8h prn (taking at night)   We discussed:  Patient uses back braces, heat and ice as complimentary therapy to help manage back pain. She follows with pain management. Does not take Oxycodone if she doesn't need it and often has excess each month. She has an initial aqua therapy appointment tomorrow and is hopeful this will be beneficial. She remains active and even helps her son in the yard while using her walker. We discussed potential sedating effects of gabapentin as she reports "feeling zapped of energy" mid day. Suggested she adjust timing of gabapentin.   Plan  Continue current medications    Vaccines   Reviewed and discussed patient's vaccination history.    Immunization History  Administered Date(s) Administered  . Influenza, Seasonal, Injecte, Preservative Fre 11/29/2011  . Influenza,inj,Quad PF,6+ Mos 11/24/2014, 11/25/2015, 12/24/2016, 12/11/2017, 10/29/2018, 11/25/2019  . Influenza,inj,quad, With Preservative 03/08/2017  . PFIZER SARS-COV-2 Vaccination 05/18/2019, 06/10/2019  . Pneumococcal Conjugate-13 11/24/2014  . Pneumococcal Polysaccharide-23 05/20/2012  . Tdap 11/25/2019    Plan  Will discuss at follow-up visit.  Medication Management   Pt uses [facility] for all medications Uses pill box? Yes Pt endorses 95 % compliance  We discussed: Discussed benefits of medication synchronization, packaging and delivery as well as enhanced pharmacist oversight with [employer].  Plan  Continue current medication management strategy    Follow up: 2 month phone visit  Junita Push. Kenton Kingfisher PharmD, [facility] [phone]

## 2019-12-17 NOTE — Patient Instructions (Addendum)
Visit Information  It was a pleasure speaking with you today! Thank you for letting me be a part of your care team. Please call with any questions or concerns.  Goals Addressed            This Visit's Progress   . Pharmacy Care Plan       CARE PLAN ENTRY (see longitudinal plan of care for additional care plan information)  Current Barriers:  . Chronic Disease Management support, education, and care coordination needs related to Diabetes, COPD, Osteoporosis, Tobacco use and DJD and spastic paraplegia    Hyperlipidemia Lab Results  Component Value Date/Time   LDLCALC 63 11/25/2019 10:52 AM   . Pharmacist Clinical Goal(s): o Over the next 60 days, patient will work with PharmD and providers to maintain LDL goal < 70 . Current regimen:  o Atorvastatin 20 mg daily . Interventions: o Provided diet and exercise counseling. . Patient self care activities - Over the next 60 days, patient will: o Follow heart healthy diet o Attend aqua therapy appointments  Diabetes Lab Results  Component Value Date/Time   HGBA1C 8.6 (H) 11/25/2019 10:52 AM   HGBA1C 8.4 (H) 07/30/2019 10:52 AM   HGBA1C 7.6 (H) 03/30/2013 04:36 AM   . Pharmacist Clinical Goal(s): o Over the next 60 days, patient will work with PharmD and providers to achieve A1c goal <7% . Current regimen:  . Farxiga 10 mg qd--taking at bedtime . Metformin xr  500 mg 2 qam 1 with dinner  . Interventions: o Provided diet and exercise counseling. o Reviewed goal glucose readings for an A1c of <7%, we want to see fasting sugars <130 and 2 hour after meal sugars <180.  o Discussed sgins/symptoms of hypoglycemia . Patient self care activities - Over the next 60 days, patient will: o Check blood sugar in the morning before eating or drinking and occasionally 2-3 hours after a meal document, and provide at future appointments o Contact provider with any episodes of hypoglycemia  Medication management . Pharmacist Clinical  Goal(s): o Over the next 60 days, patient will work with PharmD and providers to achieve optimal medication adherence . Current pharmacy: [facility] . Interventions o Comprehensive medication review performed. o Continue current medication management strategy . Patient self care activities - Over the next 60 days, patient will: o Focus on medication adherence by fill dates o Take medications as prescribed o Report any questions or concerns to PharmD and/or provider(s)  Initial goal documentation        Ms. Metzinger was given information about Chronic Care Management services today including:  1. CCM service includes personalized support from designated clinical staff supervised by her physician, including individualized plan of care and coordination with other care providers 2. 24/7 contact phone numbers for assistance for urgent and routine care needs. 3. Standard insurance, coinsurance, copays and deductibles apply for chronic care management only during months in which we provide at least 20 minutes of these services. Most insurances cover these services at 100%, however patients may be responsible for any copay, coinsurance and/or deductible if applicable. This service may help you avoid the need for more expensive face-to-face services. 4. Only one practitioner may furnish and bill the service in a calendar month. 5. The patient may stop CCM services at any time (effective at the end of the month) by phone call to the office staff.  Patient agreed to services and verbal consent obtained.   The patient verbalized understanding of instructions provided today and  agreed to receive a mailed copy of patient instruction and/or educational materials. Telephone follow up appointment with pharmacy team member scheduled for: 2 months  Junita Push. Tison Leibold PharmD, BCPS Clinical Pharmacist [phone]  Type 2 Diabetes Mellitus, Self Care, Adult Caring for yourself after you have been diagnosed  with type 2 diabetes (type 2 diabetes mellitus) means keeping your blood sugar (glucose) under control with a balance of:  Nutrition.  Exercise.  Lifestyle changes.  Medicines or insulin, if necessary.  Support from your team of health care providers and others. The following information explains what you need to know to manage your diabetes at home. What are the risks? Having diabetes can put you at risk for other long-term (chronic) conditions, such as heart disease and kidney disease. Your health care provider may prescribe medicines to help prevent complications from diabetes. These medicines may include:  Aspirin.  Medicine to lower cholesterol.  Medicine to control blood pressure. How to monitor blood glucose   Check your blood glucose every day, as often as told by your health care provider.  Have your A1c (hemoglobin A1c) level checked two or more times a year, or as often as told by your health care provider. Your health care provider will set individualized treatment goals for you. Generally, the goal of treatment is to maintain the following blood glucose levels:  Before meals (preprandial): 80-130 mg/dL (4.4-7.2 mmol/L).  After meals (postprandial): below 180 mg/dL (10 mmol/L).  A1c level: less than 7%. How to manage hyperglycemia and hypoglycemia Hyperglycemia symptoms Hyperglycemia, also called high blood glucose, occurs when blood glucose is too high. Make sure you know the early signs of hyperglycemia, such as:  Increased thirst.  Hunger.  Feeling very tired.  Needing to urinate more often than usual.  Blurry vision. Hypoglycemia symptoms Hypoglycemia, also called low blood glucose, occurswith a blood glucose level at or below 70 mg/dL (3.9 mmol/L). The risk for hypoglycemia increases during or after exercise, during sleep, during illness, and when skipping meals or not eating for a long time (fasting). It is important to know the symptoms of  hypoglycemia and treat it right away. Always have a 15-gram rapid-acting carbohydrate snack with you to treat low blood glucose. Family members and close friends should also know the symptoms and should understand how to treat hypoglycemia, in case you are not able to treat yourself. Symptoms may include:  Hunger.  Anxiety.  Sweating and feeling clammy.  Confusion.  Dizziness or feeling light-headed.  Sleepiness.  Nausea.  Increased heart rate.  Headache.  Blurry vision.  Irritability.  A change in coordination.  Tingling or numbness around the mouth, lips, or tongue.  Restless sleep.  Fainting.  Seizure. Treating hypoglycemia If you are alert and able to swallow safely, follow the 15:15 rule:  Take 15 grams of a rapid-acting carbohydrate. Talk with your health care provider about how much you should take.  Rapid-acting options include: ? Glucose pills (take 15 grams). ? 6-8 pieces of hard candy. ? 4-6 oz (120-150 mL) of fruit juice. ? 4-6 oz (120-150 mL) of regular (not diet) soda. ? 1 Tbsp (15 mL) honey or sugar.  Check your blood glucose 15 minutes after you take the carbohydrate.  If the repeat blood glucose level is still at or below 70 mg/dL (3.9 mmol/L), take 15 grams of a carbohydrate again.  If your blood glucose level does not increase above 70 mg/dL (3.9 mmol/L) after 3 tries, seek emergency medical care.  After your blood  glucose level returns to normal, eat a meal or a snack within 1 hour. Treating severe hypoglycemia Severe hypoglycemia is when your blood glucose level is at or below 54 mg/dL (3 mmol/L). Severe hypoglycemia is an emergency. Do not wait to see if the symptoms will go away. Get medical help right away. Call your local emergency services (911 in the [place] If you have severe hypoglycemia and you cannot eat or drink, you may need an injection of glucagon. A family member or close friend should learn how to check your blood glucose and  how to give you a glucagon injection. Ask your health care provider if you need to have an emergency glucagon injection kit available. Severe hypoglycemia may need to be treated in a hospital. The treatment may include getting glucose through an IV. You may also need treatment for the cause of your hypoglycemia. Follow these instructions at home: Take diabetes medicines as told  If your health care provider prescribed insulin or diabetes medicines, take them every day.  Do not run out of insulin or other diabetes medicines that you take. Plan ahead so you always have these available.  If you use insulin, adjust your dosage based on how physically active you are and what foods you eat. Your health care provider will tell you how to adjust your dosage. Make healthy food choices  The things that you eat and drink affect your blood glucose and your insulin dosage. Making good choices helps to control your diabetes and prevent other health problems. A healthy meal plan includes eating lean proteins, complex carbohydrates, fresh fruits and vegetables, low-fat dairy products, and healthy fats. Make an appointment to see a diet and nutrition specialist (registered dietitian) to help you create an eating plan that is right for you. Make sure that you:  Follow instructions from your health care provider about eating or drinking restrictions.  Drink enough fluid to keep your urine pale yellow.  Keep a record of the carbohydrates that you eat. Do this by reading food labels and learning the standard serving sizes of foods.  Follow your sick day plan whenever you cannot eat or drink as usual. Make this plan in advance with your health care provider.  Stay active Exercise regularly, as told by your health care provider. This may include:  Stretching and doing strength exercises, such as yoga or weightlifting, 2 or more times a week.  Doing 150 minutes or more of moderate-intensity or vigorous-intensity  exercise each week. This could be brisk walking, biking, or water aerobics. ? Spread out your activity over 3 or more days of the week. ? Do not go more than 2 days in a row without doing some kind of physical activity. When you start a new exercise or activity, work with your health care provider to adjust your insulin, medicines, or food intake as needed. Make healthy lifestyle choices  Do not use any tobacco products, such as cigarettes, chewing tobacco, and e-cigarettes. If you need help quitting, ask your health care provider.  If your health care provider says that alcohol is safe for you, limit alcohol intake to no more than 1 drink per day for nonpregnant women and 2 drinks per day for men. One drink equals 12 oz of beer (355 mL), 5 oz of wine (148 mL), or 1 oz of hard liquor (44 mL).  Learn to manage stress. If you need help with this, ask your health care provider. Care for your body   Keep your  immunizations up to date. In addition to getting vaccinations as told by your health care provider, it is recommended that you get vaccinated against the following illnesses: ? The flu (influenza). Get a flu shot every year. ? Pneumonia. ? Hepatitis B.  Schedule an eye exam soon after your diagnosis, and then one time every year after that.  Check your skin and feet every day for cuts, bruises, redness, blisters, or sores. Schedule a foot exam with your health care provider once every year.  Brush your teeth and gums two times a day, and floss one or more times a day. Visit your dentist one or more times every 6 months.  Maintain a healthy weight. General instructions  Take over-the-counter and prescription medicines only as told by your health care provider.  Share your diabetes management plan with people in your workplace, school, and household.  Carry a medical alert card or wear medical alert jewelry.  Keep all follow-up visits as told by your health care provider. This is  important. Questions to ask your health care provider  Do I need to meet with a diabetes educator?  Where can I find a support group for people with diabetes? Where to find more information For more information about diabetes, visit:  American Diabetes Association (ADA): www.diabetes.org  American Association of Diabetes Educators (AADE): www.diabeteseducator.org Summary  Caring for yourself after you have been diagnosed with (type 2 diabetes mellitus) means keeping your blood sugar (glucose) under control with a balance of nutrition, exercise, lifestyle changes, and medicine.  Check your blood glucose every day, as often as told by your health care provider.  Having diabetes can put you at risk for other long-term (chronic) conditions, such as heart disease and kidney disease. Your health care provider may prescribe medicines to help prevent complications from diabetes.  Keep all follow-up visits as told by your health care provider. This is important. This information is not intended to replace advice given to you by your health care provider. Make sure you discuss any questions you have with your health care provider. Document Revised: 07/29/2017 Document Reviewed: 03/11/2015 Elsevier Patient Education  2020 [employer].

## 2019-12-18 DIAGNOSIS — R269 Unspecified abnormalities of gait and mobility: Secondary | ICD-10-CM | POA: Diagnosis not present

## 2019-12-18 DIAGNOSIS — R6889 Other general symptoms and signs: Secondary | ICD-10-CM | POA: Diagnosis not present

## 2019-12-18 DIAGNOSIS — M545 Low back pain, unspecified: Secondary | ICD-10-CM | POA: Diagnosis not present

## 2019-12-23 DIAGNOSIS — R6889 Other general symptoms and signs: Secondary | ICD-10-CM | POA: Diagnosis not present

## 2019-12-23 DIAGNOSIS — M545 Low back pain, unspecified: Secondary | ICD-10-CM | POA: Diagnosis not present

## 2019-12-23 DIAGNOSIS — R269 Unspecified abnormalities of gait and mobility: Secondary | ICD-10-CM | POA: Diagnosis not present

## 2020-01-04 ENCOUNTER — Ambulatory Visit (INDEPENDENT_AMBULATORY_CARE_PROVIDER_SITE_OTHER): Payer: Medicare HMO

## 2020-01-04 ENCOUNTER — Other Ambulatory Visit: Payer: Self-pay

## 2020-01-04 VITALS — BP 120/66 | HR 71 | Temp 98.2°F | Resp 16 | Ht 64.0 in | Wt 132.4 lb

## 2020-01-04 DIAGNOSIS — Z0000 Encounter for general adult medical examination without abnormal findings: Secondary | ICD-10-CM | POA: Diagnosis not present

## 2020-01-04 NOTE — Patient Instructions (Signed)
Hannah [name] , Thank you for taking time to come for your Medicare Wellness Visit. I appreciate your ongoing commitment to your health goals. Please review the following plan we discussed and let me know if I can assist you in the future.   Screening recommendations/referrals: Colonoscopy: done 03/05/16. Repeat in 2023 Mammogram: done 05/06/19 Bone Density: need records from [facility] for repeat screening recommendations Recommended yearly ophthalmology/optometry visit for glaucoma screening and checkup Recommended yearly dental visit for hygiene and checkup  Vaccinations: Influenza vaccine: done 11/25/19 Pneumococcal vaccine: done 11/24/14 Tdap vaccine: done 11/25/19 Shingles vaccine: Shingrix discussed. Please contact your pharmacy for coverage information.  Covid-19: done 05/18/19 & 06/10/19  Advanced directives: Advance directive discussed with you today. I have provided a copy for you to complete at home and have notarized. Once this is complete please bring a copy in to our office so we can scan it into your chart.  Conditions/risks identified: Recommend continuing to monitor diet and blood sugar to lower A1c  Next appointment: Follow up in one year for your annual wellness visit.   Preventive Care 40-64 Years, Female Preventive care refers to lifestyle choices and visits with your health care provider that can promote health and wellness. What does preventive care include?  A yearly physical exam. This is also called an annual well check.  Dental exams once or twice a year.  Routine eye exams. Ask your health care provider how often you should have your eyes checked.  Personal lifestyle choices, including:  Daily care of your teeth and gums.  Regular physical activity.  Eating a healthy diet.  Avoiding tobacco and drug use.  Limiting alcohol use.  Practicing safe sex.  Taking low-dose aspirin daily starting at age 72.  Taking vitamin and mineral supplements as recommended  by your health care provider. What happens during an annual well check? The services and screenings done by your health care provider during your annual well check will depend on your age, overall health, lifestyle risk factors, and family history of disease. Counseling  Your health care provider may ask you questions about your:  Alcohol use.  Tobacco use.  Drug use.  Emotional well-being.  Home and relationship well-being.  Sexual activity.  Eating habits.  Work and work Statistician.  Method of birth control.  Menstrual cycle.  Pregnancy history. Screening  You may have the following tests or measurements:  Height, weight, and BMI.  Blood pressure.  Lipid and cholesterol levels. These may be checked every 5 years, or more frequently if you are over 49 years old.  Skin check.  Lung cancer screening. You may have this screening every year starting at age 41 if you have a 30-pack-year history of smoking and currently smoke or have quit within the past 15 years.  Fecal occult blood test (FOBT) of the stool. You may have this test every year starting at age 12.  Flexible sigmoidoscopy or colonoscopy. You may have a sigmoidoscopy every 5 years or a colonoscopy every 10 years starting at age 53.  Hepatitis C blood test.  Hepatitis B blood test.  Sexually transmitted disease (STD) testing.  Diabetes screening. This is done by checking your blood sugar (glucose) after you have not eaten for a while (fasting). You may have this done every 1-3 years.  Mammogram. This may be done every 1-2 years. Talk to your health care provider about when you should start having regular mammograms. This may depend on whether you have a family history of breast  cancer.  BRCA-related cancer screening. This may be done if you have a family history of breast, ovarian, tubal, or peritoneal cancers.  Pelvic exam and Pap test. This may be done every 3 years starting at age 9. Starting at age  22, this may be done every 5 years if you have a Pap test in combination with an HPV test.  Bone density scan. This is done to screen for osteoporosis. You may have this scan if you are at high risk for osteoporosis. Discuss your test results, treatment options, and if necessary, the need for more tests with your health care provider. Vaccines  Your health care provider may recommend certain vaccines, such as:  Influenza vaccine. This is recommended every year.  Tetanus, diphtheria, and acellular pertussis (Tdap, Td) vaccine. You may need a Td booster every 10 years.  Zoster vaccine. You may need this after age 71.  Pneumococcal 13-valent conjugate (PCV13) vaccine. You may need this if you have certain conditions and were not previously vaccinated.  Pneumococcal polysaccharide (PPSV23) vaccine. You may need one or two doses if you smoke cigarettes or if you have certain conditions. Talk to your health care provider about which screenings and vaccines you need and how often you need them. This information is not intended to replace advice given to you by your health care provider. Make sure you discuss any questions you have with your health care provider. Document Released: 03/04/2015 Document Revised: 10/26/2015 Document Reviewed: 12/07/2014 Elsevier Interactive Patient Education  2017 [employer] Prevention in the Home Falls can cause injuries. They can happen to people of all ages. There are many things you can do to make your home safe and to help prevent falls. What can I do on the outside of my home?  Regularly fix the edges of walkways and driveways and fix any cracks.  Remove anything that might make you trip as you walk through a door, such as a raised step or threshold.  Trim any bushes or trees on the path to your home.  Use bright outdoor lighting.  Clear any walking paths of anything that might make someone trip, such as rocks or tools.  Regularly check to  see if handrails are loose or broken. Make sure that both sides of any steps have handrails.  Any raised decks and porches should have guardrails on the edges.  Have any leaves, snow, or ice cleared regularly.  Use sand or salt on walking paths during winter.  Clean up any spills in your garage right away. This includes oil or grease spills. What can I do in the bathroom?  Use night lights.  Install grab bars by the toilet and in the tub and shower. Do not use towel bars as grab bars.  Use non-skid mats or decals in the tub or shower.  If you need to sit down in the shower, use a plastic, non-slip stool.  Keep the floor dry. Clean up any water that spills on the floor as soon as it happens.  Remove soap buildup in the tub or shower regularly.  Attach bath mats securely with double-sided non-slip rug tape.  Do not have throw rugs and other things on the floor that can make you trip. What can I do in the bedroom?  Use night lights.  Make sure that you have a light by your bed that is easy to reach.  Do not use any sheets or blankets that are too big for your  bed. They should not hang down onto the floor.  Have a firm chair that has side arms. You can use this for support while you get dressed.  Do not have throw rugs and other things on the floor that can make you trip. What can I do in the kitchen?  Clean up any spills right away.  Avoid walking on wet floors.  Keep items that you use a lot in easy-to-reach places.  If you need to reach something above you, use a strong step stool that has a grab bar.  Keep electrical cords out of the way.  Do not use floor polish or wax that makes floors slippery. If you must use wax, use non-skid floor wax.  Do not have throw rugs and other things on the floor that can make you trip. What can I do with my stairs?  Do not leave any items on the stairs.  Make sure that there are handrails on both sides of the stairs and use  them. Fix handrails that are broken or loose. Make sure that handrails are as long as the stairways.  Check any carpeting to make sure that it is firmly attached to the stairs. Fix any carpet that is loose or worn.  Avoid having throw rugs at the top or bottom of the stairs. If you do have throw rugs, attach them to the floor with carpet tape.  Make sure that you have a light switch at the top of the stairs and the bottom of the stairs. If you do not have them, ask someone to add them for you. What else can I do to help prevent falls?  Wear shoes that:  Do not have high heels.  Have rubber bottoms.  Are comfortable and fit you well.  Are closed at the toe. Do not wear sandals.  If you use a stepladder:  Make sure that it is fully opened. Do not climb a closed stepladder.  Make sure that both sides of the stepladder are locked into place.  Ask someone to hold it for you, if possible.  Clearly mark and make sure that you can see:  Any grab bars or handrails.  First and last steps.  Where the edge of each step is.  Use tools that help you move around (mobility aids) if they are needed. These include:  Canes.  Walkers.  Scooters.  Crutches.  Turn on the lights when you go into a dark area. Replace any light bulbs as soon as they burn out.  Set up your furniture so you have a clear path. Avoid moving your furniture around.  If any of your floors are uneven, fix them.  If there are any pets around you, be aware of where they are.  Review your medicines with your doctor. Some medicines can make you feel dizzy. This can increase your chance of falling. Ask your doctor what other things that you can do to help prevent falls. This information is not intended to replace advice given to you by your health care provider. Make sure you discuss any questions you have with your health care provider. Document Released: 12/02/2008 Document Revised: 07/14/2015 Document Reviewed:  03/12/2014 Elsevier Interactive Patient Education  2017 [employer].

## 2020-01-04 NOTE — Progress Notes (Signed)
Subjective:   Hannah [name] is a 64 y.o. female who presents for Medicare Annual (Subsequent) preventive examination.  Review of Systems     Cardiac Risk Factors include: advanced age (>44mn, >>12women);diabetes mellitus;dyslipidemia;sedentary lifestyle     Objective:    Today's Vitals   01/04/20 1013 01/04/20 1014  BP: 120/66   Pulse: 71   Resp: 16   Temp: 98.2 F (36.8 C)   TempSrc: Oral   SpO2: 96%   Weight: 132 lb 6.4 oz (60.1 kg)   Height: '5\' 4"'  (1.626 m)   PainSc:  6    Body mass index is 22.73 kg/m.  Advanced Directives 01/04/2020 12/29/2018 07/21/2018 07/19/2018 12/25/2017 12/24/2016 03/05/2016  Does Patient Have a Medical Advance Directive? No No No No No No No  Would patient like information on creating a medical advance directive? Yes (MAU/Ambulatory/Procedural Areas - Information given) Yes (MAU/Ambulatory/Procedural Areas - Information given) No - Patient declined - Yes (MAU/Ambulatory/Procedural Areas - Information given) No - Patient declined No - Patient declined    Current Medications (verified) Outpatient Encounter Medications as of 01/04/2020  Medication Sig  . albuterol (VENTOLIN HFA) 108 (90 Base) MCG/ACT inhaler INHALE 2 PUFFS BY MOUTH INTO THE LUNGS EVERY 6 HOURS AS NEEDED FOR WHEEZING OR SHORTNESS OF BREATH  . atorvastatin (LIPITOR) 20 MG tablet Take 1 tablet (20 mg total) by mouth daily.  . Blood Glucose Monitoring Suppl (ONETOUCH VERIO) w/Device KIT 1 each by Does not apply route 2 (two) times daily. Test BS twice daily.  . dapagliflozin propanediol (FARXIGA) 10 MG TABS tablet Take 1 tablet (10 mg total) by mouth daily before breakfast.  . diclofenac sodium (VOLTAREN) 1 % GEL APP 4 GRAMS EXT AA QID UTD  . fluticasone (FLONASE) 50 MCG/ACT nasal spray Place 2 sprays into both nostrils daily.  . Fluticasone-Salmeterol (ADVAIR DISKUS) 250-50 MCG/DOSE AEPB Inhale 1 puff into the lungs 2 (two) times daily.  .Marland Kitchengabapentin (NEURONTIN) 600 MG tablet Take 1  tablet (600 mg total) by mouth 3 (three) times daily.  .Marland Kitchenglucose blood test strip Test BS twice daily.  . metFORMIN (GLUCOPHAGE-XR) 500 MG 24 hr tablet Take 1 tablet (500 mg total) by mouth in the morning, at noon, and at bedtime. 2 in the AM and 1 in PM (Patient taking differently: Take 500 mg by mouth. Pt taking 1 tab in am and 2 tabs in pm)  . omeprazole (PRILOSEC) 20 MG capsule TAKE 1 CAPSULE BY MOUTH twice a day (Patient taking differently: TAKE 1 CAPSULE BY MOUTH twice a day Taking qam)  . oxyCODONE-acetaminophen (PERCOCET) 10-325 MG tablet TK 1 T PO Q 4 HOURS PRN FOR PAIN. MAX OF 4 TS PER DAY  . pramipexole (MIRAPEX) 0.125 MG tablet Mirapex 0.125 mg tablet  1 to 2 in pm for RLS  . tiZANidine (ZANAFLEX) 4 MG tablet Take 4 mg by mouth every 6 (six) hours as needed.   . diclofenac (FLECTOR) 1.3 % PTCH Flector 1.3 % transdermal 12 hour patch (Patient not taking: Reported on 01/04/2020)  . [DISCONTINUED] ALPRAZolam (XANAX) 0.5 MG tablet Take 0.5 mg by mouth 3 (three) times daily as needed. (Patient not taking: Reported on 12/15/2019)   No facility-administered encounter medications on file as of 01/04/2020.    Allergies (verified) Levofloxacin   History: Past Medical History:  Diagnosis Date  . Arthritis   . [place]   . Diet controlled gestational diabetes mellitus in puerperium   . Emphysema of lung (  [place])   . 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 GERD (gastroesophageal reflux disease)   . Hyperlipidemia   . Mood disorder ([place]) 04/29/2019   Past Surgical History:  Procedure Laterality Date  . COLONOSCOPY WITH PROPOFOL N/A 03/05/2016   Procedure: COLONOSCOPY WITH PROPOFOL;  Surgeon: [name], MD;  Location: [facility];  Service: Endoscopy;  Laterality: N/A;  . HIP FRACTURE SURGERY Right 03/2013  . INNER EAR SURGERY Bilateral   . REPLACEMENT TOTAL KNEE Right 02/2015  . SHOULDER SURGERY Bilateral    rotator cuff   Family History   Problem Relation Age of Onset  . Cervical cancer Mother   . Colon cancer Mother   . Diabetes Father   . Heart disease Father   . Breast cancer Sister 25  . Breast cancer Cousin 40       pat cousin  . Diabetes Sister   . Diabetes Sister   . Congestive Heart Failure Paternal Grandmother   . Diabetes Paternal Grandmother    Social History   Socioeconomic History  . Marital status: Widowed    Spouse name: Not on file  . Number of children: 2  . Years of education: Not on file  . Highest education level: Some college, no degree  Occupational History  . Occupation: retired  Tobacco Use  . Smoking status: Former Smoker    Packs/day: 0.50    Years: 20.00    Pack years: 10.00    Types: Cigarettes    Quit date: 03/05/2016    Years since quitting: 3.8  . Smokeless tobacco: Never Used  Vaping Use  . Vaping Use: Never used  Substance and Sexual Activity  . Alcohol use: No  . Drug use: No  . Sexual activity: Not Currently  Other Topics Concern  . Not on file  Social History Narrative   Pt's son lives with her; he is recovering from TBI due to motorcycle accident 09/10/19   Social Determinants of Health   Financial Resource Strain: Low Risk   . Difficulty of Paying Living Expenses: Not very hard  Food Insecurity: No Food Insecurity  . Worried About Charity fundraiser in the Last Year: Never true  . Ran Out of Food in the Last Year: Never true  Transportation Needs: No Transportation Needs  . Lack of Transportation (Medical): No  . Lack of Transportation (Non-Medical): No  Physical Activity: Inactive  . Days of Exercise per Week: 0 days  . Minutes of Exercise per Session: 0 min  Stress: Stress Concern Present  . Feeling of Stress : To some extent  Social Connections: Socially Isolated  . Frequency of Communication with Friends and Family: More than three times a week  . Frequency of Social Gatherings with Friends and Family: More than three times a week  . Attends  Religious Services: Never  . Active Member of Clubs or Organizations: No  . Attends Archivist Meetings: Never  . Marital Status: Widowed    Tobacco Counseling Counseling given: Not Answered   Clinical Intake:  Pre-visit preparation completed: Yes  Pain : 0-10 Pain Score: 6  Pain Type: Chronic pain Pain Location: Back Pain Orientation: Lower Pain Descriptors / Indicators: Aching, Sore Pain Onset: More than a month ago Pain Frequency: Constant     BMI - recorded: 22.73 Nutritional Status: BMI of 19-24  Normal Nutritional Risks: Unintentional weight loss Diabetes: Yes CBG done?: No Did pt. bring in CBG monitor from home?: No  How often do  you need to have someone help you when you read instructions, pamphlets, or other written materials from your doctor or pharmacy?: 1 - Never  Nutrition Risk Assessment:  Has the patient had any N/V/D within the last 2 months?  No  Does the patient have any non-healing wounds?  No  Has the patient had any unintentional weight loss or weight gain?  Yes   Diabetes:  Is the patient diabetic?  Yes  If diabetic, was a CBG obtained today?  No  Did the patient bring in their glucometer from home?  No  How often do you monitor your CBG's? Daily per patient.   Financial Strains and Diabetes Management:  Are you having any financial strains with the device, your supplies or your medication? No .  Does the patient want to be seen by Chronic Care Management for management of their diabetes?  Yes  already enrolled Would the patient like to be referred to a Nutritionist or for Diabetic Management?  No   Diabetic Exams:  Diabetic Eye Exam:. Overdue for diabetic eye exam. Pt has been advised about the importance in completing this exam.   Diabetic Foot Exam: Completed 04/29/19.   Interpreter Needed?: No  Information entered by :: Clemetine Marker LPN   Activities of Daily Living In your present state of health, do you have any  difficulty performing the following activities: 01/04/2020  Hearing? N  Comment declines hearing aids  Vision? N  Difficulty concentrating or making decisions? N  Walking or climbing stairs? Y  Dressing or bathing? Y  Doing errands, shopping? N  Preparing Food and eating ? N  Using the Toilet? N  In the past six months, have you accidently leaked urine? Y  Comment wears depends  Do you have problems with loss of bowel control? N  Managing your Medications? N  Managing your Finances? N  Housekeeping or managing your Housekeeping? N  Some recent data might be hidden    Patient Care Team: Glean Hess, MD as PCP - General (Internal Medicine) [place], Jimmy J, MD as Consulting Physician (Physical Medicine and Rehabilitation) Melvyn Novas, MD as Referring Physician (Orthopedic Surgery) Vladimir Faster, [facility] (Pharmacist)  Indicate any recent Medical Services you may have received from other than [facility] providers in the past year (date may be approximate).     Assessment:   This is a routine wellness examination for [place].  Hearing/Vision screen  Hearing Screening   '125Hz'  '250Hz'  '500Hz'  '1000Hz'  '2000Hz'  '3000Hz'  '4000Hz'  '6000Hz'  '8000Hz'   Right ear:           Left ear:           Comments: Pt denies hearing difficulty  Vision Screening Comments: Pt is past due for eye exam  Dietary issues and exercise activities discussed: Current Exercise Habits: The patient does not participate in regular exercise at present, Exercise limited by: orthopedic condition(s)  Goals    . DIET - EAT MORE FRUITS AND VEGETABLES     Recommend 3-4 servings of fruits and vegetables per day.    . Increase physical activity     Pt would like to increase muscle tone to transition from walker to cane. Pt will have nerve block done on 01/09/19 and physical therapy to follow.     . Pharmacy Care Plan     CARE PLAN ENTRY (see longitudinal plan of care for additional care plan information)  Current Barriers:   . Chronic Disease Management support, education, and care coordination needs related  to Diabetes, COPD, Osteoporosis, Tobacco use and DJD and spastic paraplegia    Hyperlipidemia Lab Results  Component Value Date/Time   LDLCALC 63 11/25/2019 10:52 AM   . Pharmacist Clinical Goal(s): o Over the next 60 days, patient will work with PharmD and providers to maintain LDL goal < 70 . Current regimen:  o Atorvastatin 20 mg daily . Interventions: o Provided diet and exercise counseling. . Patient self care activities - Over the next 60 days, patient will: o Follow heart healthy diet o Attend aqua therapy appointments  Diabetes Lab Results  Component Value Date/Time   HGBA1C 8.6 (H) 11/25/2019 10:52 AM   HGBA1C 8.4 (H) 07/30/2019 10:52 AM   HGBA1C 7.6 (H) 03/30/2013 04:36 AM   . Pharmacist Clinical Goal(s): o Over the next 60 days, patient will work with PharmD and providers to achieve A1c goal <7% . Current regimen:  . Farxiga 10 mg qd--taking at bedtime . Metformin xr  500 mg 2 qam 1 with dinner  . Interventions: o Provided diet and exercise counseling. o Reviewed goal glucose readings for an A1c of <7%, we want to see fasting sugars <130 and 2 hour after meal sugars <180.  o Discussed sgins/symptoms of hypoglycemia . Patient self care activities - Over the next 60 days, patient will: o Check blood sugar in the morning before eating or drinking and occasionally 2-3 hours after a meal document, and provide at future appointments o Contact provider with any episodes of hypoglycemia  Medication management . Pharmacist Clinical Goal(s): o Over the next 60 days, patient will work with PharmD and providers to achieve optimal medication adherence . Current pharmacy: [facility] . Interventions o Comprehensive medication review performed. o Continue current medication management strategy . Patient self care activities - Over the next 60 days, patient will: o Focus on medication  adherence by fill dates o Take medications as prescribed o Report any questions or concerns to PharmD and/or provider(s)  Initial goal documentation     . Prevent Falls     Recommend to remove items from home that may cause trips or slips      Depression Screen PHQ 2/9 Scores 01/04/2020 11/25/2019 07/30/2019 04/29/2019 02/16/2019 12/29/2018 10/29/2018  PHQ - 2 Score 1 1 0 0 0 2 1  PHQ- 9 Score 2 2 0 - '1 6 11    ' Fall Risk Fall Risk  01/04/2020 11/25/2019 07/30/2019 12/29/2018 10/29/2018  Falls in the past year? 1 1 0 1 1  Number falls in past yr: 1 1 0 1 1  Injury with Fall? 1 0 0 0 0  Risk Factor Category  - - - - -  Comment - - - - -  Risk for fall due to : History of fall(s);Impaired balance/gait;Impaired mobility;Orthopedic patient History of fall(s);Impaired balance/gait History of fall(s) Impaired mobility;Impaired balance/gait;History of fall(s) History of fall(s);Impaired balance/gait;Impaired mobility;Medication side effect  Follow up - Falls evaluation completed Falls evaluation completed Falls prevention discussed Falls evaluation completed;Falls prevention discussed    Any stairs in or around the home? Yes  If so, are there any without handrails? No  Home free of loose throw rugs in walkways, pet beds, electrical cords, etc? Yes  Adequate lighting in your home to reduce risk of falls? Yes   ASSISTIVE DEVICES UTILIZED TO PREVENT FALLS:  Life alert? No  Use of a cane, walker or w/c? Yes  Grab bars in the bathroom? No  Shower chair or bench in shower? No  Elevated toilet seat or  a handicapped toilet? Yes   TIMED UP AND GO:  Was the test performed? Yes .  Length of time to ambulate 10 feet: 7 sec.   Gait slow and steady with assistive device  Cognitive Function:     6CIT Screen 01/04/2020 12/29/2018  What Year? 0 points 0 points  What month? 0 points 0 points  What time? 0 points 0 points  Count back from 20 0 points 0 points  Months in reverse 0 points 0 points   Repeat phrase 2 points 0 points  Total Score 2 0    Immunizations Immunization History  Administered Date(s) Administered  . Influenza, Seasonal, Injecte, Preservative Fre 11/29/2011  . Influenza,inj,Quad PF,6+ Mos 11/24/2014, 11/25/2015, 12/24/2016, 12/11/2017, 10/29/2018, 11/25/2019  . Influenza,inj,quad, With Preservative 03/08/2017  . PFIZER SARS-COV-2 Vaccination 05/18/2019, 06/10/2019  . Pneumococcal Conjugate-13 11/24/2014  . Pneumococcal Polysaccharide-23 05/20/2012  . Tdap 11/25/2019    TDAP status: Up to date   Flu Vaccine status: Up to date   Pneumococcal vaccine status: Up to date   Covid-19 vaccine status: Completed vaccines  Qualifies for Shingles Vaccine? Yes   Zostavax completed No   Shingrix Completed?: No.    Education has been provided regarding the importance of this vaccine. Patient has been advised to call insurance company to determine out of pocket expense if they have not yet received this vaccine. Advised may also receive vaccine at local pharmacy or Health Dept. Verbalized acceptance and understanding.  Screening Tests Health Maintenance  Topic Date Due  . OPHTHALMOLOGY EXAM  Never done  . Hepatitis C Screening  03/27/2020 (Originally 1955/06/16)  . PAP SMEAR-Modifier  03/18/2020  . URINE MICROALBUMIN  04/28/2020  . MAMMOGRAM  05/05/2020  . HEMOGLOBIN A1C  05/25/2020  . FOOT EXAM  07/29/2020  . COLONOSCOPY  03/05/2021  . TETANUS/TDAP  11/24/2029  . INFLUENZA VACCINE  Completed  . COVID-19 Vaccine  Completed  . HIV Screening  Completed    Health Maintenance  Health Maintenance Due  Topic Date Due  . OPHTHALMOLOGY EXAM  Never done    Colorectal cancer screening: Completed 03/05/16. Repeat every 5 years   Mammogram status: Completed 05/06/19. Repeat every year    Bone density screening status: pt reports hx of osteoporosis and has had dexa scan in the past but no result available in chart. Pt has been using walker rollator since  2015.  Lung Cancer Screening: (Low Dose CT Chest recommended if Age 39-80 years, 30 pack-year currently smoking OR have quit w/in 15years.) does not qualify.   Additional Screening:  Hepatitis C Screening: does qualify; postponed  Vision Screening: Recommended annual ophthalmology exams for early detection of glaucoma and other disorders of the eye. Is the patient up to date with their annual eye exam?  No  Who is the provider or what is the name of the office in which the patient attends annual eye exams? Not established If pt is not established with a provider, would they like to be referred to a provider to establish care? No .   Dental Screening: Recommended annual dental exams for proper oral hygiene  Community Resource Referral / Chronic Care Management: CRR required this visit?  No   CCM required this visit?  No      Plan:     I have personally reviewed and noted the following in the patient's chart:   . Medical and social history . Use of alcohol, tobacco or illicit drugs  . Current medications and supplements . Functional  ability and status . Nutritional status . Physical activity . Advanced directives . List of other physicians . Hospitalizations, surgeries, and ER visits in previous 12 months . Vitals . Screenings to include cognitive, depression, and falls . Referrals and appointments  In addition, I have reviewed and discussed with patient certain preventive protocols, quality metrics, and best practice recommendations. A written personalized care plan for preventive services as well as general preventive health recommendations were provided to patient.     Clemetine Marker, LPN   [id number]   Nurse Notes: pt lost 8 lbs in the past month since taking farxiga and has more of a decreased appetite but reports blood sugar numbers improving.   Pt c/o tenderness in center lower part of rib cage. Pt advised to schedule appt and pt states she will monitor.

## 2020-01-13 DIAGNOSIS — M5135 Other intervertebral disc degeneration, thoracolumbar region: Secondary | ICD-10-CM | POA: Diagnosis not present

## 2020-01-13 DIAGNOSIS — Y92410 Unspecified street and highway as the place of occurrence of the external cause: Secondary | ICD-10-CM | POA: Diagnosis not present

## 2020-01-13 DIAGNOSIS — M4805 Spinal stenosis, thoracolumbar region: Secondary | ICD-10-CM | POA: Diagnosis not present

## 2020-01-13 DIAGNOSIS — M545 Low back pain, unspecified: Secondary | ICD-10-CM | POA: Diagnosis not present

## 2020-01-13 DIAGNOSIS — R69 Illness, unspecified: Secondary | ICD-10-CM | POA: Diagnosis not present

## 2020-01-26 ENCOUNTER — Telehealth: Payer: Self-pay | Admitting: Internal Medicine

## 2020-01-26 NOTE — Telephone Encounter (Signed)
DEXA shows osteopenia at the hip.  I recommend calcium 1200 mg and vitamin D 400 IU daily.  Repeat in 2 years.

## 2020-01-26 NOTE — Telephone Encounter (Signed)
Called pt with results. Osteopenia at the hip. Take calcium 1200 mg and Vit D 400 UI. Will repeat in 2 years. Pt verbalized understanding.  KP

## 2020-02-08 DIAGNOSIS — M25559 Pain in unspecified hip: Secondary | ICD-10-CM | POA: Diagnosis not present

## 2020-02-08 DIAGNOSIS — G2581 Restless legs syndrome: Secondary | ICD-10-CM | POA: Diagnosis not present

## 2020-02-08 DIAGNOSIS — Z79899 Other long term (current) drug therapy: Secondary | ICD-10-CM | POA: Diagnosis not present

## 2020-02-08 DIAGNOSIS — G894 Chronic pain syndrome: Secondary | ICD-10-CM | POA: Diagnosis not present

## 2020-02-08 DIAGNOSIS — M19049 Primary osteoarthritis, unspecified hand: Secondary | ICD-10-CM | POA: Diagnosis not present

## 2020-02-08 DIAGNOSIS — M5416 Radiculopathy, lumbar region: Secondary | ICD-10-CM | POA: Diagnosis not present

## 2020-02-08 DIAGNOSIS — M47816 Spondylosis without myelopathy or radiculopathy, lumbar region: Secondary | ICD-10-CM | POA: Diagnosis not present

## 2020-02-08 DIAGNOSIS — M6530 Trigger finger, unspecified finger: Secondary | ICD-10-CM | POA: Diagnosis not present

## 2020-02-08 DIAGNOSIS — E1140 Type 2 diabetes mellitus with diabetic neuropathy, unspecified: Secondary | ICD-10-CM | POA: Diagnosis not present

## 2020-02-08 DIAGNOSIS — R202 Paresthesia of skin: Secondary | ICD-10-CM | POA: Diagnosis not present

## 2020-02-18 ENCOUNTER — Other Ambulatory Visit: Payer: Self-pay | Admitting: Internal Medicine

## 2020-02-18 ENCOUNTER — Ambulatory Visit: Payer: Medicare HMO | Admitting: Pharmacist

## 2020-02-18 DIAGNOSIS — E785 Hyperlipidemia, unspecified: Secondary | ICD-10-CM

## 2020-02-18 DIAGNOSIS — E1169 Type 2 diabetes mellitus with other specified complication: Secondary | ICD-10-CM

## 2020-02-18 DIAGNOSIS — J439 Emphysema, unspecified: Secondary | ICD-10-CM

## 2020-02-18 DIAGNOSIS — E118 Type 2 diabetes mellitus with unspecified complications: Secondary | ICD-10-CM

## 2020-02-18 NOTE — Patient Instructions (Addendum)
Visit Information  It was a pleasure speaking with you today. Thank you for letting me be part of your clinical team. Please call with any questions or concerns.   Goals Addressed            This Visit's Progress   . Pharmacy Care Plan       CARE PLAN ENTRY (see longitudinal plan of care for additional care plan information)  Current Barriers:  . Chronic Disease Management support, education, and care coordination needs related to Diabetes, COPD, Osteoporosis, Tobacco use and DJD and spastic paraplegia    Hyperlipidemia Lab Results  Component Value Date/Time   LDLCALC 63 11/25/2019 10:52 AM   . Pharmacist Clinical Goal(s): o Over the next 60 days, patient will work with PharmD and providers to maintain LDL goal < 70 . Current regimen:  o Atorvastatin 20 mg daily . Interventions: o Provided diet and exercise counseling. . Patient self care activities - Over the next 60 days, patient will: o Follow heart healthy diet   Diabetes Lab Results  Component Value Date/Time   HGBA1C 8.6 (H) 11/25/2019 10:52 AM   HGBA1C 8.4 (H) 07/30/2019 10:52 AM   HGBA1C 7.6 (H) 03/30/2013 04:36 AM   . Pharmacist Clinical Goal(s): o Over the next 60 days, patient will work with PharmD and providers to achieve A1c goal <7% . Current regimen:  . Farxiga 10 mg qd--taking at bedtime . Metformin xr  500 mg 2 qam 2 qpm . Interventions: o Provided diet and exercise counseling. o Reviewed goal glucose readings for an A1c of <7%, we want to see fasting sugars <130 and 2 hour after meal sugars <180.  o Discussed sgins/symptoms of hypoglycemia o Discussed alternative therapy if Farxiga side effects are causing her to miss doses.  . Patient self care activities - Over the next 60 days, patient will: o Check blood sugar in the morning before eating or drinking and occasionally 2-3 hours after a meal document, and provide at future appointments o Contact provider with any episodes of hypoglycemia o Take  medication as prescribed or consider weekly GLP-1 injection.  Medication management . Pharmacist Clinical Goal(s): o Over the next 60 days, patient will work with PharmD and providers to achieve optimal medication adherence . Current pharmacy: [facility] . Interventions o Comprehensive medication review performed. o Continue current medication management strategy . Patient self care activities - Over the next 60 days, patient will: o Focus on medication adherence by fill dates o Take medications as prescribed o Report any questions or concerns to PharmD and/or provider(s)  Please see past updates related to this goal by clicking on the "Past Updates" button in the selected goal         The patient verbalized understanding of instructions, educational materials, and care plan provided today and agreed to receive a mailed copy of patient instructions, educational materials, and care plan.   Telephone follow up appointment with pharmacy team member scheduled for: 2 months  [place] Pod. Sherlene Rickel PharmD, BCPS Clinical Pharmacist [phone]  Living With Diabetes Diabetes (type 1 diabetes mellitus or type 2 diabetes mellitus) is a condition in which the body does not have enough of a hormone called insulin, or the body does not respond properly to insulin. Normally, insulin allows sugars (glucose) to enter cells in the body. The cells use glucose for energy. With diabetes, extra glucose builds up in the blood instead of going into cells, which results in high blood glucose (hyperglycemia). How to manage lifestyle  changes Managing diabetes includes medical treatments as well as lifestyle changes. If diabetes is not managed well, serious physical and emotional complications can occur. Taking good care of yourself means that you are responsible for:  Monitoring glucose regularly.  Eating a healthy diet.  Exercising regularly.  Meeting with health care providers.  Taking medicines as  directed. Some people may feel a lot of stress about managing their diabetes. This is known as emotional distress, and it is very common. Living with diabetes can place you at risk for emotional distress, depression, or anxiety. These disorders can be confusing and can make diabetes management more difficult. How to recognize stress Emotional distress Symptoms of emotional distress include:  Anger about having a diagnosis of diabetes.  Fear or frustration about your diagnosis and the changes you need to make to manage the condition.  Being overly worried about the care that you need or the cost of the care that you need.  Feeling like you caused your condition by doing something wrong.  Fear of unpredictable situations, like low or high blood glucose.  Feeling judged by your health care providers.  Feeling very alone with the disease.  Getting too tired or worn out with the demands of daily care. Depression Having diabetes means that you are at a higher risk for depression. Having depression also means that you are at a higher risk for diabetes. Your health care provider may test (screen) you for symptoms of depression. It is important to recognize depression symptoms and to start treatment for depression soon after it is diagnosed. The following are some symptoms of depression:  Loss of interest in things that you used to enjoy.  Trouble sleeping, or often waking up early and not being able to get back to sleep.  A change in appetite.  Feeling tired most of the day.  Feeling nervous and anxious.  Feeling guilty and worrying that you are a burden to others.  Feeling depressed more often than you do not feel that way.  Thoughts of hurting yourself or feeling that you want to die. If you have any of these symptoms for 2 weeks or longer, reach out to a health care provider. Follow these instructions at home: Managing emotional distress The following are some ways to manage  emotional distress:  Talk with your health care provider or certified diabetes educator. Consider working with a counselor or therapist.  Learn as much as you can about diabetes and its treatment. Meet with a certified diabetes educator or take a class to learn how to manage your condition.  Keep a journal of your thoughts and concerns.  Accept that some things are out of your control.  Talk with other people who have diabetes. It can help to talk with others about the emotional distress that you feel.  Find ways to manage stress that work for you. These may include art or music therapy, exercise, meditation, and hobbies.  Seek support from spiritual leaders, family, and friends. General instructions  Follow your diabetes management plan.  Keep all follow-up visits as told by your health care provider. This is important. Where to find support   Ask your health care provider to recommend a therapist who understands both depression and diabetes.  Search for information and support from the American Diabetes Association: www.diabetes.org  Find a certified diabetes educator and make an appointment through American Association of Diabetes Educators: www.diabeteseducator.org Get help right away if:  You have thoughts about hurting yourself or others.  If you ever feel like you may hurt yourself or others, or have thoughts about taking your own life, get help right away. You can go to your nearest emergency department or call:  Your local emergency services (911 in the [place]  A suicide crisis helpline, such as the [place] at (510)823-4142. This is open 24 hours a day. Summary  Diabetes (type 1 diabetes mellitus or type 2 diabetes mellitus) is a condition in which the body does not have enough of a hormone called insulin, or the body does not respond properly to insulin.  Living with diabetes puts you at risk for medical issues, and it also puts you at  risk for emotional issues such as emotional distress, depression, and anxiety.  Recognizing the symptoms of emotional distress and depression may help you avoid problems with your diabetes control. It is important to start treatment for emotional distress and depression soon after they are diagnosed.  Having diabetes means that you are at a higher risk for depression. Ask your health care provider to recommend a therapist who understands both depression and diabetes.  If you experience symptoms of emotional distress or depression, it is important to discuss this with your health care provider, certified diabetes educator, or therapist. This information is not intended to replace advice given to you by your health care provider. Make sure you discuss any questions you have with your health care provider. Document Revised: 02/17/2018 Document Reviewed: 06/21/2016 Elsevier Patient Education  [place].

## 2020-02-18 NOTE — Progress Notes (Signed)
Chronic Care Management Pharmacy  Name: Hannah [name]  MRN: [id number] DOB: [birth year]  Chief Complaint/ HPI  Hannah [name],  64 y.o. , female presents for their Follow-Up CCM visit with the clinical pharmacist via telephone.  PCP : Glean Hess, MD  Their chronic conditions include: Diabetes, COPD, Osteoporosis, Tobacco use and DJD and spastic paraplegia  Office Visits: 11/25/19-Dr. [facility] Melia - bloodwork, a1c 8.6% starting [employer] [place] Visit: 10/15/19- [facility] 2016- hip hardware exchange  Medications: Outpatient Encounter Medications as of 02/18/2020  Medication Sig Note  . albuterol (VENTOLIN HFA) 108 (90 Base) MCG/ACT inhaler INHALE 2 PUFFS BY MOUTH INTO THE LUNGS EVERY 6 HOURS AS NEEDED FOR WHEEZING OR SHORTNESS OF BREATH   . atorvastatin (LIPITOR) 20 MG tablet Take 1 tablet (20 mg total) by mouth daily.   . Blood Glucose Monitoring Suppl (ONETOUCH VERIO) w/Device KIT 1 each by Does not apply route 2 (two) times daily. Test BS twice daily.   . dapagliflozin propanediol (FARXIGA) 10 MG TABS tablet Take 1 tablet (10 mg total) by mouth daily before breakfast.   . diclofenac (FLECTOR) 1.3 % PTCH Flector 1.3 % transdermal 12 hour patch (Patient not taking: Reported on 01/04/2020)   . diclofenac sodium (VOLTAREN) 1 % GEL APP 4 GRAMS EXT AA QID UTD   . fluticasone (FLONASE) 50 MCG/ACT nasal spray Place 2 sprays into both nostrils daily.   . Fluticasone-Salmeterol (ADVAIR DISKUS) 250-50 MCG/DOSE AEPB Inhale 1 puff into the lungs 2 (two) times daily.   Marland Kitchen gabapentin (NEURONTIN) 600 MG tablet Take 1 tablet (600 mg total) by mouth 3 (three) times daily. 01/04/2020: Pt taking one tab in morning and 2 tabs at night  . glucose blood test strip Test BS twice daily. 01/04/2020: One touch verio  . metFORMIN (GLUCOPHAGE-XR) 500 MG 24 hr tablet TAKE 2 TABLETS BY MOUTH EVERY MORNING AND THEN TAKE 1 TABLET IN THE EVENING   . omeprazole (PRILOSEC) 20 MG capsule TAKE 1 CAPSULE BY MOUTH  twice a day (Patient taking differently: TAKE 1 CAPSULE BY MOUTH twice a day Taking qam)   . oxyCODONE-acetaminophen (PERCOCET) 10-325 MG tablet TK 1 T PO Q 4 HOURS PRN FOR PAIN. MAX OF 4 TS PER DAY   . pramipexole (MIRAPEX) 0.125 MG tablet Mirapex 0.125 mg tablet  1 to 2 in pm for RLS   . tiZANidine (ZANAFLEX) 4 MG tablet Take 4 mg by mouth every 6 (six) hours as needed.     No facility-administered encounter medications on file as of 02/18/2020.    Current Diagnosis/Assessment:    Goals Addressed   None      Diabetes   A1c goal <7%  Recent Relevant Labs: Lab Results  Component Value Date/Time   HGBA1C 8.6 (H) 11/25/2019 10:52 AM   HGBA1C 8.4 (H) 07/30/2019 10:52 AM   HGBA1C 7.6 (H) 03/30/2013 04:36 AM   MICROALBUR normal 04/29/2019 12:00 AM    BMP Latest Ref Rng & Units 11/25/2019 04/29/2019 01/28/2019  Glucose 65 - 99 mg/dL 150(H) 162(H) 175(H)  BUN 8 - 27 mg/dL '8 8 13  ' Creatinine 0.57 - 1.00 mg/dL 0.57 0.55(L) 0.57  BUN/Creat Ratio 12 - '28 14 15 23  ' Sodium 134 - 144 mmol/L 141 139 140  Potassium 3.5 - 5.2 mmol/L 4.4 4.5 4.6  Chloride 96 - 106 mmol/L 101 103 103  CO2 20 - 29 mmol/L '23 24 23  ' Calcium 8.7 - 10.3 mg/dL 9.7 9.7 9.8    Last diabetic Eye  exam: No results found for: HMDIABEYEEXA  Last diabetic Foot exam: No results found for: HMDIABFOOTEX   Checking BG: Daily  Recent FBG Readings: 140-150, 157 this am Recent preprandial BG 180-200  Patient has failed these meds in past: NA Patient is currently uncontrolled on the following medications: . Farxiga 10 mg qd--taking at bedtime . Metformin xr  500 mg 2 qam 1 with dinner --taking 2 bid   We discussed: Diet and exercise. Patient reports she stopped taking her [place] Glade over the Christmas Holiday while she was staying at her sister's due to urinary accidents. She states her BG was in the 180s and is coming down since she restarted Farxiga 12/27. She admits to dietary indiscretion over Christmas but has  returned to more veggies and proteins.   I do not see a B12 level in chart. Patient has no copay for prescriptions. We discussed adding GLP -1 RA if she is unable to consistently take [place].  Plan  Continue current medications. Recommend B12 level with next lab draws given long term metformin use. Consider adding weekly GLP-1.      Hyperlipidemia   LDL goal < 70  Lipid Panel     Component Value Date/Time   CHOL 134 11/25/2019 1052   TRIG 86 11/25/2019 1052   HDL 55 11/25/2019 1052   LDLCALC 63 11/25/2019 1052    Hepatic Function Latest Ref Rng & Units 11/25/2019 04/29/2019 01/28/2019  Total Protein 6.0 - 8.5 g/dL 6.8 6.7 6.3  Albumin 3.8 - 4.8 g/dL 4.6 4.4 4.3  AST 0 - 40 IU/L '11 13 13  ' ALT 0 - 32 IU/L '14 17 14  ' Alk Phosphatase 44 - 121 IU/L 64 76 102  Total Bilirubin 0.0 - 1.2 mg/dL 0.3 0.3 0.3     The 10-year ASCVD risk score Mikey Bussing DC Jr., et al., 2013) is: 5.7%   Values used to calculate the score:     Age: 9 years     Sex: Female     Is Non-Hispanic African American: No     Diabetic: Yes     Tobacco smoker: No     Systolic Blood Pressure: 219 mmHg     Is BP treated: No     HDL Cholesterol: 55 mg/dL     Total Cholesterol: 134 mg/dL   Patient has failed these meds in past: NA Patient is currently controlled on the following medications:  . Atorvastatin 20 mg qd  We discussed:  diet and exercise extensively. Patient maintains healthy diet  Plan  Continue current medications  Osteopenia / Osteoporosis   Last DEXA Scan: 2015,  Total hip-1.1 Forearm -1.3 10 year FRAX 11%   No results found for: VD25OH   Patient reports previously taken Prolia injections. Has a history of femur fracture and total knee replacement. Patient uses a walker.  Patient has failed these meds in past: NA Patient is currently query controlled on the following medications:  Multivitamin with calcium and vitamin d  We discussed:  Recommend [phone] units of vitamin D daily.  Recommend 1200 mg of calcium daily from dietary and supplemental sources. Patient restarted a multivitamin she had at home. We reviewed today it only contains 200 mg of Calcium and 200iu vitamin d. Encouraged patient to start taking additional Calcium 500 mg bid and Vitamin D 800 iu.  Plan  Recommend vitamin d level and  Follow up DEXA scan.   COPD/Emphysema   Last spirometry score: Not available in chart  Gold Grade: Unknown Current  COPD Classification:  A (low sx, <2 exacerbations/yr)  Lab Results  Component Value Date/Time   EOSPCT 1 07/21/2018 07:19 PM   EOSPCT 1.3 03/31/2013 06:14 AM   EOSABS 0.1 04/29/2019 10:44 AM   EOSABS 0.2 03/31/2013 06:14 AM    Patient has failed these meds in past: NA Patient is currently controlled on the following medications:  . Advair 250/50 1 puff bid---takes once daily . Albuterol 2 puffs q 6 h prn  Using maintenance inhaler regularly? Yes Frequency of rescue inhaler use:  daily  We discussed:  proper inhaler technique. Patient states she uses Advair only once daily most days. She is rinsing her mouth after using. Counseled that Advair is designed for bid dosing and encouraged patient to use as directed and see if she notices a difference in her breathing. Reports symptoms pretty well controlled and usually fluctuate seasonally.  Plan  Continue current medications. Consider spirometry testing.     Vaccines   Reviewed and discussed patient's vaccination history.    Immunization History  Administered Date(s) Administered  . Influenza, Seasonal, Injecte, Preservative Fre 11/29/2011  . Influenza,inj,Quad PF,6+ Mos 11/24/2014, 11/25/2015, 12/24/2016, 12/11/2017, 10/29/2018, 11/25/2019  . Influenza,inj,quad, With Preservative 03/08/2017  . PFIZER SARS-COV-2 Vaccination 05/18/2019, 06/10/2019  . Pneumococcal Conjugate-13 11/24/2014  . Pneumococcal Polysaccharide-23 05/20/2012  . Tdap 11/25/2019    Plan Reports she has gotten Covid  vaccine.  Medication Management   Pt uses [facility] for all medications Uses pill box? Yes Pt endorses 95 % compliance  We discussed: Discussed benefits of medication synchronization, packaging and delivery as well as enhanced pharmacist oversight with [employer].  Plan  Continue current medication management strategy    Follow up: 2 month phone visit  Junita Push. Kenton Kingfisher PharmD, [facility] [phone]

## 2020-02-19 ENCOUNTER — Other Ambulatory Visit: Payer: Self-pay | Admitting: Internal Medicine

## 2020-02-19 DIAGNOSIS — E118 Type 2 diabetes mellitus with unspecified complications: Secondary | ICD-10-CM

## 2020-02-19 MED ORDER — METFORMIN HCL ER 500 MG PO TB24: 1000.0000 mg | ORAL_TABLET | Freq: Two times a day (BID) | ORAL | 0 refills | Status: DC

## 2020-03-16 DIAGNOSIS — G2581 Restless legs syndrome: Secondary | ICD-10-CM | POA: Diagnosis not present

## 2020-03-16 DIAGNOSIS — G8929 Other chronic pain: Secondary | ICD-10-CM | POA: Diagnosis not present

## 2020-03-16 DIAGNOSIS — E1165 Type 2 diabetes mellitus with hyperglycemia: Secondary | ICD-10-CM | POA: Diagnosis not present

## 2020-03-16 DIAGNOSIS — E1142 Type 2 diabetes mellitus with diabetic polyneuropathy: Secondary | ICD-10-CM | POA: Diagnosis not present

## 2020-03-16 DIAGNOSIS — H5360 Unspecified night blindness: Secondary | ICD-10-CM | POA: Diagnosis not present

## 2020-03-16 DIAGNOSIS — E1136 Type 2 diabetes mellitus with diabetic cataract: Secondary | ICD-10-CM | POA: Diagnosis not present

## 2020-03-16 DIAGNOSIS — J439 Emphysema, unspecified: Secondary | ICD-10-CM | POA: Diagnosis not present

## 2020-03-16 DIAGNOSIS — G114 Hereditary spastic paraplegia: Secondary | ICD-10-CM | POA: Diagnosis not present

## 2020-03-16 DIAGNOSIS — E785 Hyperlipidemia, unspecified: Secondary | ICD-10-CM | POA: Diagnosis not present

## 2020-03-16 DIAGNOSIS — E1143 Type 2 diabetes mellitus with diabetic autonomic (poly)neuropathy: Secondary | ICD-10-CM | POA: Diagnosis not present

## 2020-03-30 ENCOUNTER — Telehealth: Payer: Self-pay | Admitting: Internal Medicine

## 2020-03-30 NOTE — Telephone Encounter (Signed)
Medication Refill - Medication: Lancets   Has the patient contacted their pharmacy? YesMaudie Mercury, from [facility], calling stating that there are no scripts for lancets on pts chart, but pt is requesting to have them refilled. Please advise. (Agent: If no, request that the patient contact the pharmacy for the refill.) (Agent: If yes, when and what did the pharmacy advise?)  Preferred Pharmacy (with phone number or street name):  [facility] [place], [place] [address]  [place] [address]  Phone: [phone] Fax: [phone]  Hours: Not open 24 hours     Agent: Please be advised that RX refills may take up to 3 business days. We ask that you follow-up with your pharmacy.

## 2020-03-31 ENCOUNTER — Other Ambulatory Visit: Payer: Self-pay

## 2020-03-31 DIAGNOSIS — E118 Type 2 diabetes mellitus with unspecified complications: Secondary | ICD-10-CM

## 2020-03-31 MED ORDER — ONETOUCH DELICA PLUS LANCET30G MISC: 1.0000 | Freq: Three times a day (TID) | 2 refills | Status: DC | PRN

## 2020-03-31 NOTE — Progress Notes (Signed)
One tou

## 2020-03-31 NOTE — Telephone Encounter (Signed)
Spoke to pt let her know that her lancets was sent in. Pt verbalized understanding.  KP

## 2020-03-31 NOTE — Telephone Encounter (Signed)
Pt called back in returning the office call  to provide the name of her meter. Pt says that it is a One Touch Ultra Mini. Pt is hoping to be able to pick up lancets today if possible.

## 2020-04-01 ENCOUNTER — Other Ambulatory Visit: Payer: Self-pay

## 2020-04-01 DIAGNOSIS — E118 Type 2 diabetes mellitus with unspecified complications: Secondary | ICD-10-CM

## 2020-04-01 MED ORDER — ONETOUCH DELICA PLUS LANCET30G MISC: 12 refills | Status: AC

## 2020-04-30 ENCOUNTER — Other Ambulatory Visit: Payer: Self-pay | Admitting: Internal Medicine

## 2020-04-30 DIAGNOSIS — J439 Emphysema, unspecified: Secondary | ICD-10-CM

## 2020-04-30 DIAGNOSIS — E118 Type 2 diabetes mellitus with unspecified complications: Secondary | ICD-10-CM

## 2020-04-30 NOTE — Telephone Encounter (Signed)
Requested Prescriptions  Pending Prescriptions Disp Refills  . ONETOUCH ULTRA test strip [employer] Med Name: [place] TEST ST(NEW)50] 200 strip 6    Sig: USE TO TEST BLOOD SUGAR TWICE DAILY     Endocrinology: Diabetes - Testing Supplies Passed - 04/30/2020  1:50 PM      Passed - Valid encounter within last 12 months    Recent Outpatient Visits          5 months ago Type II diabetes mellitus with complication [facility])   [facility] Glean Hess, MD   9 months ago Type II diabetes mellitus with complication [facility])   [facility] Glean Hess, MD   1 year ago Annual physical exam   [facility] Glean Hess, MD   1 year ago Type II diabetes mellitus with complication [facility])   [facility] Glean Hess, MD   1 year ago Yeast vaginitis   [facility] Glean Hess, MD

## 2020-04-30 NOTE — Telephone Encounter (Signed)
Requested Prescriptions  Pending Prescriptions Disp Refills  . albuterol (VENTOLIN HFA) 108 (90 Base) MCG/ACT inhaler [Pharmacy Med Name: ALBUTEROL HFA INH(200 PUFFS)18GM] 18 g 0    Sig: INHALE 2 PUFFS BY MOUTH INTO THE LUNGS EVERY 6 HOURS AS NEEDED FOR WHEEZING OR SHORTNESS OF BREATH     Pulmonology:  Beta Agonists Failed - 04/30/2020  2:17 PM      Failed - One inhaler should last at least one month. If the patient is requesting refills earlier, contact the patient to check for uncontrolled symptoms.      Passed - Valid encounter within last 12 months    Recent Outpatient Visits          5 months ago Type II diabetes mellitus with complication [facility])   [facility] Glean Hess, MD   9 months ago Type II diabetes mellitus with complication [facility])   [facility] Glean Hess, MD   1 year ago Annual physical exam   [facility] Glean Hess, MD   1 year ago Type II diabetes mellitus with complication [facility])   [facility] Glean Hess, MD   1 year ago Yeast vaginitis   [facility] Glean Hess, MD

## 2020-05-01 ENCOUNTER — Other Ambulatory Visit: Payer: Self-pay | Admitting: Internal Medicine

## 2020-05-01 DIAGNOSIS — J439 Emphysema, unspecified: Secondary | ICD-10-CM

## 2020-05-01 NOTE — Telephone Encounter (Signed)
Requested Prescriptions  Pending Prescriptions Disp Refills  . ADVAIR DISKUS 250-50 MCG/DOSE AEPB [Pharmacy Med Name: ADVAIR DISKUS 250/50MCG (YELLOW) 60] 60 each 0    Sig: INHALE 1 PUFF INTO THE LUNGS TWICE DAILY     Pulmonology:  Combination Products Passed - 05/01/2020  7:38 AM      Passed - Valid encounter within last 12 months    Recent Outpatient Visits          5 months ago Type II diabetes mellitus with complication [facility])   [facility] Glean Hess, MD   9 months ago Type II diabetes mellitus with complication [facility])   [facility] Glean Hess, MD   1 year ago Annual physical exam   [facility] Glean Hess, MD   1 year ago Type II diabetes mellitus with complication [facility])   [facility] Glean Hess, MD   1 year ago Yeast vaginitis   [facility] Glean Hess, MD

## 2020-05-03 ENCOUNTER — Encounter: Payer: Medicare HMO | Admitting: Internal Medicine

## 2020-05-18 ENCOUNTER — Other Ambulatory Visit: Payer: Self-pay | Admitting: Internal Medicine

## 2020-05-18 DIAGNOSIS — E118 Type 2 diabetes mellitus with unspecified complications: Secondary | ICD-10-CM

## 2020-05-22 ENCOUNTER — Other Ambulatory Visit: Payer: Self-pay | Admitting: Internal Medicine

## 2020-05-22 DIAGNOSIS — J439 Emphysema, unspecified: Secondary | ICD-10-CM

## 2020-05-22 NOTE — Telephone Encounter (Signed)
Requested Prescriptions  Pending Prescriptions Disp Refills  . albuterol (VENTOLIN HFA) 108 (90 Base) MCG/ACT inhaler [Pharmacy Med Name: ALBUTEROL HFA INH(200 PUFFS)18GM] 18 g 0    Sig: INHALE 2 PUFFS BY MOUTH EVERY 6 HOURS AS NEEDED FOR WHEEZING OR SHORTNESS OF BREATH     Pulmonology:  Beta Agonists Failed - 05/22/2020 12:39 PM      Failed - One inhaler should last at least one month. If the patient is requesting refills earlier, contact the patient to check for uncontrolled symptoms.      Passed - Valid encounter within last 12 months    Recent Outpatient Visits          5 months ago Type II diabetes mellitus with complication [facility])   [facility] Glean Hess, MD   9 months ago Type II diabetes mellitus with complication [facility])   [facility] Glean Hess, MD   1 year ago Annual physical exam   [facility] Glean Hess, MD   1 year ago Type II diabetes mellitus with complication [facility])   [facility] Glean Hess, MD   1 year ago Yeast vaginitis   [facility] Glean Hess, MD

## 2020-05-28 ENCOUNTER — Other Ambulatory Visit: Payer: Self-pay | Admitting: Internal Medicine

## 2020-05-28 DIAGNOSIS — R1310 Dysphagia, unspecified: Secondary | ICD-10-CM

## 2020-05-28 DIAGNOSIS — J3089 Other allergic rhinitis: Secondary | ICD-10-CM

## 2020-05-28 NOTE — Telephone Encounter (Signed)
Requested Prescriptions  Pending Prescriptions Disp Refills  . fluticasone (FLONASE) 50 MCG/ACT nasal spray [Pharmacy Med Name: FLUTICASONE 50MCG NASAL SP (120) RX] 16 g 6    Sig: SHAKE LIQUID AND USE 2 SPRAYS IN EACH NOSTRIL DAILY     Ear, Nose, and Throat: Nasal Preparations - Corticosteroids Passed - 05/28/2020  3:41 PM      Passed - Valid encounter within last 12 months    Recent Outpatient Visits          6 months ago Type II diabetes mellitus with complication [facility])   [facility]   10 months ago Type II diabetes mellitus with complication [facility])   [facility] Gle[facility], [facility]   1 year ago Annual physical exam   [facility] Glean [facility], MD   1 year ago Type II diabetes mellitus with complication [facility])   [facility], [facility]   1 year ago Yeast vaginitis   [facility]an [facility], [facility]             . FARXIGA 10 MG TABS tablet [Pharmacy Med Name: FARXIGA 10MG TABLETS] 30 tablet     Sig: TAKE 1 TABLET(10 MG) BY MOUTH DAILY BEFORE BREAKFAST     Endocrinology:  Diabetes - SGLT2 Inhibitors Failed - 05/28/2020  3:41 PM      Failed - LDL in normal range and within 360 days    LDL Chol Calc (NIH)  Date Value Ref Range Status  11/25/2019 63 0 - 99 mg/dL Final         Failed - HBA1C is between 0 and 7.9 and within 180 days    Hemoglobin A1C  Date Value Ref Range Status  03/30/2013 7.6 (H) 4.2 - 6.3 % Final    Comment:    The American Diabetes Association recommends that a primary goal of therapy should be <7% and that physicians should reevaluate the treatment regimen in patients with HbA1c values consistently >8%.    Hgb A1c MFr Bld  Date Value Ref Range Status  11/25/2019 8.6 (H) 4.8 - 5.6 % Final    Comment:             Prediabetes: 5.7 - 6.4          Diabetes: >6.4          Glycemic control for adults with diabetes: <7.0          Failed - Valid encounter within last 6  months    Recent Outpatient Visits          6 months ago Type II diabetes mellitus with complication [facility])   [facility]   10 months ago Type II diabetes mellitus with complication [facility])   [facility], [facility]   1 year ago Annual physical exam   [facility], [facility]   1 year ago Type II diabetes mellitus with complication [facility])   [facility]   1 year ago Yeast vaginitis   [facility]             Passed - Cr in normal range and within 360 days    Creatinine  Date Value Ref Range Status  03/31/2013 0.62 0.60 - 1.30 mg/dL Final   Creatinine, Ser  Date Value Ref Range Status  11/25/2019 0.57 0.57 -  1.00 mg/dL Final         Passed - eGFR in normal range and within 360 days    EGFR (African American)  Date Value Ref Range Status  03/31/2013 >60  Final   GFR calc Af Amer  Date Value Ref Range Status  11/25/2019 113 >59 mL/min/1.73 Final    Comment:    **[facility] currently reports eGFR in compliance with the current**   recommendations of the [employer]. [facility] will   update reporting as new guidelines are published from the NKF-ASN   Task force.    EGFR (Non-African Amer.)  Date Value Ref Range Status  03/31/2013 >60  Final    Comment:    eGFR values <38m/min/1.73 m2 may be an indication of chronic kidney disease (CKD). Calculated eGFR is useful in patients with stable renal function. The eGFR calculation will not be reliable in acutely ill patients when serum creatinine is changing rapidly. It is not useful in  patients on dialysis. The eGFR calculation may not be applicable to patients at the low and high extremes of body sizes, pregnant women, and vegetarians.    GFR calc non Af Amer  Date Value Ref Range Status  11/25/2019 98 >59 mL/min/1.73 Final

## 2020-05-28 NOTE — Telephone Encounter (Signed)
Requested medication (s) are due for refill today: yes  Requested medication (s) are on the active medication list: yes  Last refill:  11/27/19  Future visit scheduled: no  Notes to clinic:  overdue lab work   Requested Prescriptions  Pending Prescriptions Disp Refills   FARXIGA 10 MG TABS tablet [Pharmacy Med Name: FARXIGA 10MG TABLETS] 30 tablet     Sig: TAKE 1 TABLET(10 MG) BY MOUTH DAILY BEFORE BREAKFAST      Endocrinology:  Diabetes - SGLT2 Inhibitors Failed - 05/28/2020  3:41 PM      Failed - LDL in normal range and within 360 days    LDL Chol Calc (NIH)  Date Value Ref Range Status  11/25/2019 63 0 - 99 mg/dL Final          Failed - HBA1C is between 0 and 7.9 and within 180 days    Hemoglobin A1C  Date Value Ref Range Status  03/30/2013 7.6 (H) 4.2 - 6.3 % Final    Comment:    The American Diabetes Association recommends that a primary goal of therapy should be <7% and that physicians should reevaluate the treatment regimen in patients with HbA1c values consistently >8%.    Hgb A1c MFr Bld  Date Value Ref Range Status  11/25/2019 8.6 (H) 4.8 - 5.6 % Final    Comment:             Prediabetes: 5.7 - 6.4          Diabetes: >6.4          Glycemic control for adults with diabetes: <7.0           Failed - Valid encounter within last 6 months    Recent Outpatient Visits           6 months ago Type II diabetes mellitus with complication [facility])   [facility]   10 months ago Type II diabetes mellitus with complication [facility])   [facility]   1 year ago Annual physical exam   [facility] Glean [facility], MD   1 year ago Type II diabetes mellitus with complication [facility])   [facility]   1 year ago Yeast vaginitis   [facility], [facility]                Passed - Cr in normal range and within 360 days    Creatinine  Date Value Ref  Range Status  03/31/2013 0.62 0.60 - 1.30 mg/dL Final   Creatinine, Ser  Date Value Ref Range Status  11/25/2019 0.57 0.57 - 1.00 mg/dL Final          Passed - eGFR in normal range and within 360 days    EGFR (African American)  Date Value Ref Range Status  03/31/2013 >60  Final   GFR calc Af Amer  Date Value Ref Range Status  11/25/2019 113 >59 mL/min/1.73 Final    Comment:    **[facility] currently reports eGFR in compliance with the current**   recommendations of the [employer]. [facility] will   update reporting as new guidelines are published from the NKF-ASN   Task force.    EGFR (Non-African Amer.)  Date Value Ref Range Status  03/31/2013 >60  Final    Comment:    eGFR values <40m/min/1.73 m2 may be an indication of chronic kidney disease (CKD). Calculated eGFR  is useful in patients with stable renal function. The eGFR calculation will not be reliable in acutely ill patients when serum creatinine is changing rapidly. It is not useful in  patients on dialysis. The eGFR calculation may not be applicable to patients at the low and high extremes of body sizes, pregnant women, and vegetarians.    GFR calc non Af Amer  Date Value Ref Range Status  11/25/2019 98 >59 mL/min/1.73 Final           Signed Prescriptions Disp Refills   fluticasone (FLONASE) 50 MCG/ACT nasal spray 16 g 6    Sig: SHAKE LIQUID AND USE 2 SPRAYS IN EACH NOSTRIL DAILY      Ear, Nose, and Throat: Nasal Preparations - Corticosteroids Passed - 05/28/2020  3:41 PM      Passed - Valid encounter within last 12 months    Recent Outpatient Visits           6 months ago Type II diabetes mellitus with complication [facility])   [facility] Gle[facility], [facility]   10 months ago Type II diabetes mellitus with complication [facility])   [facility] Gle[facility], [facility]   1 year ago Annual physical exam   [facility], [facility]   1 year ago Type II diabetes  mellitus with complication [facility])   [facility]   1 year ago Yeast vaginitis   [facility] Glean Hess, MD

## 2020-05-28 NOTE — Telephone Encounter (Signed)
Requested Prescriptions  Pending Prescriptions Disp Refills  . omeprazole (PRILOSEC) 20 MG capsule [Pharmacy Med Name: OMEPRAZOLE 20MG  CAPSULES] 90 capsule 1    Sig: TAKE 1 CAPSULE BY MOUTH DAILY     Gastroenterology: Proton Pump Inhibitors Passed - 05/28/2020  4:46 PM      Passed - Valid encounter within last 12 months    Recent Outpatient Visits          6 months ago Type II diabetes mellitus with complication [facility])   [facility]   10 months ago Type II diabetes mellitus with complication [facility])   [facility] Glean Hess, MD   1 year ago Annual physical exam   [facility], [facility]   1 year ago Type II diabetes mellitus with complication [facility])   [facility]   1 year ago Yeast vaginitis   [facility] Glean Hess, MD

## 2020-05-30 NOTE — Telephone Encounter (Signed)
Left message to call back to set up appointment.

## 2020-05-30 NOTE — Telephone Encounter (Signed)
Please call pt to schedule an appt for DM.  KP

## 2020-06-01 ENCOUNTER — Telehealth: Payer: Self-pay

## 2020-06-01 NOTE — Telephone Encounter (Signed)
Noted. Dr. Army Melia has been removed from patients PCP list. Thank you.

## 2020-06-01 NOTE — Telephone Encounter (Unsigned)
Copied from [place] [id number]. Topic: General - Other >> Jun 01, 2020 12:07 PM Erick Blinks wrote: Reason for CRM: Pt called to report that she has a new PCP, Dr. Gale Journey (pt has [employer] insurance now)

## 2020-06-15 ENCOUNTER — Telehealth: Payer: Self-pay

## 2020-06-15 NOTE — Chronic Care Management (AMB) (Signed)
error 

## 2020-06-19 ENCOUNTER — Other Ambulatory Visit: Payer: Self-pay | Admitting: Internal Medicine

## 2020-06-19 DIAGNOSIS — E1169 Type 2 diabetes mellitus with other specified complication: Secondary | ICD-10-CM

## 2020-06-19 NOTE — Telephone Encounter (Signed)
Requested Prescriptions  Pending Prescriptions Disp Refills  . atorvastatin (LIPITOR) 20 MG tablet [Pharmacy Med Name: ATORVASTATIN 20MG  TABLETS] 180 tablet 1    Sig: TAKE 1 TABLET(20 MG) BY MOUTH DAILY     Cardiovascular:  Antilipid - Statins Passed - 06/19/2020  6:22 AM      Passed - Total Cholesterol in normal range and within 360 days    Cholesterol, Total  Date Value Ref Range Status  11/25/2019 134 100 - 199 mg/dL Final         Passed - LDL in normal range and within 360 days    LDL Chol Calc (NIH)  Date Value Ref Range Status  11/25/2019 63 0 - 99 mg/dL Final         Passed - HDL in normal range and within 360 days    HDL  Date Value Ref Range Status  11/25/2019 55 >39 mg/dL Final         Passed - Triglycerides in normal range and within 360 days    Triglycerides  Date Value Ref Range Status  11/25/2019 86 0 - 149 mg/dL Final         Passed - Patient is not pregnant      Passed - Valid encounter within last 12 months    Recent Outpatient Visits          6 months ago Type II diabetes mellitus with complication [facility])   [facility]   10 months ago Type II diabetes mellitus with complication [facility])   [facility] Glean [facility], MD   1 year ago Annual physical exam   [facility] Gle[facility], MD   1 year ago Type II diabetes mellitus with complication [facility])   [facility]   1 year ago Yeast vaginitis   [facility] Glean Hess, MD

## 2020-07-05 ENCOUNTER — Other Ambulatory Visit: Payer: Self-pay | Admitting: Internal Medicine

## 2020-07-05 DIAGNOSIS — E118 Type 2 diabetes mellitus with unspecified complications: Secondary | ICD-10-CM

## 2020-08-01 ENCOUNTER — Other Ambulatory Visit: Payer: Self-pay | Admitting: Internal Medicine

## 2020-08-01 DIAGNOSIS — J439 Emphysema, unspecified: Secondary | ICD-10-CM

## 2020-08-01 NOTE — Telephone Encounter (Signed)
Requested Prescriptions  Pending Prescriptions Disp Refills  . albuterol (VENTOLIN HFA) 108 (90 Base) MCG/ACT inhaler [Pharmacy Med Name: ALBUTEROL HFA INH(200 PUFFS)18GM] 18 g 0    Sig: INHALE 2 PUFFS BY MOUTH EVERY 6 HOURS AS NEEDED FOR WHEEZING OR SHORTNESS OF BREATH     Pulmonology:  Beta Agonists Failed - 08/01/2020  5:31 PM      Failed - One inhaler should last at least one month. If the patient is requesting refills earlier, contact the patient to check for uncontrolled symptoms.      Passed - Valid encounter within last 12 months    Recent Outpatient Visits          8 months ago Type II diabetes mellitus with complication [facility])   [facility]   1 year ago Type II diabetes mellitus with complication [facility])   [facility] Gle[facility], MD   1 year ago Annual physical exam   [facility] Glean Hess, MD   1 year ago Type II diabetes mellitus with complication [facility])   [facility] Gle[facility]   1 year ago Yeast vaginitis   [facility] Glean Hess, MD

## 2020-09-22 LAB — HM DIABETES EYE EXAM

## 2020-09-27 ENCOUNTER — Other Ambulatory Visit: Payer: Self-pay | Admitting: Internal Medicine

## 2020-09-27 DIAGNOSIS — J439 Emphysema, unspecified: Secondary | ICD-10-CM

## 2020-10-28 LAB — HM MAMMOGRAPHY

## 2020-11-17 ENCOUNTER — Other Ambulatory Visit: Payer: Self-pay | Admitting: Internal Medicine

## 2020-11-17 DIAGNOSIS — J439 Emphysema, unspecified: Secondary | ICD-10-CM

## 2020-11-17 NOTE — Telephone Encounter (Signed)
Albuterol Inhaler last ordered 08/01/20 with no refills. Approved per protocol.

## 2020-12-05 ENCOUNTER — Other Ambulatory Visit: Payer: Self-pay | Admitting: Internal Medicine

## 2020-12-05 DIAGNOSIS — R1310 Dysphagia, unspecified: Secondary | ICD-10-CM

## 2020-12-05 NOTE — Telephone Encounter (Signed)
Per chart- patient has new PCP Requested Prescriptions  Pending Prescriptions Disp Refills  . omeprazole (PRILOSEC) 20 MG capsule [Pharmacy Med Name: OMEPRAZOLE 20MG  CAPSULES] 90 capsule 1    Sig: TAKE 1 CAPSULE BY MOUTH DAILY     Gastroenterology: Proton Pump Inhibitors Failed - 12/05/2020 10:16 AM      Failed - Valid encounter within last 12 months    Recent Outpatient Visits          1 year ago Type II diabetes mellitus with complication [facility])   [facility], [facility]   1 year ago Type II diabetes mellitus with complication [facility])   [facility] Gle[facility], [facility]   1 year ago Annual physical exam   [facility] Glean Hess, MD   1 year ago Type II diabetes mellitus with complication [facility])   [facility]   1 year ago Yeast vaginitis   [facility] Glean Hess, MD

## 2020-12-19 LAB — HEMOGLOBIN A1C: Hemoglobin A1C: 6.6

## 2021-01-01 ENCOUNTER — Other Ambulatory Visit: Payer: Self-pay | Admitting: Internal Medicine

## 2021-01-01 DIAGNOSIS — J3089 Other allergic rhinitis: Secondary | ICD-10-CM

## 2021-01-01 NOTE — Telephone Encounter (Signed)
Requested Prescriptions  Refused Prescriptions Disp Refills  . fluticasone (FLONASE) 50 MCG/ACT nasal spray [Pharmacy Med Name: FLUTICASONE 50MCG NASAL SP (120) RX] 16 g 6    Sig: SHAKE LIQUID AND USE 2 SPRAYS IN EACH NOSTRIL DAILY     Ear, Nose, and Throat: Nasal Preparations - Corticosteroids Failed - 01/01/2021  8:25 AM      Failed - Valid encounter within last 12 months    Recent Outpatient Visits          1 year ago Type II diabetes mellitus with complication [facility]   [facility]   1 year ago Type II diabetes mellitus with complication [facility])   [facility] Gle[facility]   1 year ago Annual physical exam   [facility] Glean Hess, MD   1 year ago Type II diabetes mellitus with complication [facility])   [facility] Gle[facility]   1 year ago Yeast vaginitis   [facility] Glean Hess, MD

## 2021-01-04 ENCOUNTER — Ambulatory Visit: Payer: Medicare HMO

## 2021-01-31 ENCOUNTER — Other Ambulatory Visit: Payer: Self-pay | Admitting: Internal Medicine

## 2021-01-31 DIAGNOSIS — R1310 Dysphagia, unspecified: Secondary | ICD-10-CM

## 2021-01-31 NOTE — Telephone Encounter (Signed)
Requested medications are due for refill today.  yes  Requested medications are on the active medications list.  yes  Last refill. 05/28/2020  Future visit scheduled.   no  Notes to clinic.  Pt last seen 11/25/2019. Pt appears to have a new PCP. No PCP listed    Requested Prescriptions  Pending Prescriptions Disp Refills   omeprazole (PRILOSEC) 20 MG capsule [Pharmacy Med Name: OMEPRAZOLE 20MG  CAPSULES] 90 capsule 1    Sig: TAKE 1 CAPSULE BY MOUTH DAILY     Gastroenterology: Proton Pump Inhibitors Failed - 01/31/2021  3:54 PM      Failed - Valid encounter within last 12 months    Recent Outpatient Visits           1 year ago Type II diabetes mellitus with complication [facility])   [facility]   1 year ago Type II diabetes mellitus with complication [facility])   [facility] Gle[facility], [facility]   1 year ago Annual physical exam   [facility], [facility]   1 year ago Type II diabetes mellitus with complication [facility])   [facility] Glean [facility], [facility]   2 years ago Yeast vaginitis   [facility] Glean Hess, MD

## 2021-03-06 ENCOUNTER — Telehealth: Payer: Self-pay

## 2021-03-06 DIAGNOSIS — M75102 Unspecified rotator cuff tear or rupture of left shoulder, not specified as traumatic: Secondary | ICD-10-CM | POA: Diagnosis not present

## 2021-03-06 NOTE — Telephone Encounter (Signed)
Left voice mail/This patient changed PCP to [facility] in 2021.  She is no longer my patient.  Please let her know and cancel the appt.

## 2021-03-07 ENCOUNTER — Ambulatory Visit: Admitting: Internal Medicine

## 2021-04-03 DIAGNOSIS — Z96659 Presence of unspecified artificial knee joint: Secondary | ICD-10-CM | POA: Diagnosis not present

## 2021-04-03 DIAGNOSIS — M6530 Trigger finger, unspecified finger: Secondary | ICD-10-CM | POA: Diagnosis not present

## 2021-04-03 DIAGNOSIS — G2581 Restless legs syndrome: Secondary | ICD-10-CM | POA: Diagnosis not present

## 2021-04-03 DIAGNOSIS — M25569 Pain in unspecified knee: Secondary | ICD-10-CM | POA: Diagnosis not present

## 2021-04-03 DIAGNOSIS — G894 Chronic pain syndrome: Secondary | ICD-10-CM | POA: Diagnosis not present

## 2021-04-03 DIAGNOSIS — M19049 Primary osteoarthritis, unspecified hand: Secondary | ICD-10-CM | POA: Diagnosis not present

## 2021-04-03 DIAGNOSIS — Z79899 Other long term (current) drug therapy: Secondary | ICD-10-CM | POA: Diagnosis not present

## 2021-04-03 DIAGNOSIS — M25559 Pain in unspecified hip: Secondary | ICD-10-CM | POA: Diagnosis not present

## 2021-04-03 DIAGNOSIS — M4726 Other spondylosis with radiculopathy, lumbar region: Secondary | ICD-10-CM | POA: Diagnosis not present

## 2021-04-26 ENCOUNTER — Other Ambulatory Visit: Payer: Self-pay | Admitting: Internal Medicine

## 2021-04-26 DIAGNOSIS — E1169 Type 2 diabetes mellitus with other specified complication: Secondary | ICD-10-CM

## 2021-04-26 DIAGNOSIS — E785 Hyperlipidemia, unspecified: Secondary | ICD-10-CM

## 2021-04-26 NOTE — Telephone Encounter (Signed)
Requested medications are due for refill today.  yes ? ?Requested medications are on the active medications list.  yes ? ?Last refill. 06/19/2020 #180 1 refill ? ?Future visit scheduled.   yes ? ?Notes to clinic.  Failed refill protocol d/t expired labs. ? ? ? ?Requested Prescriptions  ?Pending Prescriptions Disp Refills  ? atorvastatin (LIPITOR) 20 MG tablet [Pharmacy Med Name: ATORVASTATIN '20MG'$  TABLETS] 180 tablet 1  ?  Sig: TAKE 1 TABLET BY MOUTH DAILY  ?  ? Cardiovascular:  Antilipid - Statins Failed - 04/26/2021  6:20 AM  ?  ?  Failed - Valid encounter within last 12 months  ?  Recent Outpatient Visits   ? ?      ? 1 year ago Type II diabetes mellitus with complication ([place])  ? [facility] Gle[facility], MD  ? 1 year ago Type II diabetes mellitus with complication [facility])  ? [facility] Gle[facility]  ? 1 year ago Annual physical exam  ? [facility] Gle[facility], MD  ? 2 years ago Type II diabetes mellitus with complication [facility])  ? [facility] Glean Hess, MD  ? 2 years ago Yeast vaginitis  ? [facility] Glean Hess, MD  ? ?  ?  ?Future Appointments   ? ?        ? In 1 month Army Melia Jesse Sans, MD [facility] [place]  ? ?  ? ?  ?  ?  Failed - Lipid Panel in normal range within the last 12 months  ?  Cholesterol, Total  ?Date Value Ref Range Status  ?11/25/2019 134 100 - 199 mg/dL Final  ? ?LDL Chol Calc (NIH)  ?Date Value Ref Range Status  ?11/25/2019 63 0 - 99 mg/dL Final  ? ?HDL  ?Date Value Ref Range Status  ?11/25/2019 55 >39 mg/dL Final  ? ?Triglycerides  ?Date Value Ref Range Status  ?11/25/2019 86 0 - 149 mg/dL Final  ? ?  ?  ?  Passed - Patient is not pregnant  ?  ?  ?  ?

## 2021-05-31 ENCOUNTER — Other Ambulatory Visit: Payer: Self-pay | Admitting: Internal Medicine

## 2021-05-31 DIAGNOSIS — E1169 Type 2 diabetes mellitus with other specified complication: Secondary | ICD-10-CM

## 2021-06-12 DIAGNOSIS — G894 Chronic pain syndrome: Secondary | ICD-10-CM | POA: Diagnosis not present

## 2021-06-12 DIAGNOSIS — Z79891 Long term (current) use of opiate analgesic: Secondary | ICD-10-CM | POA: Diagnosis not present

## 2021-06-12 DIAGNOSIS — M25551 Pain in right hip: Secondary | ICD-10-CM | POA: Diagnosis not present

## 2021-06-12 DIAGNOSIS — Z79899 Other long term (current) drug therapy: Secondary | ICD-10-CM | POA: Diagnosis not present

## 2021-06-12 DIAGNOSIS — M25561 Pain in right knee: Secondary | ICD-10-CM | POA: Diagnosis not present

## 2021-06-12 DIAGNOSIS — Z5181 Encounter for therapeutic drug level monitoring: Secondary | ICD-10-CM | POA: Diagnosis not present

## 2021-06-12 DIAGNOSIS — M19042 Primary osteoarthritis, left hand: Secondary | ICD-10-CM | POA: Diagnosis not present

## 2021-06-12 DIAGNOSIS — G2581 Restless legs syndrome: Secondary | ICD-10-CM | POA: Diagnosis not present

## 2021-06-12 DIAGNOSIS — M25562 Pain in left knee: Secondary | ICD-10-CM | POA: Diagnosis not present

## 2021-06-12 DIAGNOSIS — R202 Paresthesia of skin: Secondary | ICD-10-CM | POA: Diagnosis not present

## 2021-06-12 DIAGNOSIS — M4726 Other spondylosis with radiculopathy, lumbar region: Secondary | ICD-10-CM | POA: Diagnosis not present

## 2021-06-21 ENCOUNTER — Ambulatory Visit: Admitting: Internal Medicine

## 2021-06-23 ENCOUNTER — Ambulatory Visit (INDEPENDENT_AMBULATORY_CARE_PROVIDER_SITE_OTHER): Payer: Medicare HMO | Admitting: Internal Medicine

## 2021-06-23 ENCOUNTER — Encounter: Payer: Self-pay | Admitting: Internal Medicine

## 2021-06-23 VITALS — BP 122/72 | HR 80 | Ht 64.0 in | Wt 147.0 lb

## 2021-06-23 DIAGNOSIS — Z1159 Encounter for screening for other viral diseases: Secondary | ICD-10-CM

## 2021-06-23 DIAGNOSIS — J439 Emphysema, unspecified: Secondary | ICD-10-CM

## 2021-06-23 DIAGNOSIS — E1169 Type 2 diabetes mellitus with other specified complication: Secondary | ICD-10-CM | POA: Diagnosis not present

## 2021-06-23 DIAGNOSIS — E118 Type 2 diabetes mellitus with unspecified complications: Secondary | ICD-10-CM | POA: Diagnosis not present

## 2021-06-23 DIAGNOSIS — G114 Hereditary spastic paraplegia: Secondary | ICD-10-CM | POA: Diagnosis not present

## 2021-06-23 DIAGNOSIS — E785 Hyperlipidemia, unspecified: Secondary | ICD-10-CM | POA: Diagnosis not present

## 2021-06-23 DIAGNOSIS — R1310 Dysphagia, unspecified: Secondary | ICD-10-CM

## 2021-06-23 MED ORDER — JANUVIA 100 MG PO TABS: 100.0000 mg | ORAL_TABLET | Freq: Every day | ORAL | 1 refills | Status: DC

## 2021-06-23 MED ORDER — OMEPRAZOLE 20 MG PO CPDR: 20.0000 mg | DELAYED_RELEASE_CAPSULE | Freq: Two times a day (BID) | ORAL | 1 refills | Status: DC

## 2021-06-23 NOTE — Progress Notes (Signed)
? ? ?Date:  06/23/2021  ? ?Name:  Hannah [name]   DOB:  [birth year]   MRN:  [id number] ? ? ?Chief Complaint: Establish Care ? ?Diabetes ?She presents for her follow-up diabetic visit. She has type 2 diabetes mellitus. Pertinent negatives for hypoglycemia include no headaches or tremors. Pertinent negatives for diabetes include no chest pain, no fatigue, no polydipsia and no polyuria. Current diabetic treatment includes oral agent (dual therapy) (metformin and januvia). She is compliant with treatment all of the time. An ACE inhibitor/angiotensin II receptor blocker is not being taken.  ?Gastroesophageal Reflux ?She complains of dysphagia and heartburn. She reports no abdominal pain, no chest pain or no coughing. This is a recurrent problem. Pertinent negatives include no fatigue. She has tried a PPI for the symptoms.  ?COPD - stable symptoms on Advair every AM and Albuterol PRN.  Uses albuterol about 3 times per week. ? ?Lab Results  ?Component Value Date  ? NA 141 11/25/2019  ? K 4.4 11/25/2019  ? CO2 23 11/25/2019  ? GLUCOSE 150 (H) 11/25/2019  ? BUN 8 11/25/2019  ? CREATININE 0.57 11/25/2019  ? CALCIUM 9.7 11/25/2019  ? GFRNONAA 98 11/25/2019  ? ?Lab Results  ?Component Value Date  ? CHOL 134 11/25/2019  ? HDL 55 11/25/2019  ? [place] 63 11/25/2019  ? TRIG 86 11/25/2019  ? CHOLHDL 2.4 11/25/2019  ? ?Lab Results  ?Component Value Date  ? TSH 1.530 04/29/2019  ? ?Lab Results  ?Component Value Date  ? HGBA1C 6.6 12/19/2020  ? ?Lab Results  ?Component Value Date  ? WBC 11.2 (H) 04/29/2019  ? HGB 15.5 04/29/2019  ? HCT 45.0 04/29/2019  ? MCV 95 04/29/2019  ? PLT 267 04/29/2019  ? ?Lab Results  ?Component Value Date  ? ALT 14 11/25/2019  ? AST 11 11/25/2019  ? ALKPHOS 64 11/25/2019  ? BILITOT 0.3 11/25/2019  ? ?No results found for: 25OHVITD2, [place], VD25OH  ? ?Review of Systems  ?Constitutional:  Negative for appetite change, fatigue, fever and unexpected weight change.  ?HENT:  Negative for tinnitus and trouble  swallowing.   ?Eyes:  Negative for visual disturbance.  ?Respiratory:  Negative for cough, chest tightness and shortness of breath.   ?Cardiovascular:  Negative for chest pain, palpitations and leg swelling.  ?Gastrointestinal:  Positive for dysphagia and heartburn. Negative for abdominal pain.  ?Endocrine: Negative for polydipsia and polyuria.  ?Genitourinary:  Negative for dysuria and hematuria.  ?Musculoskeletal:  Negative for arthralgias.  ?Neurological:  Negative for tremors, numbness and headaches.  ?Psychiatric/Behavioral:  Negative for dysphoric mood.   ? ?Patient Active Problem List  ? Diagnosis Date Noted  ? 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 Special screening for malignant neoplasms, colon   ? Benign neoplasm of descending colon   ? Other osteoporosis without current pathological fracture 11/25/2015  ? Hyperlipidemia associated with type 2 diabetes mellitus ([place]) 03/28/2015  ? Dysphagia 11/11/2014  ? 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 Type II diabetes mellitus with complication ([place]) 83/33/8329  ? 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 Degenerative joint disease involving multiple joints 09/28/2013  ? ? ?Allergies  ?Allergen Reactions  ? Levofloxacin Nausea And Vomiting  ? ? ?Past Surgical History:  ?Procedure Laterality Date  ? COLONOSCOPY WITH PROPOFOL N/A 03/05/2016  ? Procedure: COLONOSCOPY WITH PROPOFOL;  Surgeon: [name], MD;  Location: [facility];  Service: Endoscopy;  Laterality:  N/A;  ? HIP FRACTURE SURGERY Right 03/2013  ? INNER EAR SURGERY Bilateral   ? REPLACEMENT TOTAL KNEE Right 02/2015  ? SHOULDER SURGERY Bilateral   ? rotator cuff  ? ? ?Social History  ? ?Tobacco Use  ? Smoking status: Former  ?  Packs/day: 0.50  ?  Years: 25.00  ?  Pack years: 12.50  ?  Types: Cigarettes  ?  Quit date: 03/05/2016  ?  Years since quitting: 5.3  ?  Smokeless tobacco: Never  ?Vaping Use  ? Vaping Use: Never used  ?Substance Use Topics  ? Alcohol use: No  ? Drug use: No  ? ? ? ?Medication list has been reviewed and updated. ? ?Current Meds  ?Medication Sig  ? ADVAIR DISKUS 250-50 MCG/ACT AEPB INHALE 1 PUFF INTO THE LUNGS TWICE DAILY  ? albuterol (VENTOLIN HFA) 108 (90 Base) MCG/ACT inhaler INHALE 2 PUFFS BY MOUTH EVERY 6 HOURS AS NEEDED FOR WHEEZING OR SHORTNESS OF BREATH  ? atorvastatin (LIPITOR) 20 MG tablet TAKE 1 TABLET(20 MG) BY MOUTH DAILY  ? Blood Glucose Monitoring Suppl (ONETOUCH VERIO) w/Device KIT 1 each by Does not apply route 2 (two) times daily. Test BS twice daily.  ? diclofenac sodium (VOLTAREN) 1 % GEL APP 4 GRAMS EXT AA QID UTD  ? fluticasone (FLONASE) 50 MCG/ACT nasal spray SHAKE LIQUID AND USE 2 SPRAYS IN EACH NOSTRIL DAILY  ? gabapentin (NEURONTIN) 600 MG tablet Take 1 tablet (600 mg total) by mouth 3 (three) times daily. (Patient taking differently: Take 600 mg by mouth in the morning, at noon, in the evening, and at bedtime. Patient takes 2 qam and 2 qpm)  ? JANUVIA 100 MG tablet Take 100 mg by mouth daily.  ? Lancets (ONETOUCH DELICA PLUS LANCET30G) MISC Use up to 4 times daily to test blood sugar as needed  ? metFORMIN (GLUCOPHAGE-XR) 500 MG 24 hr tablet TAKE 2 TABLETS BY MOUTH EVERY MORNING THEN TAKE 1 TABLET BY MOUTH IN THE EVENING (Patient taking differently: Take 1,000 mg by mouth in the morning and at bedtime.)  ? omeprazole (PRILOSEC) 20 MG capsule Take by mouth.  ? ONETOUCH ULTRA test strip USE TO TEST BLOOD SUGAR TWICE DAILY  ? oxyCODONE-acetaminophen (PERCOCET) 10-325 MG tablet Take 1 tablet by mouth every 4 (four) hours as needed.  ? pramipexole (MIRAPEX) 0.125 MG tablet Mirapex 0.125 mg tablet ? 1 to 2 in pm for RLS  ? tiZANidine (ZANAFLEX) 4 MG tablet Take 4 mg by mouth every 6 (six) hours as needed.   ? ? ? ?  06/23/2021  ?  3:02 PM 11/25/2019  ? 10:05 AM 07/30/2019  ? 10:16 AM  ?GAD 7 : Generalized Anxiety Score  ?Nervous,  Anxious, on Edge 0 2 0  ?Control/stop worrying 0 0 0  ?Worry too much - different things 0 0 0  ?Trouble relaxing 0 0 1  ?Restless 0 0 0  ?Easily annoyed or irritable 0 0 0  ?Afraid - awful might happen 0 0 0  ?Total GAD 7 Score 0 2 1  ?Anxiety Difficulty  Not difficult at all Not difficult at all  ? ? ? ?  06/23/2021  ?  3:01 PM  ?Depression screen PHQ 2/9  ?Decreased Interest 1  ?Down, Depressed, Hopeless 0  ?PHQ - 2 Score 1  ?Altered sleeping 1  ?Tired, decreased energy 1  ?Change in appetite 1  ?Feeling bad or failure about yourself  1  ?Trouble concentrating 0  ?Moving slowly or fidgety/restless   2  ?Suicidal thoughts 0  ?PHQ-9 Score 7  ?Difficult doing work/chores Not difficult at all  ? ? ?BP Readings from Last 3 Encounters:  ?06/23/21 122/72  ?01/04/20 120/66  ?11/25/19 118/62  ? ? ?Physical Exam ?Vitals and nursing note reviewed.  ?Constitutional:   ?   General: She is not in acute distress. ?   Appearance: She is well-developed.  ?HENT:  ?   Head: Normocephalic and atraumatic.  ?Pulmonary:  ?   Effort: Pulmonary effort is normal. No respiratory distress.  ?Skin: ?   General: Skin is warm and dry.  ?   Findings: No rash.  ?Neurological:  ?   Mental Status: She is alert and oriented to person, place, and time.  ?   Gait: Gait abnormal.  ?   Comments: Uses rolator, gait slow and antalgic  ?Psychiatric:     ?   Mood and Affect: Mood normal.     ?   Behavior: Behavior normal.  ? ? ?Wt Readings from Last 3 Encounters:  ?06/23/21 147 lb (66.7 kg)  ?01/04/20 132 lb 6.4 oz (60.1 kg)  ?11/25/19 140 lb (63.5 kg)  ? ? ?BP 122/72   Pulse 80   Ht 5' 4" (1.626 m)   Wt 147 lb (66.7 kg)   SpO2 95%   BMI 25.23 kg/m?  ? ?Assessment and Plan: ?1. Type II diabetes mellitus with complication (HCC) ?Resume Januvia and continue metformin. ?Will check labs, eye exam ?- JANUVIA 100 MG tablet; Take 1 tablet (100 mg total) by mouth daily.  Dispense: 90 tablet; Refill: 1 ?- Microalbumin / creatinine urine ratio ?- Hemoglobin A1c ?-  Comprehensive metabolic panel ? ?2. Hyperlipidemia associated with type 2 diabetes mellitus (HCC) ?On statin therapy ? ?3. Dysphagia, unspecified type ?Recent worsening due to being out of omeprazole. ?Will resume and

## 2021-06-24 LAB — COMPREHENSIVE METABOLIC PANEL
ALT: 18 IU/L (ref 0–32)
AST: 13 IU/L (ref 0–40)
Albumin/Globulin Ratio: 1.7 (ref 1.2–2.2)
Albumin: 4.3 g/dL (ref 3.8–4.8)
Alkaline Phosphatase: 60 IU/L (ref 44–121)
BUN/Creatinine Ratio: 17 (ref 12–28)
BUN: 10 mg/dL (ref 8–27)
Bilirubin Total: <0.2 mg/dL (ref 0.0–1.2)
CO2: 22 mmol/L (ref 20–29)
Calcium: 9.2 mg/dL (ref 8.7–10.3)
Chloride: 103 mmol/L (ref 96–106)
Creatinine, Ser: 0.59 mg/dL (ref 0.57–1.00)
Globulin, Total: 2.5 g/dL (ref 1.5–4.5)
Glucose: 131 mg/dL — ABNORMAL HIGH (ref 70–99)
Potassium: 3.9 mmol/L (ref 3.5–5.2)
Sodium: 140 mmol/L (ref 134–144)
Total Protein: 6.8 g/dL (ref 6.0–8.5)
eGFR: 100 mL/min/{1.73_m2} (ref >59)

## 2021-06-24 LAB — CBC WITH DIFFERENTIAL/PLATELET
Basophils Absolute: 0.1 10*3/uL (ref 0.0–0.2)
Basos: 1 %
EOS (ABSOLUTE): 0.2 10*3/uL (ref 0.0–0.4)
Eos: 2 %
Hematocrit: 42.4 % (ref 34.0–46.6)
Hemoglobin: 14.8 g/dL (ref 11.1–15.9)
Immature Grans (Abs): 0 10*3/uL (ref 0.0–0.1)
Immature Granulocytes: 0 %
Lymphocytes Absolute: 4.8 10*3/uL — ABNORMAL HIGH (ref 0.7–3.1)
Lymphs: 40 %
MCH: 32.5 pg (ref 26.6–33.0)
MCHC: 34.9 g/dL (ref 31.5–35.7)
MCV: 93 fL (ref 79–97)
Monocytes Absolute: 0.8 10*3/uL (ref 0.1–0.9)
Monocytes: 7 %
Neutrophils Absolute: 6.1 10*3/uL (ref 1.4–7.0)
Neutrophils: 50 %
Platelets: 224 10*3/uL (ref 150–450)
RBC: 4.56 x10E6/uL (ref 3.77–5.28)
RDW: 11.8 % (ref 11.7–15.4)
WBC: 12 10*3/uL — ABNORMAL HIGH (ref 3.4–10.8)

## 2021-06-24 LAB — HEMOGLOBIN A1C
Est. average glucose Bld gHb Est-mCnc: 169 mg/dL
Hgb A1c MFr Bld: 7.5 % — ABNORMAL HIGH (ref 4.8–5.6)

## 2021-06-24 LAB — MICROALBUMIN / CREATININE URINE RATIO
Creatinine, Urine: 38.3 mg/dL
Microalb/Creat Ratio: <8 mg/g creat (ref 0–29)
Microalbumin, Urine: <3 ug/mL

## 2021-06-24 LAB — HEPATITIS C ANTIBODY: Hep C Virus Ab: NONREACTIVE

## 2021-07-04 ENCOUNTER — Telehealth: Payer: Self-pay | Admitting: Internal Medicine

## 2021-07-04 NOTE — Telephone Encounter (Signed)
Copied from [place] [id number]. Topic: Medicare AWV ?>> Jul 04, 2021  1:17 PM Cher Nakai R wrote: ?Reason for CRM:  ?Left message for patient to call back and schedule Medicare Annual Wellness Visit (AWV) in office.  ? ?If unable to come into the office for AWV,  please offer to do virtually or by telephone. ? ?Last AWV: 01/04/2020 ? ?Please schedule at anytime with [facility] Advisor.     ? ?30 minute appointment for Virtual or phone ?45 minute appointment for in office or Initial virtual/phone ? ?Any questions, please call me at [phone] ?

## 2021-07-24 ENCOUNTER — Other Ambulatory Visit: Payer: Self-pay

## 2021-07-24 ENCOUNTER — Ambulatory Visit (INDEPENDENT_AMBULATORY_CARE_PROVIDER_SITE_OTHER)

## 2021-07-24 ENCOUNTER — Telehealth: Payer: Self-pay

## 2021-07-24 DIAGNOSIS — Z780 Asymptomatic menopausal state: Secondary | ICD-10-CM

## 2021-07-24 DIAGNOSIS — Z0000 Encounter for general adult medical examination without abnormal findings: Secondary | ICD-10-CM | POA: Diagnosis not present

## 2021-07-24 DIAGNOSIS — Z1211 Encounter for screening for malignant neoplasm of colon: Secondary | ICD-10-CM | POA: Diagnosis not present

## 2021-07-24 DIAGNOSIS — Z86010 Personal history of colonic polyps: Secondary | ICD-10-CM

## 2021-07-24 DIAGNOSIS — Z1231 Encounter for screening mammogram for malignant neoplasm of breast: Secondary | ICD-10-CM

## 2021-07-24 MED ORDER — PEG 3350-KCL-NA BICARB-NACL 420 G PO SOLR: 4000.0000 mL | Freq: Once | ORAL | 0 refills | Status: AC

## 2021-07-24 NOTE — Telephone Encounter (Signed)
Gastroenterology Pre-Procedure Review  Request Date: 09/18/21 Requesting Physician: Dr. Allen Norris  PATIENT REVIEW QUESTIONS: The patient responded to the following health history questions as indicated:    1. Are you having any GI issues? no 2. Do you have a personal history of Polyps? yes (03/05/16 sessile polyps were noted on colonoscopy performed by Dr. Allen Norris    ) 3. Do you have a family history of Colon Cancer or Polyps? yes (mother colon cancer past away at 71, (2) sisters and (1) polyps) 4. Diabetes Mellitus? yes 5. Joint replacements in the past 12 months?no 6. Major health problems in the past 3 months?no 7. Any artificial heart valves, MVP, or defibrillator?no    MEDICATIONS & ALLERGIES:    Patient reports the following regarding taking any anticoagulation/antiplatelet therapy:   Plavix, Coumadin, Eliquis, Xarelto, Lovenox, Pradaxa, Brilinta, or Effient? no Aspirin? no  Patient confirms/reports the following medications:  Current Outpatient Medications  Medication Sig Dispense Refill   ADVAIR DISKUS 250-50 MCG/ACT AEPB INHALE 1 PUFF INTO THE LUNGS TWICE DAILY 60 each 0   albuterol (VENTOLIN HFA) 108 (90 Base) MCG/ACT inhaler INHALE 2 PUFFS BY MOUTH EVERY 6 HOURS AS NEEDED FOR WHEEZING OR SHORTNESS OF BREATH 18 g 0   atorvastatin (LIPITOR) 20 MG tablet TAKE 1 TABLET(20 MG) BY MOUTH DAILY 180 tablet 1   Blood Glucose Monitoring Suppl (ONETOUCH VERIO) w/Device KIT 1 each by Does not apply route 2 (two) times daily. Test BS twice daily. 1 kit 0   diclofenac sodium (VOLTAREN) 1 % GEL APP 4 GRAMS EXT AA QID UTD  3   fluticasone (FLONASE) 50 MCG/ACT nasal spray SHAKE LIQUID AND USE 2 SPRAYS IN EACH NOSTRIL DAILY 16 g 6   gabapentin (NEURONTIN) 600 MG tablet Take 1 tablet (600 mg total) by mouth 3 (three) times daily. (Patient taking differently: Take 600 mg by mouth in the morning, at noon, in the evening, and at bedtime. Patient takes 2 qam and 2 qpm) 90 tablet 5   JANUVIA 100 MG tablet  Take 1 tablet (100 mg total) by mouth daily. 90 tablet 1   Lancets (ONETOUCH DELICA PLUS [id number]) MISC Use up to 4 times daily to test blood sugar as needed 100 each 12   metFORMIN (GLUCOPHAGE-XR) 500 MG 24 hr tablet TAKE 2 TABLETS BY MOUTH EVERY MORNING THEN TAKE 1 TABLET BY MOUTH IN THE EVENING (Patient taking differently: Take 1,000 mg by mouth in the morning and at bedtime.) 270 tablet 1   omeprazole (PRILOSEC) 20 MG capsule Take 1 capsule (20 mg total) by mouth 2 (two) times daily before a meal. 90 capsule 1   ONETOUCH ULTRA test strip USE TO TEST BLOOD SUGAR TWICE DAILY 200 strip 6   oxyCODONE-acetaminophen (PERCOCET) 10-325 MG tablet Take 1 tablet by mouth every 4 (four) hours as needed.  0   pramipexole (MIRAPEX) 0.125 MG tablet Mirapex 0.125 mg tablet  1 to 2 in pm for RLS     tiZANidine (ZANAFLEX) 4 MG tablet Take 4 mg by mouth every 6 (six) hours as needed.      No current facility-administered medications for this visit.    Patient confirms/reports the following allergies:  Allergies  Allergen Reactions   Levofloxacin Nausea And Vomiting    No orders of the defined types were placed in this encounter.   AUTHORIZATION INFORMATION Primary Insurance: 1D#: Group #:  Secondary Insurance: 1D#: Group #:  SCHEDULE INFORMATION: Date: 09/18/21 Time: Location: [place]

## 2021-07-24 NOTE — Progress Notes (Signed)
Subjective:   Hannah [name] is a 66 y.o. female who presents for Medicare Annual (Subsequent) preventive examination.  Virtual Visit via Telephone Note  I connected with  Hannah [name] on 07/24/21 at  8:15 AM EDT by telephone and verified that I am speaking with the correct person using two identifiers.  Location: Patient: home Provider: [facility] Persons participating in the virtual visit: [place]   I discussed the limitations, risks, security and privacy concerns of performing an evaluation and management service by telephone and the availability of in person appointments. The patient expressed understanding and agreed to proceed.  Interactive audio and video telecommunications were attempted between this nurse and patient, however failed, due to patient having technical difficulties OR patient did not have access to video capability.  We continued and completed visit with audio only.  Some vital signs may be absent or patient reported.   Clemetine Marker, LPN   Review of Systems     Cardiac Risk Factors include: advanced age (>40mn, >>75women);diabetes mellitus;sedentary lifestyle     Objective:    Today's Vitals   07/24/21 0819  PainSc: 9    There is no height or weight on file to calculate BMI.     07/24/2021    8:31 AM 01/04/2020   10:24 AM 12/29/2018   10:16 AM 07/21/2018    7:10 PM 07/19/2018    8:15 PM 12/25/2017    2:31 PM 12/24/2016    2:58 PM  Advanced Directives  Does Patient Have a Medical Advance Directive? No No No No No No No  Would patient like information on creating a medical advance directive? No - Patient declined Yes (MAU/Ambulatory/Procedural Areas - Information given) Yes (MAU/Ambulatory/Procedural Areas - Information given) No - Patient declined  Yes (MAU/Ambulatory/Procedural Areas - Information given) No - Patient declined    Current Medications (verified) Outpatient Encounter Medications as of 07/24/2021  Medication Sig   ADVAIR  DISKUS 250-50 MCG/ACT AEPB INHALE 1 PUFF INTO THE LUNGS TWICE DAILY   albuterol (VENTOLIN HFA) 108 (90 Base) MCG/ACT inhaler INHALE 2 PUFFS BY MOUTH EVERY 6 HOURS AS NEEDED FOR WHEEZING OR SHORTNESS OF BREATH   atorvastatin (LIPITOR) 20 MG tablet TAKE 1 TABLET(20 MG) BY MOUTH DAILY   Blood Glucose Monitoring Suppl (ONETOUCH VERIO) w/Device KIT 1 each by Does not apply route 2 (two) times daily. Test BS twice daily.   diclofenac sodium (VOLTAREN) 1 % GEL APP 4 GRAMS EXT AA QID UTD   fluticasone (FLONASE) 50 MCG/ACT nasal spray SHAKE LIQUID AND USE 2 SPRAYS IN EACH NOSTRIL DAILY   gabapentin (NEURONTIN) 600 MG tablet Take 1 tablet (600 mg total) by mouth 3 (three) times daily. (Patient taking differently: Take 600 mg by mouth in the morning, at noon, in the evening, and at bedtime. Patient takes 2 qam and 2 qpm)   JANUVIA 100 MG tablet Take 1 tablet (100 mg total) by mouth daily.   Lancets (ONETOUCH DELICA PLUS [id number] MISC Use up to 4 times daily to test blood sugar as needed   metFORMIN (GLUCOPHAGE-XR) 500 MG 24 hr tablet TAKE 2 TABLETS BY MOUTH EVERY MORNING THEN TAKE 1 TABLET BY MOUTH IN THE EVENING (Patient taking differently: Take 1,000 mg by mouth in the morning and at bedtime.)   omeprazole (PRILOSEC) 20 MG capsule Take 1 capsule (20 mg total) by mouth 2 (two) times daily before a meal.   ONETOUCH ULTRA test strip USE TO TEST BLOOD SUGAR TWICE DAILY  oxyCODONE-acetaminophen (PERCOCET) 10-325 MG tablet Take 1 tablet by mouth every 4 (four) hours as needed.   pramipexole (MIRAPEX) 0.125 MG tablet Mirapex 0.125 mg tablet  1 to 2 in pm for RLS   tiZANidine (ZANAFLEX) 4 MG tablet Take 4 mg by mouth every 6 (six) hours as needed.    [DISCONTINUED] metFORMIN (GLUCOPHAGE) 500 MG tablet Take 2 tablets by mouth 2 (two) times daily.   No facility-administered encounter medications on file as of 07/24/2021.    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)    GERD (gastroesophageal reflux disease)    Hyperlipidemia    Mood disorder ([place]) 04/29/2019   Past Surgical History:  Procedure Laterality Date   COLONOSCOPY WITH PROPOFOL N/A 03/05/2016   Procedure: COLONOSCOPY WITH PROPOFOL;  Surgeon: [name], MD;  Location: [place];  Service: Endoscopy;  Laterality: N/A;   HIP FRACTURE SURGERY Right 03/2013   INNER EAR SURGERY Bilateral    REPLACEMENT TOTAL KNEE Right 02/2015   SHOULDER SURGERY Bilateral    rotator cuff   Family History  Problem Relation Age of Onset   Cervical cancer Mother    Colon cancer Mother    Diabetes Father    Heart disease Father    Breast cancer Sister 34   Breast cancer Cousin 23       pat cousin   Diabetes Sister    Diabetes Sister    Congestive Heart Failure Paternal Grandmother    Diabetes Paternal Grandmother    Social History   Socioeconomic History   Marital status: Widowed    Spouse name: Not on file   Number of children: 2   Years of education: Not on file   Highest education level: Some college, no degree  Occupational History   Occupation: retired  Tobacco Use   Smoking status: Former    Packs/day: 0.50    Years: 25.00    Pack years: 12.50    Types: Cigarettes    Quit date: 03/05/2016    Years since quitting: 5.3   Smokeless tobacco: Never  Vaping Use   Vaping Use: Never used  Substance and Sexual Activity   Alcohol use: No   Drug use: No   Sexual activity: Never  Other Topics Concern   Not on file  Social History Narrative   Pt's son lives with her; he is recovering from TBI due to motorcycle accident 09/10/19   Social Determinants of Health   Financial Resource Strain: Low Risk    Difficulty of Paying Living Expenses: Not hard at all  Food Insecurity: No Food Insecurity    Worried About Charity fundraiser in the Last Year: Never true   Arboriculturist in the Last Year: Never true  Transportation Needs: No Transportation Needs   Lack of Transportation (Medical): No   Lack of Transportation (Non-Medical): No  Physical Activity: Inactive   Days of Exercise per Week: 0 days   Minutes of Exercise per Session: 0 min  Stress: No Stress Concern Present   Feeling of Stress : Not at all  Social Connections: Socially Isolated   Frequency of Communication with Friends and Family: More than three times a week   Frequency of Social Gatherings with Friends and Family: More  than three times a week   Attends Religious Services: Never   Active Member of Clubs or Organizations: No   Attends Archivist Meetings: Never   Marital Status: Widowed    Tobacco Counseling Counseling given: Not Answered   Clinical Intake:  Pre-visit preparation completed: Yes  Pain : 0-10 Pain Score: 9  Pain Type: Chronic pain Pain Location: Knee Pain Orientation: Left Pain Descriptors / Indicators: Aching, Sore Pain Onset: More than a month ago Pain Frequency: Constant     Nutritional Risks: None Diabetes: No  How often do you need to have someone help you when you read instructions, pamphlets, or other written materials from your doctor or pharmacy?: 1 - Never  Nutrition Risk Assessment:  Has the patient had any N/V/D within the last 2 months?  No  Does the patient have any non-healing wounds?  No  Has the patient had any unintentional weight loss or weight gain?  No   Diabetes:  Is the patient diabetic?  Yes  If diabetic, was a CBG obtained today?  No  Did the patient bring in their glucometer from home?  No  How often do you monitor your CBG's? 3 times per week.   Financial Strains and Diabetes Management:  Are you having any financial strains with the device, your supplies or your medication? No .  Does the patient want to be seen by Chronic Care  Management for management of their diabetes?  No  Would the patient like to be referred to a Nutritionist or for Diabetic Management?  No   Diabetic Exams:  Diabetic Eye Exam: Completed 09/22/20.   Diabetic Foot Exam: Completed 06/23/21.   Interpreter Needed?: No  Information entered by :: Clemetine Marker LPN   Activities of Daily Living    07/24/2021    8:36 AM  In your present state of health, do you have any difficulty performing the following activities:  Hearing? 0  Vision? 0  Difficulty concentrating or making decisions? 0  Walking or climbing stairs? 1  Dressing or bathing? 0  Doing errands, shopping? 0  Preparing Food and eating ? N  Using the Toilet? N  In the past six months, have you accidently leaked urine? Y  Comment wears pads for protection  Do you have problems with loss of bowel control? N  Managing your Medications? N  Managing your Finances? N  Housekeeping or managing your Housekeeping? N    Patient Care Team: Glean Hess, MD as PCP - General (Internal Medicine) [place], Jimmy J, MD as Consulting Physician (Physical Medicine and Rehabilitation) Melvyn Novas, MD as Referring Physician (Orthopedic Surgery) Willaim Rayas, NP as Nurse Practitioner (Family Medicine)  Indicate any recent Medical Services you may have received from other than [facility] providers in the past year (date may be approximate).     Assessment:   This is a routine wellness examination for Loraine.  Hearing/Vision screen Hearing Screening - Comments:: Pt denies hearing difficulty Vision Screening - Comments:: Annual vision screenings with Dr. Wyatt Portela at [place] issues and exercise activities discussed: Current Exercise Habits: The patient does not participate in regular exercise at present, Exercise limited by: orthopedic condition(s)   Goals Addressed   None    Depression Screen    07/24/2021    8:29 AM 06/23/2021    3:01 PM 01/04/2020   10:23 AM 11/25/2019   10:04 AM  07/30/2019   10:15 AM 04/29/2019   12:34 PM 02/16/2019    1:38  PM  PHQ 2/9 Scores  PHQ - 2 Score '2 1 1 1 ' 0 0 0  PHQ- 9 Score '6 7 2 2 ' 0  1    Fall Risk    07/24/2021    8:36 AM 06/23/2021    3:02 PM 01/04/2020   10:26 AM 11/25/2019   10:04 AM 07/30/2019   10:15 AM  Fall Risk   Falls in the past year? '1 1 1 1 ' 0  Number falls in past yr: '1 1 1 1 ' 0  Injury with Fall? 0 0 1 0 0  Risk for fall due to : Impaired mobility;Impaired balance/gait;History of fall(s) History of fall(s) History of fall(s);Impaired balance/gait;Impaired mobility;Orthopedic patient History of fall(s);Impaired balance/gait History of fall(s)  Follow up Falls prevention discussed Falls evaluation completed  Falls evaluation completed Falls evaluation completed    FALL RISK PREVENTION PERTAINING TO THE HOME:  Any stairs in or around the home? Yes  If so, are there any without handrails? No  Home free of loose throw rugs in walkways, pet beds, electrical cords, etc? Yes  Adequate lighting in your home to reduce risk of falls? Yes   ASSISTIVE DEVICES UTILIZED TO PREVENT FALLS:  Life alert? No  Use of a cane, walker or w/c? Yes  Grab bars in the bathroom? No  Shower chair or bench in shower? No  Elevated toilet seat or a handicapped toilet? Yes   TIMED UP AND GO:  Was the test performed? No . Telephonic visit.  Cognitive Function: Normal cognitive status assessed by direct observation by this Nurse Health Advisor. No abnormalities found.          01/04/2020   10:29 AM 12/29/2018   10:23 AM  6CIT Screen  What Year? 0 points 0 points  What month? 0 points 0 points  What time? 0 points 0 points  Count back from 20 0 points 0 points  Months in reverse 0 points 0 points  Repeat phrase 2 points 0 points  Total Score 2 points 0 points    Immunizations Immunization History  Administered Date(s) Administered   Influenza, Seasonal, Injecte, Preservative Fre 11/29/2011   Influenza,inj,Quad PF,6+ Mos  11/24/2014, 11/25/2015, 12/24/2016, 12/11/2017, 10/29/2018, 11/25/2019   Influenza,inj,quad, With Preservative 03/08/2017   Influenza-Unspecified 11/24/2014, 11/25/2015, 12/24/2016, 12/11/2017, 10/29/2018, 11/25/2019, 10/18/2020   PFIZER(Purple Top)SARS-COV-2 Vaccination 05/18/2019, 06/10/2019   PNEUMOCOCCAL CONJUGATE-20 09/16/2020   Pneumococcal Conjugate-13 11/24/2014   Pneumococcal Polysaccharide-23 05/20/2012   Tdap 11/25/2019    TDAP status: Up to date  Flu Vaccine status: Up to date  Pneumococcal vaccine status: Up to date  Covid-19 vaccine status: Completed vaccines  Qualifies for Shingles Vaccine? Yes   Zostavax completed No   Shingrix Completed?: No.    Education has been provided regarding the importance of this vaccine. Patient has been advised to call insurance company to determine out of pocket expense if they have not yet received this vaccine. Advised may also receive vaccine at local pharmacy or Health Dept. Verbalized acceptance and understanding.  Screening Tests Health Maintenance  Topic Date Due   COVID-19 Vaccine (3 - Pfizer risk series) 07/08/2019   COLONOSCOPY (Pts 45-58yr Insurance coverage will need to be confirmed)  03/05/2021   Zoster Vaccines- Shingrix (1 of 2) 09/23/2021 (Originally 08/06/1974)   INFLUENZA VACCINE  09/19/2021   OPHTHALMOLOGY EXAM  09/22/2021   MAMMOGRAM  10/28/2021   HEMOGLOBIN A1C  12/24/2021   FOOT EXAM  06/24/2022   URINE MICROALBUMIN  06/24/2022   TETANUS/TDAP  11/24/2029  Pneumonia Vaccine 71+ Years old  Completed   DEXA SCAN  Completed   Hepatitis C Screening  Completed   HIV Screening  Completed   HPV VACCINES  Aged Out    Health Maintenance  Health Maintenance Due  Topic Date Due   COVID-19 Vaccine (3 - Pfizer risk series) 07/08/2019   COLONOSCOPY (Pts 45-5yr Insurance coverage will need to be confirmed)  03/05/2021    Colorectal cancer screening: Type of screening: Colonoscopy. Completed 03/05/16. Repeat every  5 years. [facility]  Mammogram status: Completed 10/28/20. Repeat every year. Ordered today  Bone Density status: Completed 06/18/13. Results reflect: Bone density results: OSTEOPENIA. Repeat every 2 years. Ordered today.  Lung Cancer Screening: (Low Dose CT Chest recommended if Age 66-80years, 30 pack-year currently smoking OR have quit w/in 15years.) does not qualify.   Additional Screening:  Hepatitis C Screening: does qualify; Completed 06/23/21  Vision Screening: Recommended annual ophthalmology exams for early detection of glaucoma and other disorders of the eye. Is the patient up to date with their annual eye exam?  Yes  Who is the provider or what is the name of the office in which the patient attends annual eye exams? Dr. SWyatt Portela   Dental Screening: Recommended annual dental exams for proper oral hygiene  Community Resource Referral / Chronic Care Management: CRR required this visit?  No   CCM required this visit?  No      Plan:     I have personally reviewed and noted the following in the patient's chart:   Medical and social history Use of alcohol, tobacco or illicit drugs  Current medications and supplements including opioid prescriptions.  Functional ability and status Nutritional status Physical activity Advanced directives List of other physicians Hospitalizations, surgeries, and ER visits in previous 12 months Vitals Screenings to include cognitive, depression, and falls Referrals and appointments  In addition, I have reviewed and discussed with patient certain preventive protocols, quality metrics, and best practice recommendations. A written personalized care plan for preventive services as well as general preventive health recommendations were provided to patient.     KClemetine Marker LPN   [id number]  Nurse Notes: pt c/o of increasing left knee pain; plans to contact [facility]

## 2021-07-24 NOTE — Patient Instructions (Signed)
Hannah [name] , Thank you for taking time to come for your Medicare Wellness Visit. I appreciate your ongoing commitment to your health goals. Please review the following plan we discussed and let me know if I can assist you in the future.   Screening recommendations/referrals: Colonoscopy: done 03/05/16. A referral has been sent to [facility] today for repeat screening colonoscopy. They will contact you for an appointment Mammogram: done 10/28/20. Please call [phone] to schedule your mammogram and bone density screening.  Bone Density: done 06/18/13 Recommended yearly ophthalmology/optometry visit for glaucoma screening and checkup Recommended yearly dental visit for hygiene and checkup  Vaccinations: Influenza vaccine: done 10/18/20 Pneumococcal vaccine: done 09/16/20 Tdap vaccine: done 11/25/19 Shingles vaccine: Shingrix discussed. Please contact your pharmacy for coverage information.  Covid-19:done 05/18/19,06/10/19  Advanced directives: Please bring a copy of your health care power of attorney and living will to the office at your convenience.   Conditions/risks identified: Recommend fall prevention in the home  Next appointment: Follow up in one year for your annual wellness visit    Preventive Care 65 Years and Older, Female Preventive care refers to lifestyle choices and visits with your health care provider that can promote health and wellness. What does preventive care include? A yearly physical exam. This is also called an annual well check. Dental exams once or twice a year. Routine eye exams. Ask your health care provider how often you should have your eyes checked. Personal lifestyle choices, including: Daily care of your teeth and gums. Regular physical activity. Eating a healthy diet. Avoiding tobacco and drug use. Limiting alcohol use. Practicing safe sex. Taking low-dose aspirin every day. Taking vitamin and mineral supplements as recommended by your  health care provider. What happens during an annual well check? The services and screenings done by your health care provider during your annual well check will depend on your age, overall health, lifestyle risk factors, and family history of disease. Counseling  Your health care provider may ask you questions about your: Alcohol use. Tobacco use. Drug use. Emotional well-being. Home and relationship well-being. Sexual activity. Eating habits. History of falls. Memory and ability to understand (cognition). Work and work Statistician. Reproductive health. Screening  You may have the following tests or measurements: Height, weight, and BMI. Blood pressure. Lipid and cholesterol levels. These may be checked every 5 years, or more frequently if you are over 76 years old. Skin check. Lung cancer screening. You may have this screening every year starting at age 41 if you have a 30-pack-year history of smoking and currently smoke or have quit within the past 15 years. Fecal occult blood test (FOBT) of the stool. You may have this test every year starting at age 74. Flexible sigmoidoscopy or colonoscopy. You may have a sigmoidoscopy every 5 years or a colonoscopy every 10 years starting at age 26. Hepatitis C blood test. Hepatitis B blood test. Sexually transmitted disease (STD) testing. Diabetes screening. This is done by checking your blood sugar (glucose) after you have not eaten for a while (fasting). You may have this done every 1-3 years. Bone density scan. This is done to screen for osteoporosis. You may have this done starting at age 1. Mammogram. This may be done every 1-2 years. Talk to your health care provider about how often you should have regular mammograms. Talk with your health care provider about your test results, treatment options, and if necessary, the need for more tests. Vaccines  Your health care provider may recommend certain  vaccines, such as: Influenza vaccine.  This is recommended every year. Tetanus, diphtheria, and acellular pertussis (Tdap, Td) vaccine. You may need a Td booster every 10 years. Zoster vaccine. You may need this after age 40. Pneumococcal 13-valent conjugate (PCV13) vaccine. One dose is recommended after age 18. Pneumococcal polysaccharide (PPSV23) vaccine. One dose is recommended after age 55. Talk to your health care provider about which screenings and vaccines you need and how often you need them. This information is not intended to replace advice given to you by your health care provider. Make sure you discuss any questions you have with your health care provider. Document Released: 03/04/2015 Document Revised: 10/26/2015 Document Reviewed: 12/07/2014 Elsevier Interactive Patient Education  2017 [place] Prevention in the Home Falls can cause injuries. They can happen to people of all ages. There are many things you can do to make your home safe and to help prevent falls. What can I do on the outside of my home? Regularly fix the edges of walkways and driveways and fix any cracks. Remove anything that might make you trip as you walk through a door, such as a raised step or threshold. Trim any bushes or trees on the path to your home. Use bright outdoor lighting. Clear any walking paths of anything that might make someone trip, such as rocks or tools. Regularly check to see if handrails are loose or broken. Make sure that both sides of any steps have handrails. Any raised decks and porches should have guardrails on the edges. Have any leaves, snow, or ice cleared regularly. Use sand or salt on walking paths during winter. Clean up any spills in your garage right away. This includes oil or grease spills. What can I do in the bathroom? Use night lights. Install grab bars by the toilet and in the tub and shower. Do not use towel bars as grab bars. Use non-skid mats or decals in the tub or shower. If you need to sit  down in the shower, use a plastic, non-slip stool. Keep the floor dry. Clean up any water that spills on the floor as soon as it happens. Remove soap buildup in the tub or shower regularly. Attach bath mats securely with double-sided non-slip rug tape. Do not have throw rugs and other things on the floor that can make you trip. What can I do in the bedroom? Use night lights. Make sure that you have a light by your bed that is easy to reach. Do not use any sheets or blankets that are too big for your bed. They should not hang down onto the floor. Have a firm chair that has side arms. You can use this for support while you get dressed. Do not have throw rugs and other things on the floor that can make you trip. What can I do in the kitchen? Clean up any spills right away. Avoid walking on wet floors. Keep items that you use a lot in easy-to-reach places. If you need to reach something above you, use a strong step stool that has a grab bar. Keep electrical cords out of the way. Do not use floor polish or wax that makes floors slippery. If you must use wax, use non-skid floor wax. Do not have throw rugs and other things on the floor that can make you trip. What can I do with my stairs? Do not leave any items on the stairs. Make sure that there are handrails on both sides of the stairs  and use them. Fix handrails that are broken or loose. Make sure that handrails are as long as the stairways. Check any carpeting to make sure that it is firmly attached to the stairs. Fix any carpet that is loose or worn. Avoid having throw rugs at the top or bottom of the stairs. If you do have throw rugs, attach them to the floor with carpet tape. Make sure that you have a light switch at the top of the stairs and the bottom of the stairs. If you do not have them, ask someone to add them for you. What else can I do to help prevent falls? Wear shoes that: Do not have high heels. Have rubber bottoms. Are  comfortable and fit you well. Are closed at the toe. Do not wear sandals. If you use a stepladder: Make sure that it is fully opened. Do not climb a closed stepladder. Make sure that both sides of the stepladder are locked into place. Ask someone to hold it for you, if possible. Clearly mark and make sure that you can see: Any grab bars or handrails. First and last steps. Where the edge of each step is. Use tools that help you move around (mobility aids) if they are needed. These include: Canes. Walkers. Scooters. Crutches. Turn on the lights when you go into a dark area. Replace any light bulbs as soon as they burn out. Set up your furniture so you have a clear path. Avoid moving your furniture around. If any of your floors are uneven, fix them. If there are any pets around you, be aware of where they are. Review your medicines with your doctor. Some medicines can make you feel dizzy. This can increase your chance of falling. Ask your doctor what other things that you can do to help prevent falls. This information is not intended to replace advice given to you by your health care provider. Make sure you discuss any questions you have with your health care provider. Document Released: 12/02/2008 Document Revised: 07/14/2015 Document Reviewed: 03/12/2014 Elsevier Interactive Patient Education  2017 [employer].

## 2021-08-19 ENCOUNTER — Other Ambulatory Visit: Payer: Self-pay | Admitting: Internal Medicine

## 2021-08-19 DIAGNOSIS — E1169 Type 2 diabetes mellitus with other specified complication: Secondary | ICD-10-CM

## 2021-08-21 ENCOUNTER — Other Ambulatory Visit: Payer: Self-pay

## 2021-08-21 NOTE — Telephone Encounter (Signed)
Requested medications are due for refill today.  yes  Requested medications are on the active medications list.  yes  Last refill. 06/19/2020 #180 1 refill  Future visit scheduled.   yes  Notes to clinic.  Labs are expired.    Requested Prescriptions  Pending Prescriptions Disp Refills   atorvastatin (LIPITOR) 20 MG tablet [Pharmacy Med Name: ATORVASTATIN '20MG'$  TABLETS] 180 tablet 1    Sig: TAKE 1 TABLET BY MOUTH DAILY     Cardiovascular:  Antilipid - Statins Failed - 08/19/2021 10:17 AM      Failed - Lipid Panel in normal range within the last 12 months    Cholesterol, Total  Date Value Ref Range Status  11/25/2019 134 100 - 199 mg/dL Final   LDL Chol Calc (NIH)  Date Value Ref Range Status  11/25/2019 63 0 - 99 mg/dL Final   HDL  Date Value Ref Range Status  11/25/2019 55 >39 mg/dL Final   Triglycerides  Date Value Ref Range Status  11/25/2019 86 0 - 149 mg/dL Final         Passed - Patient is not pregnant      Passed - Valid encounter within last 12 months    Recent Outpatient Visits           1 month ago Type II diabetes mellitus with complication [facility])   [facility] Glean Hess, MD   1 year ago Type II diabetes mellitus with complication [facility])   [facility] Glean Hess, MD   2 years ago Type II diabetes mellitus with complication [facility])   [facility] Glean Hess, MD   2 years ago Annual physical exam   [facility] Glean Hess, MD   2 years ago Type II diabetes mellitus with complication [facility])   [facility] Glean Hess, MD       Future Appointments             In 2 months Army Melia Jesse Sans, MD [facility]

## 2021-08-25 DIAGNOSIS — M25561 Pain in right knee: Secondary | ICD-10-CM | POA: Diagnosis not present

## 2021-08-25 DIAGNOSIS — M1712 Unilateral primary osteoarthritis, left knee: Secondary | ICD-10-CM | POA: Diagnosis not present

## 2021-08-25 DIAGNOSIS — M25562 Pain in left knee: Secondary | ICD-10-CM | POA: Diagnosis not present

## 2021-09-05 DIAGNOSIS — Z79899 Other long term (current) drug therapy: Secondary | ICD-10-CM | POA: Diagnosis not present

## 2021-09-05 DIAGNOSIS — M47812 Spondylosis without myelopathy or radiculopathy, cervical region: Secondary | ICD-10-CM | POA: Diagnosis not present

## 2021-09-05 DIAGNOSIS — G2581 Restless legs syndrome: Secondary | ICD-10-CM | POA: Diagnosis not present

## 2021-09-05 DIAGNOSIS — M5416 Radiculopathy, lumbar region: Secondary | ICD-10-CM | POA: Diagnosis not present

## 2021-09-05 DIAGNOSIS — Z96651 Presence of right artificial knee joint: Secondary | ICD-10-CM | POA: Diagnosis not present

## 2021-09-05 DIAGNOSIS — M47816 Spondylosis without myelopathy or radiculopathy, lumbar region: Secondary | ICD-10-CM | POA: Diagnosis not present

## 2021-09-05 DIAGNOSIS — G894 Chronic pain syndrome: Secondary | ICD-10-CM | POA: Diagnosis not present

## 2021-09-05 DIAGNOSIS — R202 Paresthesia of skin: Secondary | ICD-10-CM | POA: Diagnosis not present

## 2021-09-05 DIAGNOSIS — M25562 Pain in left knee: Secondary | ICD-10-CM | POA: Diagnosis not present

## 2021-09-11 ENCOUNTER — Encounter: Payer: Self-pay | Admitting: Gastroenterology

## 2021-09-17 NOTE — Anesthesia Preprocedure Evaluation (Signed)
Anesthesia Evaluation  Patient identified by MRN, date of birth, ID band Patient awake    Reviewed: Allergy & Precautions, NPO status , Patient's Chart, lab work & pertinent test results  History of Anesthesia Complications Negative for: history of anesthetic complications  Airway Mallampati: II  TM Distance: >3 FB Neck ROM: full    Dental  (+) Missing, Poor Dentition, Edentulous Upper, Chipped,  Serveral missing; :   Pulmonary COPD (mild),  COPD inhaler, Current Smoker and Patient abstained from smoking., former smoker,    Pulmonary exam normal        Cardiovascular Exercise Tolerance: Good negative cardio ROS   Rhythm:Regular Rate:Normal + Systolic murmurs Lipids    Neuro/Psych 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;  DDD; chronic pain;   negative psych ROS   GI/Hepatic Neg liver ROS, GERD  Medicated,dysphagia    Endo/Other  diabetes, Type 2  Renal/GU negative Renal ROS  negative genitourinary   Musculoskeletal  (+) Arthritis ,   Abdominal Normal abdominal exam  (+)   Peds  Hematology negative hematology ROS (+)   Anesthesia Other Findings Spastic paraplegia > avoid Succ  Reproductive/Obstetrics                            Anesthesia Physical  Anesthesia Plan  ASA: 3  Anesthesia Plan: General   Post-op Pain Management:    Induction: Intravenous  PONV Risk Score and Plan: Treatment may vary due to age or medical condition and Propofol infusion  Airway Management Planned: Natural Airway  Additional Equipment:   Intra-op Plan:   Post-operative Plan:   Informed Consent: I have reviewed the patients History and Physical, chart, labs and discussed the procedure including the risks, benefits and alternatives for the proposed anesthesia with the patient or authorized representative who has indicated his/her understanding and acceptance.     Dental advisory given  Plan  Discussed with: CRNA  Anesthesia Plan Comments:        Anesthesia Quick Evaluation

## 2021-09-18 ENCOUNTER — Ambulatory Visit (AMBULATORY_SURGERY_CENTER): Payer: Medicare HMO | Admitting: Anesthesiology

## 2021-09-18 ENCOUNTER — Other Ambulatory Visit: Payer: Self-pay

## 2021-09-18 ENCOUNTER — Encounter
Admission: RE | Disposition: A | Discharge status: Home / Self Care | Payer: Self-pay | Source: Home / Self Care | Attending: Gastroenterology

## 2021-09-18 ENCOUNTER — Encounter: Payer: Self-pay | Admitting: Gastroenterology

## 2021-09-18 ENCOUNTER — Ambulatory Visit
Admission: RE | Admit: 2021-09-18 | Discharge: 2021-09-18 | Disposition: A | Discharge status: Home / Self Care | Payer: Medicare HMO | Attending: Gastroenterology | Admitting: Gastroenterology

## 2021-09-18 ENCOUNTER — Ambulatory Visit: Payer: Medicare HMO | Admitting: Anesthesiology

## 2021-09-18 DIAGNOSIS — Z87891 Personal history of nicotine dependence: Secondary | ICD-10-CM | POA: Insufficient documentation

## 2021-09-18 DIAGNOSIS — M1990 Unspecified osteoarthritis, unspecified site: Secondary | ICD-10-CM | POA: Insufficient documentation

## 2021-09-18 DIAGNOSIS — K640 First degree hemorrhoids: Secondary | ICD-10-CM

## 2021-09-18 DIAGNOSIS — R1310 Dysphagia, unspecified: Secondary | ICD-10-CM | POA: Insufficient documentation

## 2021-09-18 DIAGNOSIS — J449 Chronic obstructive pulmonary disease, unspecified: Secondary | ICD-10-CM

## 2021-09-18 DIAGNOSIS — K219 Gastro-esophageal reflux disease without esophagitis: Secondary | ICD-10-CM | POA: Insufficient documentation

## 2021-09-18 DIAGNOSIS — E119 Type 2 diabetes mellitus without complications: Secondary | ICD-10-CM | POA: Diagnosis not present

## 2021-09-18 DIAGNOSIS — J439 Emphysema, unspecified: Secondary | ICD-10-CM | POA: Insufficient documentation

## 2021-09-18 DIAGNOSIS — Z860100 Personal history of colon polyps, unspecified: Secondary | ICD-10-CM

## 2021-09-18 DIAGNOSIS — G8220 Paraplegia, unspecified: Secondary | ICD-10-CM | POA: Insufficient documentation

## 2021-09-18 DIAGNOSIS — K5730 Diverticulosis of large intestine without perforation or abscess without bleeding: Secondary | ICD-10-CM | POA: Insufficient documentation

## 2021-09-18 DIAGNOSIS — Z1211 Encounter for screening for malignant neoplasm of colon: Secondary | ICD-10-CM | POA: Diagnosis not present

## 2021-09-18 HISTORY — PX: COLONOSCOPY WITH PROPOFOL: SHX5780

## 2021-09-18 LAB — GLUCOSE, CAPILLARY
Glucose-Capillary: 129 mg/dL — ABNORMAL HIGH (ref 70–99)
Glucose-Capillary: 127 mg/dL — ABNORMAL HIGH (ref 70–99)

## 2021-09-18 SURGERY — COLONOSCOPY WITH PROPOFOL
Anesthesia: General | Site: Rectum

## 2021-09-18 MED ORDER — LACTATED RINGERS IV SOLN
INTRAVENOUS | Status: DC
Start: 2021-09-18 — End: 2021-09-18

## 2021-09-18 MED ORDER — LIDOCAINE HCL (CARDIAC) PF 100 MG/5ML IV SOSY
PREFILLED_SYRINGE | INTRAVENOUS | Status: DC | PRN
  Administered 2021-09-18: 50 mg via INTRAVENOUS

## 2021-09-18 MED ORDER — PROPOFOL 10 MG/ML IV BOLUS
INTRAVENOUS | Status: DC | PRN
  Administered 2021-09-18: 120 ug/kg/min via INTRAVENOUS

## 2021-09-18 MED ORDER — SODIUM CHLORIDE 0.9 % IV SOLN: INTRAVENOUS | Status: DC

## 2021-09-18 MED ORDER — STERILE WATER FOR IRRIGATION IR SOLN
Status: DC | PRN
  Administered 2021-09-18: 100 mL

## 2021-09-18 SURGICAL SUPPLY — 6 items
GOWN CVR UNV OPN BCK APRN NK (MISCELLANEOUS) ×2 IMPLANT
GOWN ISOL THUMB LOOP REG UNIV (MISCELLANEOUS) ×4
KIT PRC NS LF DISP ENDO (KITS) ×1 IMPLANT
KIT PROCEDURE OLYMPUS (KITS) ×2
MANIFOLD NEPTUNE II (INSTRUMENTS) ×2 IMPLANT
WATER STERILE IRR 250ML POUR (IV SOLUTION) ×2 IMPLANT

## 2021-09-18 NOTE — H&P (Signed)
[name], MD [facility] [address] [place], [place] [address] Phone:[phone] Fax : [phone]  Primary Care Physician:  Glean Hess, MD Primary Gastroenterologist:  Dr. Allen Norris  Pre-Procedure History & Physical: HPI:  Hannah [name] is a 66 y.o. female is here for an colonoscopy.   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 Authorizing Provider  ADVAIR DISKUS 250-50 MCG/ACT AEPB INHALE 1 PUFF INTO THE LUNGS TWICE DAILY 11/17/20  Yes Glean Hess, MD  albuterol (VENTOLIN HFA) 108 (90 Base) MCG/ACT inhaler INHALE 2 PUFFS BY MOUTH EVERY 6 HOURS AS NEEDED FOR WHEEZING OR SHORTNESS OF BREATH 11/17/20  Yes Glean Hess, MD  atorvastatin (LIPITOR) 20 MG tablet TAKE 1 TABLET BY MOUTH DAILY 08/21/21  Yes Glean Hess, MD  fluticasone [facility]) 50 MCG/ACT nasal spray SHAKE LIQUID AND USE 2 SPRAYS IN EACH NOSTRIL DAILY 05/28/20  Yes Glean Hess, MD  JANUVIA 100 MG tablet Take 1 tablet (100 mg total) by mouth daily. 06/23/21  Yes Glean Hess, MD  omeprazole (PRILOSEC) 20 MG capsule Take 1 capsule (20 mg total) by mouth 2 (two) times daily before a meal. 06/23/21  Yes Glean Hess, MD  oxyCODONE-acetaminophen (PERCOCET)  10-325 MG tablet Take 1 tablet by mouth every 4 (four) hours as needed. 12/05/17  Yes [provider]  pramipexole (MIRAPEX) 0.125 MG tablet Mirapex 0.125 mg tablet  1 to 2 in pm for RLS   Yes [provider]  tiZANidine (ZANAFLEX) 4 MG tablet Take 4 mg by mouth every 6 (six) hours as needed.  10/01/12  Yes [provider]  Blood Glucose Monitoring Suppl (ONETOUCH VERIO) w/Device KIT 1 each by Does not apply route 2 (two) times daily. Test BS twice daily. 01/10/18   Glean Hess, MD  diclofenac sodium (VOLTAREN) 1 % GEL APP 4 GRAMS EXT AA QID UTD 12/05/17   [provider]  gabapentin (NEURONTIN) 600 MG tablet Take 1 tablet (600 mg total) by mouth 3 (three) times daily. Patient taking differently: Take 600 mg by mouth in the morning, at noon, in the evening, and at bedtime. Patient takes 2 qam and 2 qpm 08/25/15   Glean Hess, MD  Lancets [facility] DELICA PLUS [id number]) MISC Use up to 4 times daily to test blood sugar as needed 04/01/20   Glean Hess, MD  metFORMIN (GLUCOPHAGE-XR) 500 MG 24 hr tablet TAKE 2 TABLETS BY MOUTH EVERY MORNING THEN TAKE 1 TABLET BY MOUTH IN THE EVENING Patient taking differently: Take 1,000 mg by mouth in the morning and at bedtime. 05/18/20   Glean Hess, MD  [facility] ULTRA test strip USE TO TEST BLOOD SUGAR  TWICE DAILY 04/30/20   Glean Hess, MD    Allergies as of 07/24/2021 - Review Complete 07/24/2021  Allergen Reaction Noted   Levofloxacin Nausea And Vomiting 05/14/2016    Family History  Problem Relation Age of Onset   Cervical cancer Mother    Colon cancer Mother    Diabetes Father    Heart disease Father    Breast cancer Sister 62   Breast cancer Cousin 73       pat cousin   Diabetes Sister    Diabetes Sister    Congestive Heart Failure Paternal Grandmother    Diabetes Paternal Grandmother     Social History   Socioeconomic History   Marital status: Widowed    Spouse name: Not on file    Number of children: 2   Years of education: Not on file   Highest education level: Some college, no degree  Occupational History   Occupation: retired  Tobacco Use   Smoking status: Former    Packs/day: 0.50    Years: 25.00    Total pack years: 12.50    Types: Cigarettes    Quit date: 03/05/2016    Years since quitting: 5.5   Smokeless tobacco: Never  Vaping Use   Vaping Use: Never used  Substance and Sexual Activity   Alcohol use: No   Drug use: No   Sexual activity: Never  Other Topics Concern   Not on file  Social History Narrative   Pt's son lives with her; he is recovering from TBI due to motorcycle accident 09/10/19   Social Determinants of Health   Financial Resource Strain: Low Risk  (07/24/2021)   Overall Financial Resource Strain (CARDIA)    Difficulty of Paying Living Expenses: Not hard at all  Food Insecurity: No Food Insecurity (07/24/2021)   Hunger Vital Sign    Worried About Running Out of Food in the Last Year: Never true    [place] in the Last Year: Never true  Transportation Needs: No Transportation Needs (07/24/2021)   PRAPARE - Hydrologist (Medical): No    Lack of Transportation (Non-Medical): No  Physical Activity: Inactive (07/24/2021)   Exercise Vital Sign    Days of Exercise per Week: 0 days    Minutes of Exercise per Session: 0 min  Stress: No Stress Concern Present (07/24/2021)   [place]    Feeling of Stress : Not at all  Social Connections: Socially Isolated (07/24/2021)   Social Connection and Isolation Panel [NHANES]    Frequency of Communication with Friends and Family: More than three times a week    Frequency of Social Gatherings with Friends and Family: More than three times a week    Attends Religious Services: Never    Marine scientist or Organizations: No    Attends Archivist Meetings: Never    Marital Status: Widowed   Intimate Partner Violence: Not At Risk (07/24/2021)   Humiliation, Afraid, Rape, and Kick questionnaire    Fear of Current or Ex-Partner: No    Emotionally Abused: No    Physically Abused: No    Sexually Abused: No    Review of Systems: See HPI, otherwise negative ROS  Physical Exam: BP 128/76   Pulse 61   Temp 98.2 F (36.8 C) (Temporal)   Ht '5\' 6"'  (1.676 m)   Wt 65 kg   SpO2 96%   BMI 23.13  kg/m  General:   Alert,  pleasant and cooperative in NAD Head:  Normocephalic and atraumatic. Neck:  Supple; no masses or thyromegaly. Lungs:  Clear throughout to auscultation.    Heart:  Regular rate and rhythm. Abdomen:  Soft, nontender and nondistended. Normal bowel sounds, without guarding, and without rebound.   Neurologic:  Alert and  oriented x4;  grossly normal neurologically.  Impression/Plan: SHARRAN CARATACHEA is here for an colonoscopy to be performed for a history of adenomatous polyps on 2018   Risks, benefits, limitations, and alternatives regarding  colonoscopy have been reviewed with the patient.  Questions have been answered.  All parties agreeable.   [name], MD  09/18/2021, 8:10 AM

## 2021-09-18 NOTE — Anesthesia Postprocedure Evaluation (Signed)
Anesthesia Post Note  Patient: Hannah [name]  Procedure(s) Performed: COLONOSCOPY WITH PROPOFOL (Rectum)     Patient location during evaluation: PACU Anesthesia Type: General Level of consciousness: awake and alert Pain management: pain level controlled Vital Signs Assessment: post-procedure vital signs reviewed and stable Respiratory status: spontaneous breathing, nonlabored ventilation and respiratory function stable Cardiovascular status: blood pressure returned to baseline and stable Postop Assessment: no apparent nausea or vomiting Anesthetic complications: no   No notable events documented.  [place] Ouch

## 2021-09-18 NOTE — Transfer of Care (Signed)
Immediate Anesthesia Transfer of Care Note  Patient: Hannah [name]  Procedure(s) Performed: COLONOSCOPY WITH PROPOFOL (Rectum)  Patient Location: PACU  Anesthesia Type: General  Level of Consciousness: awake, alert  and patient cooperative  Airway and Oxygen Therapy: Patient Spontanous Breathing and Patient connected to supplemental oxygen  Post-op Assessment: Post-op Vital signs reviewed, Patient's Cardiovascular Status Stable, Respiratory Function Stable, Patent Airway and No signs of Nausea or vomiting  Post-op Vital Signs: Reviewed and stable  Complications: No notable events documented.

## 2021-09-18 NOTE — Op Note (Signed)
[facility] Gastroenterology Patient Name: Hannah [name] Procedure Date: 09/18/2021 8:11 AM MRN: [id number] Account #: [id number] Date of Birth: April 15, 1955 Admit Type: Outpatient Age: 66 Room: [facility] OR ROOM 01 Gender: Female Note Status: Finalized Instrument Name: [id number] Procedure:             Colonoscopy Indications:           High risk colon cancer surveillance: Personal history                         of colonic polyps Providers:             [name] MD, MD Referring MD:          Halina Maidens, MD (Referring MD) Medicines:             Propofol per Anesthesia Complications:         No immediate complications. Procedure:             Pre-Anesthesia Assessment:                        - Prior to the procedure, a History and Physical was                         performed, and patient medications and allergies were                         reviewed. The patient's tolerance of previous                         anesthesia was also reviewed. The risks and benefits                         of the procedure and the sedation options and risks                         were discussed with the patient. All questions were                         answered, and informed consent was obtained. Prior                         Anticoagulants: The patient has taken no previous                         anticoagulant or antiplatelet agents. ASA Grade                         Assessment: II - A patient with mild systemic disease.                         After reviewing the risks and benefits, the patient                         was deemed in satisfactory condition to undergo the                         procedure.  After obtaining informed consent, the colonoscope was                         passed under direct vision. Throughout the procedure,                         the patient's blood pressure, pulse, and oxygen                         saturations were monitored  continuously. The                         Colonoscope was introduced through the anus and                         advanced to the the cecum, identified by appendiceal                         orifice and ileocecal valve. The colonoscopy was                         performed without difficulty. The patient tolerated                         the procedure well. The quality of the bowel                         preparation was excellent. Findings:      The perianal and digital rectal examinations were normal.      Many small-mouthed diverticula were found in the entire colon.      Non-bleeding internal hemorrhoids were found during retroflexion. The       hemorrhoids were Grade I (internal hemorrhoids that do not prolapse). Impression:            - Diverticulosis in the entire examined colon.                        - Non-bleeding internal hemorrhoids.                        - No specimens collected. Recommendation:        - Discharge patient to home.                        - Resume previous diet.                        - Continue present medications.                        - Repeat colonoscopy in 7 years for surveillance. Procedure Code(s):     --- Professional ---                        [id number], Colonoscopy, flexible; diagnostic, including                         collection of specimen(s) by brushing or washing, when  performed (separate procedure) Diagnosis Code(s):     --- Professional ---                        Z86.010, Personal history of colonic polyps CPT copyright 2019 American Medical Association. All rights reserved. The codes documented in this report are preliminary and upon coder review may  be revised to meet current compliance requirements. [name] MD, MD 09/18/2021 8:36:50 AM This report has been signed electronically. Number of Addenda: 0 Note Initiated On: 09/18/2021 8:11 AM Scope Withdrawal Time: 0 hours 9 minutes 4 seconds  Total Procedure  Duration: 0 hours 14 minutes 23 seconds  Estimated Blood Loss:  Estimated blood loss: none.      [facility]

## 2021-09-20 ENCOUNTER — Other Ambulatory Visit: Payer: Self-pay | Admitting: Internal Medicine

## 2021-09-20 DIAGNOSIS — E118 Type 2 diabetes mellitus with unspecified complications: Secondary | ICD-10-CM

## 2021-09-21 NOTE — Telephone Encounter (Signed)
Requested Prescriptions  Pending Prescriptions Disp Refills  . ONETOUCH ULTRA test strip [employer] Med Name: [place] TESTST(NEW)100] 200 strip 6    Sig: USE TO TEST BLOOD SUGAR TWICE DAILY     Endocrinology: Diabetes - Testing Supplies Passed - 09/20/2021 11:45 AM      Passed - Valid encounter within last 12 months    Recent Outpatient Visits          3 months ago Type II diabetes mellitus with complication [facility])   [facility] Glean Hess, MD   1 year ago Type II diabetes mellitus with complication [facility])   [facility] Glean Hess, MD   2 years ago Type II diabetes mellitus with complication [facility])   [facility] Glean Hess, MD   2 years ago Annual physical exam   [facility] Glean Hess, MD   2 years ago Type II diabetes mellitus with complication [facility])   [facility] Glean Hess, MD      Future Appointments            In 1 month Army Melia, Jesse Sans, MD [facility], [facility]

## 2021-09-25 DIAGNOSIS — M47817 Spondylosis without myelopathy or radiculopathy, lumbosacral region: Secondary | ICD-10-CM | POA: Diagnosis not present

## 2021-10-17 ENCOUNTER — Other Ambulatory Visit: Payer: Self-pay | Admitting: Internal Medicine

## 2021-10-17 DIAGNOSIS — E118 Type 2 diabetes mellitus with unspecified complications: Secondary | ICD-10-CM

## 2021-10-18 NOTE — Telephone Encounter (Signed)
Requested Prescriptions  Pending Prescriptions Disp Refills  . metFORMIN (GLUCOPHAGE-XR) 500 MG 24 hr tablet [Pharmacy Med Name: METFORMIN ER 500MG 24HR TABS] 270 tablet 0    Sig: TAKE 2 TABLETS BY MOUTH EVERY MORNING THEN TAKE 1 TABLET BY MOUTH IN THE EVENING     Endocrinology:  Diabetes - Biguanides Failed - 10/17/2021  4:42 PM      Failed - B12 Level in normal range and within 720 days    No results found for: "VITAMINB12"       Passed - Cr in normal range and within 360 days    Creatinine  Date Value Ref Range Status  03/31/2013 0.62 0.60 - 1.30 mg/dL Final   Creatinine, Ser  Date Value Ref Range Status  06/23/2021 0.59 0.57 - 1.00 mg/dL Final         Passed - HBA1C is between 0 and 7.9 and within 180 days    Hemoglobin A1C  Date Value Ref Range Status  12/19/2020 6.6  Final   Hgb A1c MFr Bld  Date Value Ref Range Status  06/23/2021 7.5 (H) 4.8 - 5.6 % Final    Comment:             Prediabetes: 5.7 - 6.4          Diabetes: >6.4          Glycemic control for adults with diabetes: <7.0          Passed - eGFR in normal range and within 360 days    EGFR (African American)  Date Value Ref Range Status  03/31/2013 >60  Final   GFR calc Af Amer  Date Value Ref Range Status  11/25/2019 113 >59 mL/min/1.73 Final    Comment:    **[facility] currently reports eGFR in compliance with the current**   recommendations of the [employer]. [facility] will   update reporting as new guidelines are published from the NKF-ASN   Task force.    EGFR (Non-African Amer.)  Date Value Ref Range Status  03/31/2013 >60  Final    Comment:    eGFR values <64m/min/1.73 m2 may be an indication of chronic kidney disease (CKD). Calculated eGFR is useful in patients with stable renal function. The eGFR calculation will not be reliable in acutely ill patients when serum creatinine is changing rapidly. It is not useful in  patients on dialysis. The eGFR calculation may not be  applicable to patients at the low and high extremes of body sizes, pregnant women, and vegetarians.    GFR calc non Af Amer  Date Value Ref Range Status  11/25/2019 98 >59 mL/min/1.73 Final   eGFR  Date Value Ref Range Status  06/23/2021 100 >59 mL/min/1.73 Final         Passed - Valid encounter within last 6 months    Recent Outpatient Visits          3 months ago Type II diabetes mellitus with complication ([facility]   [place] [facility]se [facility]   1 year ago Type II diabetes mellitus with complication ([facility]   [place] [facility]   2 years ago Type II diabetes mellitus with complication ([facility]    [facility]   2 years ago Annual physical exam   [facility]   2 years ago Type II diabetes mellitus with complication [facility])   [place] [facility] at [facility], Jesse Sans, MD      Future Appointments            In 6 days Glean Hess, MD [facility] Primary Care and Sports Medicine at [facility], [place] within normal limits and completed in the last 12 months    WBC  Date Value Ref Range Status  06/23/2021 12.0 (H) 3.4 - 10.8 x10E3/uL Final  07/21/2018 11.7 (H) 4.0 - 10.5 K/uL Final   RBC  Date Value Ref Range Status  06/23/2021 4.56 3.77 - 5.28 x10E6/uL Final  07/21/2018 4.63 3.87 - 5.11 MIL/uL Final   Hemoglobin  Date Value Ref Range Status  06/23/2021 14.8 11.1 - 15.9 g/dL Final   Hematocrit  Date Value Ref Range Status  06/23/2021 42.4 34.0 - 46.6 % Final   MCHC  Date Value Ref Range Status  06/23/2021 34.9 31.5 - 35.7 g/dL Final  07/21/2018 33.6 30.0 - 36.0 g/dL Final   [facility]  Date Value Ref Range Status  06/23/2021 32.5 26.6  - 33.0 pg Final  07/21/2018 31.7 26.0 - 34.0 pg Final   MCV  Date Value Ref Range Status  06/23/2021 93 79 - 97 fL Final  03/31/2013 96 80 - 100 fL Final   No results found for: "PLTCOUNTKUC", "LABPLAT", "POCPLA" RDW  Date Value Ref Range Status  06/23/2021 11.8 11.7 - 15.4 % Final  03/31/2013 12.8 11.5 - 14.5 % Final         . JANUVIA 100 MG tablet [Pharmacy Med Name: JANUVIA 100MG TABLETS] 90 tablet 0    Sig: TAKE 1 TABLET(100 MG) BY MOUTH DAILY     Endocrinology:  Diabetes - DPP-4 Inhibitors Passed - 10/17/2021  4:42 PM      Passed - HBA1C is between 0 and 7.9 and within 180 days    Hemoglobin A1C  Date Value Ref Range Status  12/19/2020 6.6  Final   Hgb A1c MFr Bld  Date Value Ref Range Status  06/23/2021 7.5 (H) 4.8 - 5.6 % Final    Comment:             Prediabetes: 5.7 - 6.4          Diabetes: >6.4          Glycemic control for adults with diabetes: <7.0          Passed - Cr in normal range and within 360 days    Creatinine  Date Value Ref Range Status  03/31/2013 0.62 0.60 - 1.30 mg/dL Final   Creatinine, Ser  Date Value Ref Range Status  06/23/2021 0.59 0.57 - 1.00 mg/dL Final         Passed - Valid encounter within last 6 months    Recent Outpatient Visits          3 months ago Type II diabetes mellitus with complication ([place])   [place] [facility] at [facility], Jesse Sans, MD   1 year ago Type II diabetes mellitus with complication [facility])   [place] [facility] Care and Sports Medicine at [facility], Jesse Sans, MD   2 years ago Type II diabetes mellitus with complication [facility])   [place] [facility] at  [facility], [facility]   2 years ago Annual physical exam   [place] Primary Care and Sports Medicine at [facility], Jesse Sans, MD   2 years ago Type II diabetes mellitus with complication [facility])   [place] Primary Care and Sports Medicine at  [facility], Jesse Sans, MD      Future Appointments            In 6 days Army Melia Jesse Sans, MD [facility] Primary Care and Sports Medicine at [facility], [facility]

## 2021-10-20 ENCOUNTER — Other Ambulatory Visit: Payer: Self-pay | Admitting: Internal Medicine

## 2021-10-20 DIAGNOSIS — J439 Emphysema, unspecified: Secondary | ICD-10-CM

## 2021-10-24 ENCOUNTER — Encounter: Payer: Self-pay | Admitting: Internal Medicine

## 2021-10-24 ENCOUNTER — Ambulatory Visit: Payer: Medicare HMO | Admitting: Internal Medicine

## 2021-10-24 ENCOUNTER — Ambulatory Visit (INDEPENDENT_AMBULATORY_CARE_PROVIDER_SITE_OTHER): Payer: Medicare HMO | Admitting: Internal Medicine

## 2021-10-24 VITALS — BP 128/76 | HR 74 | Temp 98.2°F | Ht 66.0 in | Wt 136.2 lb

## 2021-10-24 DIAGNOSIS — J439 Emphysema, unspecified: Secondary | ICD-10-CM

## 2021-10-24 DIAGNOSIS — R1310 Dysphagia, unspecified: Secondary | ICD-10-CM

## 2021-10-24 DIAGNOSIS — R6889 Other general symptoms and signs: Secondary | ICD-10-CM | POA: Diagnosis not present

## 2021-10-24 DIAGNOSIS — E118 Type 2 diabetes mellitus with unspecified complications: Secondary | ICD-10-CM | POA: Diagnosis not present

## 2021-10-24 MED ORDER — FLUTICASONE-SALMETEROL 250-50 MCG/ACT IN AEPB: INHALATION_SPRAY | RESPIRATORY_TRACT | 12 refills | Status: DC

## 2021-10-24 NOTE — Telephone Encounter (Signed)
Requested medication (s) are due for refill today: yes  Requested medication (s) are on the active medication list: yes  Last refill:  11/17/2020 60 with 0 RF  Future visit scheduled: today  Notes to clinic:  only one month supply ordered 11/17/21, request is for generic has appt today, please assess.      Requested Prescriptions  Pending Prescriptions Disp Refills   WIXELA INHUB 250-50 MCG/ACT AEPB [Pharmacy Med Name: WIXELA INHUB DISKUS 250/50MCG 60S] 60 each 0    Sig: INHALE 1 PUFF INTO THE LUNGS TWICE DAILY     Pulmonology:  Combination Products Passed - 10/20/2021  9:24 AM      Passed - Valid encounter within last 12 months    Recent Outpatient Visits           4 months ago Type II diabetes mellitus with complication ([place])   [place] [facility] Medicine at [facility], Jesse Sans, MD   1 year ago Type II diabetes mellitus with complication [facility])   [place] [facility] Care and Sports Medicine at [facility], Jesse Sans, MD   2 years ago Type II diabetes mellitus with complication [facility])   [place] [facility] Care and Sports Medicine at [facility], Jesse Sans, MD   2 years ago Annual physical exam   [place] [facility] Care and Sports Medicine at [facility], Jesse Sans, MD   2 years ago Type II diabetes mellitus with complication [facility])   [place] [facility] Care and [facility] at [facility], Jesse Sans, MD       Future Appointments             Today Glean Hess, MD [facility] Primary Care and Sports Medicine at [facility], [facility]

## 2021-10-24 NOTE — Progress Notes (Signed)
  Date:  10/24/2021   Name:  Hannah [name]   DOB:  12/23/1955   MRN:  [id number]   Chief Complaint: Diabetes  Diabetes She presents for her follow-up diabetic visit. She has type 2 diabetes mellitus. Her disease course has been stable. Hypoglycemia symptoms include nervousness/anxiousness. Pertinent negatives for hypoglycemia include no headaches or tremors. Pertinent negatives for diabetes include no chest pain, no fatigue, no polydipsia and no polyuria. Symptoms are stable. Current diabetic treatment includes oral agent (dual therapy) (metformin, januvia). She is compliant with treatment all of the time. There is no change in her home blood glucose trend. Her breakfast blood glucose is taken between 6-7 am. Her breakfast blood glucose range is generally 110-130 mg/dl.   Dysphagia - she is having more trouble swallowing.  Food gets lodged in her mid esophagus about three times a week and she has to regurgitate it.  She is taking omeprazole daily.  She has some gerd but it is not severe.  She has lost some weight for unclear reason - possibly related to more stress helping her son with his three children since their mother died.   Lab Results  Component Value Date   NA 140 06/23/2021   K 3.9 06/23/2021   CO2 22 06/23/2021   GLUCOSE 131 (H) 06/23/2021   BUN 10 06/23/2021   CREATININE 0.59 06/23/2021   CALCIUM 9.2 06/23/2021   EGFR 100 06/23/2021   GFRNONAA 98 11/25/2019   Lab Results  Component Value Date   CHOL 134 11/25/2019   HDL 55 11/25/2019   LDLCALC 63 11/25/2019   TRIG 86 11/25/2019   CHOLHDL 2.4 11/25/2019   Lab Results  Component Value Date   TSH 1.530 04/29/2019   Lab Results  Component Value Date   HGBA1C 7.5 (H) 06/23/2021   Lab Results  Component Value Date   WBC 12.0 (H) 06/23/2021   HGB 14.8 06/23/2021   HCT 42.4 06/23/2021   MCV 93 06/23/2021   PLT 224 06/23/2021   Lab Results  Component Value Date   ALT 18 06/23/2021   AST 13 06/23/2021    ALKPHOS 60 06/23/2021   BILITOT <0.2 06/23/2021   No results found for: "25OHVITD2", "25OHVITD3", "VD25OH"   Review of Systems  Constitutional:  Negative for appetite change, fatigue, fever and unexpected weight change.  HENT:  Negative for tinnitus and trouble swallowing.   Eyes:  Negative for visual disturbance.  Respiratory:  Negative for cough, chest tightness and shortness of breath.   Cardiovascular:  Negative for chest pain, palpitations and leg swelling.  Gastrointestinal:  Negative for abdominal pain.       Dysphagia and frequent regurgitation with eating  Endocrine: Negative for polydipsia and polyuria.  Genitourinary:  Negative for dysuria and hematuria.  Musculoskeletal:  Positive for arthralgias and gait problem.  Neurological:  Negative for tremors, numbness and headaches.  Psychiatric/Behavioral:  Positive for dysphoric mood. Negative for sleep disturbance. The patient is nervous/anxious.     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   Type II diabetes mellitus with complication (HCC) 11/11/2014   COPD (chronic obstructive pulmonary   disease) with emphysema (HCC) 11/11/2014   Tobacco use disorder, mild, 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   Degenerative joint disease involving multiple joints 09/28/2013    Allergies  Allergen Reactions   Levofloxacin Nausea And Vomiting    Past Surgical History:  Procedure Laterality Date   COLONOSCOPY WITH PROPOFOL N/A 03/05/2016   Procedure: COLONOSCOPY WITH PROPOFOL;  Surgeon: Darren Wohl, MD;  Location: [facility];   Service: Endoscopy;  Laterality: N/A;   COLONOSCOPY WITH PROPOFOL N/A 09/18/2021   Procedure: COLONOSCOPY WITH PROPOFOL;  Surgeon: Wohl, Darren, MD;  Location: [facility];  Service: Endoscopy;  Laterality: N/A;   HIP FRACTURE SURGERY Right 03/2013   INNER EAR SURGERY Bilateral    REPLACEMENT TOTAL KNEE Right 02/2015   SHOULDER SURGERY Bilateral    rotator cuff    Social History   Tobacco Use   Smoking status: Former    Packs/day: 0.50    Years: 25.00    Total pack years: 12.50    Types: Cigarettes    Quit date: 03/05/2016    Years since quitting: 5.6   Smokeless tobacco: Never  Vaping Use   Vaping Use: Never used  Substance Use Topics   Alcohol use: No   Drug use: No     Medication list has been reviewed and updated.  Current Meds  Medication Sig   ADVAIR DISKUS 250-50 MCG/ACT AEPB INHALE 1 PUFF INTO THE LUNGS TWICE DAILY   albuterol (VENTOLIN HFA) 108 (90 Base) MCG/ACT inhaler INHALE 2 PUFFS BY MOUTH EVERY 6 HOURS AS NEEDED FOR WHEEZING OR SHORTNESS OF BREATH   atorvastatin (LIPITOR) 20 MG tablet TAKE 1 TABLET BY MOUTH DAILY   Blood Glucose Monitoring Suppl (ONETOUCH VERIO) w/Device KIT 1 each by Does not apply route 2 (two) times daily. Test BS twice daily.   diclofenac sodium (VOLTAREN) 1 % GEL APP 4 GRAMS EXT AA QID UTD   fluticasone (FLONASE) 50 MCG/ACT nasal spray SHAKE LIQUID AND USE 2 SPRAYS IN EACH NOSTRIL DAILY   gabapentin (NEURONTIN) 600 MG tablet Take 1 tablet (600 mg total) by mouth 3 (three) times daily. (Patient taking differently: Take 600 mg by mouth in the morning, at noon, in the evening, and at bedtime. Patient takes 2 qam and 2 qpm)   JANUVIA 100 MG tablet TAKE 1 TABLET(100 MG) BY MOUTH DAILY   Lancets (ONETOUCH DELICA PLUS LANCET30G) MISC Use up to 4 times daily to test blood sugar as needed   metFORMIN (GLUCOPHAGE-XR) 500 MG 24 hr tablet TAKE 2 TABLETS BY MOUTH EVERY MORNING THEN TAKE 1 TABLET BY MOUTH IN THE EVENING   omeprazole (PRILOSEC) 20  MG capsule Take 1 capsule (20 mg total) by mouth 2 (two) times daily before a meal.   ONETOUCH ULTRA test strip USE TO TEST BLOOD SUGAR TWICE DAILY   oxyCODONE-acetaminophen (PERCOCET) 10-325 MG tablet Take 1 tablet by mouth every 4 (four) hours as needed.   pramipexole (MIRAPEX) 0.125 MG tablet Mirapex 0.125 mg tablet  1 to 2 in pm for RLS   tiZANidine (ZANAFLEX) 4 MG tablet Take 4 mg by mouth every 6 (six) hours as needed.        10/24/2021    9:28 AM 06/23/2021    3:02 PM 11/25/2019   10:05 AM 07/30/2019   10:16 AM  GAD 7 : Generalized Anxiety Score  Nervous, Anxious, on Edge 3 0 2 0  Control/stop worrying 3 0 0 0  Worry too much - different things 3 0 0 0    Trouble relaxing 3 0 0 1  Restless 2 0 0 0  Easily annoyed or irritable 2 0 0 0  Afraid - awful might happen 3 0 0 0  Total GAD 7 Score 19 0 2 1  Anxiety Difficulty Very difficult  Not difficult at all Not difficult at all       10/24/2021    9:27 AM 07/24/2021    8:29 AM 06/23/2021    3:01 PM  Depression screen PHQ 2/9  Decreased Interest _0 Down, Depressed, Hopeless 3 1 0  PHQ - 2 Score _1 Altered sleeping _2 Tired, decreased energy _3 Change in appetite _4 Feeling bad or failure about yourself  0 1 1  Trouble concentrating 2 0 0  Moving slowly or fidgety/restless 0 0 2  Suicidal thoughts 0 0 0  PHQ-9 Score _5 Difficult doing work/chores Somewhat difficult Not difficult at all Not difficult at all    BP Readings from Last 3 Encounters:  10/24/21 128/76  09/18/21 131/72  06/23/21 122/72    Physical Exam Vitals and nursing note reviewed.  Constitutional:      General: She is not in acute distress.    Appearance: She is well-developed.  HENT:     Head: Normocephalic and atraumatic.  Cardiovascular:     Rate and Rhythm: Normal rate and regular rhythm.  Pulmonary:     Effort: Pulmonary effort is normal. No respiratory distress.  Musculoskeletal:        General: Deformity present.      Cervical back: Normal range of motion.     Right lower leg: No edema.     Left lower leg: No edema.  Skin:    General: Skin is warm and dry.     Capillary Refill: Capillary refill takes less than 2 seconds.     Findings: No rash.  Neurological:     Mental Status: She is alert and oriented to person, place, and time.  Psychiatric:        Mood and Affect: Mood normal.        Behavior: Behavior normal.     Wt Readings from Last 3 Encounters:  10/24/21 136 lb 3.2 oz (61.8 kg)  09/18/21 143 lb 4.8 oz (65 kg)  06/23/21 147 lb (66.7 kg)    BP 128/76   Pulse 74   Temp 98.2 F (36.8 C) (Oral)   Ht 5' 6" (1.676 m)   Wt 136 lb 3.2 oz (61.8 kg)   SpO2 93%   BMI 21.98 kg/m   Assessment and Plan: 1. Type II diabetes mellitus with complication (HCC) Clinically stable by exam and report without s/s of hypoglycemia. DM complicated by dyslipidemia. Due for diabetic eye exam Tolerating medications well without side effects or other concerns. - Hemoglobin O1Y - Basic metabolic panel  2. Dysphagia, unspecified type Needs to see GI for EGD - Ambulatory referral to Gastroenterology  3. Pulmonary emphysema, unspecified emphysema type (HCC) Stable symptoms  - fluticasone-salmeterol (ADVAIR DISKUS) 250-50 MCG/ACT AEPB; INHALE 1 PUFF INTO THE LUNGS TWICE DAILY  Dispense: 60 each; Refill: 12  4. Unintentional weight change Will check thyroid function as a possible cause - TSH + free T4   Partially dictated using Editor, commissioning. Any errors are unintentional.  Halina Maidens, MD [facility]  10/24/2021

## 2021-10-24 NOTE — Telephone Encounter (Signed)
Requested medication (s) are due for refill today: no  Requested medication (s) are on the active medication list: yes  Last refill:  10/24/21  Future visit scheduled: yes  Notes to clinic:  Unable to refill per protocol, last refill by  provider 08/24/20, possible duplicate.     Requested Prescriptions  Pending Prescriptions Disp Refills   WIXELA INHUB 250-50 MCG/ACT AEPB [Pharmacy Med Name: WIXELA INHUB DISKUS 250/50MCG 60S] 60 each 0    Sig: INHALE 1 PUFF INTO THE LUNGS TWICE DAILY     Pulmonology:  Combination Products Passed - 10/20/2021  1:47 PM      Passed - Valid encounter within last 12 months    Recent Outpatient Visits           Today Type II diabetes mellitus with complication [facility])   [place] [facility] at [facility], Jesse Sans, MD   4 months ago Type II diabetes mellitus with complication [facility])   [place] [facility] Medicine at [facility], Jesse Sans, MD   1 year ago Type II diabetes mellitus with complication [facility])   [place] [facility] at [facility], Jesse Sans, MD   2 years ago Type II diabetes mellitus with complication [facility])   [place] [facility] at [facility], Jesse Sans, MD   2 years ago Annual physical exam   [facility] at [facility], Jesse Sans, MD       Future Appointments             In 4 months Army Melia, Jesse Sans, MD [facility] Primary Care and [facility] Medicine at [facility], [facility]   In 8 months Army Melia, Jesse Sans, MD [place] [facility] and [facility] Medicine at [facility], [facility]

## 2021-10-25 LAB — BASIC METABOLIC PANEL
BUN/Creatinine Ratio: 23 (ref 12–28)
BUN: 15 mg/dL (ref 8–27)
CO2: 23 mmol/L (ref 20–29)
Calcium: 9.6 mg/dL (ref 8.7–10.3)
Chloride: 100 mmol/L (ref 96–106)
Creatinine, Ser: 0.65 mg/dL (ref 0.57–1.00)
Glucose: 124 mg/dL — ABNORMAL HIGH (ref 70–99)
Potassium: 4.3 mmol/L (ref 3.5–5.2)
Sodium: 139 mmol/L (ref 134–144)
eGFR: 97 mL/min/{1.73_m2} (ref >59)

## 2021-10-25 LAB — HEMOGLOBIN A1C
Est. average glucose Bld gHb Est-mCnc: 171 mg/dL
Hgb A1c MFr Bld: 7.6 % — ABNORMAL HIGH (ref 4.8–5.6)

## 2021-10-25 LAB — TSH+FREE T4
Free T4: 1.22 ng/dL (ref 0.82–1.77)
TSH: 1.54 u[IU]/mL (ref 0.450–4.500)

## 2021-10-30 ENCOUNTER — Ambulatory Visit
Admission: RE | Admit: 2021-10-30 | Discharge: 2021-10-30 | Disposition: A | Discharge status: Home / Self Care | Payer: Medicare HMO | Source: Ambulatory Visit | Attending: Internal Medicine | Admitting: Internal Medicine

## 2021-10-30 ENCOUNTER — Other Ambulatory Visit: Payer: Self-pay | Admitting: Internal Medicine

## 2021-10-30 DIAGNOSIS — Z1231 Encounter for screening mammogram for malignant neoplasm of breast: Secondary | ICD-10-CM | POA: Insufficient documentation

## 2021-10-30 DIAGNOSIS — Z780 Asymptomatic menopausal state: Secondary | ICD-10-CM | POA: Diagnosis not present

## 2021-10-30 DIAGNOSIS — M810 Age-related osteoporosis without current pathological fracture: Secondary | ICD-10-CM | POA: Diagnosis not present

## 2021-10-30 DIAGNOSIS — M818 Other osteoporosis without current pathological fracture: Secondary | ICD-10-CM

## 2021-10-30 MED ORDER — ALENDRONATE SODIUM 70 MG PO TABS: 70.0000 mg | ORAL_TABLET | ORAL | 11 refills | Status: DC

## 2021-10-30 NOTE — Progress Notes (Signed)
Called pt could not leave VM. VM is full.  PEC nurse may give results to patient if they return call to clinic, a CRM has been created.   KP

## 2021-10-30 NOTE — Progress Notes (Signed)
Informed pt with labs. Pt verbalized understanding. She wants medication sent to [facility] in [place].  KP

## 2021-12-05 DIAGNOSIS — M47816 Spondylosis without myelopathy or radiculopathy, lumbar region: Secondary | ICD-10-CM | POA: Diagnosis not present

## 2021-12-05 DIAGNOSIS — G894 Chronic pain syndrome: Secondary | ICD-10-CM | POA: Diagnosis not present

## 2021-12-05 DIAGNOSIS — G2581 Restless legs syndrome: Secondary | ICD-10-CM | POA: Diagnosis not present

## 2021-12-05 DIAGNOSIS — E1140 Type 2 diabetes mellitus with diabetic neuropathy, unspecified: Secondary | ICD-10-CM | POA: Diagnosis not present

## 2021-12-05 DIAGNOSIS — M25551 Pain in right hip: Secondary | ICD-10-CM | POA: Diagnosis not present

## 2021-12-05 DIAGNOSIS — Z79899 Other long term (current) drug therapy: Secondary | ICD-10-CM | POA: Diagnosis not present

## 2021-12-05 DIAGNOSIS — Z5181 Encounter for therapeutic drug level monitoring: Secondary | ICD-10-CM | POA: Diagnosis not present

## 2021-12-05 DIAGNOSIS — Z96651 Presence of right artificial knee joint: Secondary | ICD-10-CM | POA: Diagnosis not present

## 2021-12-05 DIAGNOSIS — M5416 Radiculopathy, lumbar region: Secondary | ICD-10-CM | POA: Diagnosis not present

## 2021-12-05 DIAGNOSIS — R202 Paresthesia of skin: Secondary | ICD-10-CM | POA: Diagnosis not present

## 2021-12-14 ENCOUNTER — Encounter: Payer: Self-pay | Admitting: Gastroenterology

## 2021-12-14 ENCOUNTER — Ambulatory Visit (INDEPENDENT_AMBULATORY_CARE_PROVIDER_SITE_OTHER): Payer: Medicare HMO | Admitting: Gastroenterology

## 2021-12-14 VITALS — BP 153/82 | HR 69 | Temp 97.9°F | Ht 65.0 in | Wt 131.0 lb

## 2021-12-14 DIAGNOSIS — R1310 Dysphagia, unspecified: Secondary | ICD-10-CM

## 2021-12-14 NOTE — Progress Notes (Signed)
Primary Care Physician: Glean Hess, MD  Primary Gastroenterologist:  Dr. [name]  Chief Complaint  Patient presents with   New Patient (Initial Visit)   Dysphagia    HPI: Hannah [name] is a 66 y.o. female here who was seen by me back in July for colonoscopy.  The patient had seen her primary care provider back in September and reported some dysphagia with some unexplained weight loss that was attributed to stress.  The patient had reported to her primary care provider that the food felt like it was getting stuck in the mid esophagus. The patient reports that the symptoms are worse with solids and bulky foods rather than liquids.  She also thinks that some of the weight loss is because she is not eating as much.  She is having to bring up the food sometimes.  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 Take with a full glass of water on an empty stomach - do not eat or drink or recline for 30 minutes after dosing 4 tablet 11   atorvastatin (LIPITOR) 20 MG tablet TAKE 1 TABLET BY MOUTH DAILY 90 tablet 0   Blood Glucose Monitoring Suppl (ONETOUCH VERIO) w/Device KIT 1 each by Does not apply route 2 (two) times daily. Test BS twice daily. 1 kit 0   diclofenac sodium (VOLTAREN) 1 % GEL APP 4 GRAMS EXT AA QID UTD  3   fluticasone (FLONASE) 50 MCG/ACT nasal spray SHAKE LIQUID AND USE 2 SPRAYS IN  EACH NOSTRIL DAILY 16 g 6   fluticasone-salmeterol (ADVAIR DISKUS) 250-50 MCG/ACT AEPB INHALE 1 PUFF INTO THE LUNGS TWICE DAILY 60 each 12   gabapentin (NEURONTIN) 600 MG tablet Take 1 tablet (600 mg total) by mouth 3 (three) times daily. (Patient taking differently: Take 600 mg by mouth in the morning, at noon, in the evening, and at bedtime. Patient takes 2 qam and 2 qpm) 90 tablet 5   JANUVIA 100 MG tablet TAKE 1 TABLET(100 MG) BY MOUTH DAILY 90 tablet 0   Lancets (ONETOUCH DELICA PLUS [id number]) MISC Use up to 4 times daily to test blood sugar as needed 100 each 12   metFORMIN (GLUCOPHAGE-XR) 500 MG 24 hr tablet TAKE 2 TABLETS BY MOUTH EVERY MORNING THEN TAKE 1 TABLET BY MOUTH IN THE EVENING 270 tablet 0   omeprazole (PRILOSEC) 20 MG capsule Take 1 capsule (20 mg total) by mouth 2 (two) times daily before a meal. 90 capsule 1   ONETOUCH ULTRA test strip USE TO TEST BLOOD SUGAR TWICE DAILY 200 strip 6   oxyCODONE-acetaminophen (PERCOCET) 10-325 MG tablet Take 1 tablet by mouth every 4 (four) hours as needed.  0   pramipexole (MIRAPEX) 0.125 MG tablet Mirapex 0.125 mg tablet  1 to 2 in pm for RLS     tiZANidine (ZANAFLEX) 4 MG tablet  Take 4 mg by mouth every 6 (six) hours as needed.      No current facility-administered medications for this visit.    Allergies as of 12/14/2021 - Review Complete 12/14/2021  Allergen Reaction Noted   Levofloxacin Nausea And Vomiting 05/14/2016    ROS:  General: Negative for anorexia, weight loss, fever, chills, fatigue, weakness. ENT: Negative for hoarseness, difficulty swallowing , nasal congestion. CV: Negative for chest pain, angina, palpitations, dyspnea on exertion, peripheral edema.  Respiratory: Negative for dyspnea at rest, dyspnea on exertion, cough, sputum, wheezing.  GI: See history of present illness. GU:  Negative for dysuria, hematuria, urinary incontinence, urinary frequency, nocturnal urination.  Endo: Negative for unusual weight change.     Physical Examination:   BP (!) 153/82 (BP Location: Right Arm, Patient Position: Sitting, Cuff Size: Normal)   Pulse 69   Temp 97.9 F (36.6 C) (Oral)   Ht '5\' 5"'  (1.651 m)   Wt 131 lb (59.4 kg)   BMI 21.80 kg/m   General: Well-nourished, well-developed in no acute distress.  Eyes: No icterus. Conjunctivae pink. Lungs: Clear to auscultation bilaterally. Non-labored. Heart: Regular rate and rhythm, no murmurs rubs or gallops.  Abdomen: Bowel sounds are normal, nontender, nondistended, no hepatosplenomegaly or masses, no abdominal bruits or hernia , no rebound or guarding.   Extremities: No lower extremity edema. No clubbing or deformities. Neuro: Alert and oriented x 3.  Grossly intact. Skin: Warm and dry, no jaundice.   Psych: Alert and cooperative, normal mood and affect.  Labs:    Imaging Studies: No results found.  Assessment and Plan:   Hannah [name] is a 66 y.o. y/o female who comes in today with dysphagia.  The patient's dysphagia is to solids more than liquids.  The patient had a colonoscopy by me recently and tolerated the procedure well despite her lung issues.  The patient is getting over a cold right now and has a cough but otherwise is in her usual state of health.  The patient will be set up for an EGD.  The patient has been explained the plan agrees with it.     [name], MD. Marval Regal    Note: This dictation was prepared with Dragon dictation along with smaller phrase technology. Any transcriptional errors that result from this process are unintentional.

## 2021-12-14 NOTE — H&P (View-Only) (Signed)
Primary Care Physician: Glean Hess, MD  Primary Gastroenterologist:  Dr. [name]  Chief Complaint  Patient presents with   New Patient (Initial Visit)   Dysphagia    HPI: Hannah [name] is a 66 y.o. female here who was seen by me back in July for colonoscopy.  The patient had seen her primary care provider back in September and reported some dysphagia with some unexplained weight loss that was attributed to stress.  The patient had reported to her primary care provider that the food felt like it was getting stuck in the mid esophagus. The patient reports that the symptoms are worse with solids and bulky foods rather than liquids.  She also thinks that some of the weight loss is because she is not eating as much.  She is having to bring up the food sometimes.  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)    GERD (gastroesophageal reflux disease)    Hyperlipidemia    Mood disorder ([place]) 04/29/2019    Current Outpatient Medications  Medication Sig Dispense Refill   albuterol (VENTOLIN HFA) 108 (90 Base) MCG/ACT inhaler INHALE 2 PUFFS BY MOUTH EVERY 6 HOURS AS NEEDED FOR WHEEZING OR SHORTNESS OF BREATH 18 g 0   alendronate (FOSAMAX) 70 MG tablet Take 1 tablet (70 mg total) by mouth every 7 (seven) days. Take with a full glass of water on an empty stomach - do not eat or drink or recline for 30 minutes after dosing 4 tablet 11   atorvastatin (LIPITOR) 20 MG tablet TAKE 1 TABLET BY MOUTH DAILY 90 tablet 0   Blood Glucose Monitoring Suppl (ONETOUCH VERIO) w/Device KIT 1 each by Does not apply route 2 (two) times daily. Test BS twice daily. 1 kit 0   diclofenac sodium (VOLTAREN) 1 % GEL APP 4 GRAMS EXT AA QID UTD  3   fluticasone (FLONASE) 50 MCG/ACT nasal spray SHAKE LIQUID AND USE 2 SPRAYS IN  EACH NOSTRIL DAILY 16 g 6   fluticasone-salmeterol (ADVAIR DISKUS) 250-50 MCG/ACT AEPB INHALE 1 PUFF INTO THE LUNGS TWICE DAILY 60 each 12   gabapentin (NEURONTIN) 600 MG tablet Take 1 tablet (600 mg total) by mouth 3 (three) times daily. (Patient taking differently: Take 600 mg by mouth in the morning, at noon, in the evening, and at bedtime. Patient takes 2 qam and 2 qpm) 90 tablet 5   JANUVIA 100 MG tablet TAKE 1 TABLET(100 MG) BY MOUTH DAILY 90 tablet 0   Lancets (ONETOUCH DELICA PLUS [id number]) MISC Use up to 4 times daily to test blood sugar as needed 100 each 12   metFORMIN (GLUCOPHAGE-XR) 500 MG 24 hr tablet TAKE 2 TABLETS BY MOUTH EVERY MORNING THEN TAKE 1 TABLET BY MOUTH IN THE EVENING 270 tablet 0   omeprazole (PRILOSEC) 20 MG capsule Take 1 capsule (20 mg total) by mouth 2 (two) times daily before a meal. 90 capsule 1   ONETOUCH ULTRA test strip USE TO TEST BLOOD SUGAR TWICE DAILY 200 strip 6   oxyCODONE-acetaminophen (PERCOCET) 10-325 MG tablet Take 1 tablet by mouth every 4 (four) hours as needed.  0   pramipexole (MIRAPEX) 0.125 MG tablet Mirapex 0.125 mg tablet  1 to 2 in pm for RLS     tiZANidine (ZANAFLEX) 4 MG tablet  Take 4 mg by mouth every 6 (six) hours as needed.      No current facility-administered medications for this visit.    Allergies as of 12/14/2021 - Review Complete 12/14/2021  Allergen Reaction Noted   Levofloxacin Nausea And Vomiting 05/14/2016    ROS:  General: Negative for anorexia, weight loss, fever, chills, fatigue, weakness. ENT: Negative for hoarseness, difficulty swallowing , nasal congestion. CV: Negative for chest pain, angina, palpitations, dyspnea on exertion, peripheral edema.  Respiratory: Negative for dyspnea at rest, dyspnea on exertion, cough, sputum, wheezing.  GI: See history of present illness. GU:  Negative for dysuria, hematuria, urinary incontinence, urinary frequency, nocturnal urination.  Endo: Negative for unusual weight change.     Physical Examination:   BP (!) 153/82 (BP Location: Right Arm, Patient Position: Sitting, Cuff Size: Normal)   Pulse 69   Temp 97.9 F (36.6 C) (Oral)   Ht '5\' 5"'  (1.651 m)   Wt 131 lb (59.4 kg)   BMI 21.80 kg/m   General: Well-nourished, well-developed in no acute distress.  Eyes: No icterus. Conjunctivae pink. Lungs: Clear to auscultation bilaterally. Non-labored. Heart: Regular rate and rhythm, no murmurs rubs or gallops.  Abdomen: Bowel sounds are normal, nontender, nondistended, no hepatosplenomegaly or masses, no abdominal bruits or hernia , no rebound or guarding.   Extremities: No lower extremity edema. No clubbing or deformities. Neuro: Alert and oriented x 3.  Grossly intact. Skin: Warm and dry, no jaundice.   Psych: Alert and cooperative, normal mood and affect.  Labs:    Imaging Studies: No results found.  Assessment and Plan:   Hannah [name] is a 66 y.o. y/o female who comes in today with dysphagia.  The patient's dysphagia is to solids more than liquids.  The patient had a colonoscopy by me recently and tolerated the procedure well despite her lung issues.  The patient is getting over a cold right now and has a cough but otherwise is in her usual state of health.  The patient will be set up for an EGD.  The patient has been explained the plan agrees with it.     [name], MD. Marval Regal    Note: This dictation was prepared with Dragon dictation along with smaller phrase technology. Any transcriptional errors that result from this process are unintentional.

## 2021-12-14 NOTE — Addendum Note (Signed)
Addended by: Lurlean Nanny on: 12/14/2021 02:08 PM   Modules accepted: Orders

## 2021-12-21 ENCOUNTER — Other Ambulatory Visit: Payer: Self-pay | Admitting: Internal Medicine

## 2021-12-21 DIAGNOSIS — E1169 Type 2 diabetes mellitus with other specified complication: Secondary | ICD-10-CM

## 2021-12-21 NOTE — Telephone Encounter (Signed)
Requested Prescriptions  Pending Prescriptions Disp Refills   atorvastatin (LIPITOR) 20 MG tablet [Pharmacy Med Name: ATORVASTATIN '20MG'$  TABLETS] 90 tablet 0    Sig: TAKE 1 TABLET BY MOUTH DAILY     Cardiovascular:  Antilipid - Statins Failed - 12/21/2021 11:52 AM      Failed - Lipid Panel in normal range within the last 12 months    Cholesterol, Total  Date Value Ref Range Status  11/25/2019 134 100 - 199 mg/dL Final   LDL Chol Calc (NIH)  Date Value Ref Range Status  11/25/2019 63 0 - 99 mg/dL Final   HDL  Date Value Ref Range Status  11/25/2019 55 >39 mg/dL Final   Triglycerides  Date Value Ref Range Status  11/25/2019 86 0 - 149 mg/dL Final         Passed - Patient is not pregnant      Passed - Valid encounter within last 12 months    Recent Outpatient Visits           1 month ago Type II diabetes mellitus with complication ([place])   [place] [facility] at [facility], Jesse Sans, MD   6 months ago Type II diabetes mellitus with complication [facility])   [place] [facility] Medicine at [facility], Jesse Sans, MD   2 years ago Type II diabetes mellitus with complication [facility])   [place] Primary Care and [facility] Medicine at [facility], Jesse Sans, MD   2 years ago Type II diabetes mellitus with complication [facility])   [place] [facility] and Sports Medicine at [facility], Jesse Sans, MD   2 years ago Annual physical exam   [facility] Primary Care and Sports Medicine at [place], Jesse Sans, MD       Future Appointments             In 1 month Army Melia, Jesse Sans, MD [facility] Primary Care and Sports Medicine at [facility], [facility]   In 6 months Army Melia, Jesse Sans, MD [place] [facility] Care and Sports Medicine at [facility], [facility]

## 2021-12-22 ENCOUNTER — Encounter: Payer: Self-pay | Admitting: Gastroenterology

## 2021-12-29 ENCOUNTER — Encounter: Payer: Self-pay | Admitting: Gastroenterology

## 2021-12-29 ENCOUNTER — Ambulatory Visit
Admission: RE | Admit: 2021-12-29 | Discharge: 2021-12-29 | Disposition: A | Discharge status: Home / Self Care | Payer: Medicare HMO | Source: Ambulatory Visit | Attending: Gastroenterology | Admitting: Gastroenterology

## 2021-12-29 ENCOUNTER — Ambulatory Visit: Payer: Medicare HMO | Admitting: Anesthesiology

## 2021-12-29 ENCOUNTER — Encounter
Admission: RE | Disposition: A | Discharge status: Home / Self Care | Payer: Self-pay | Source: Ambulatory Visit | Attending: Gastroenterology

## 2021-12-29 ENCOUNTER — Other Ambulatory Visit: Payer: Self-pay

## 2021-12-29 DIAGNOSIS — J439 Emphysema, unspecified: Secondary | ICD-10-CM | POA: Diagnosis not present

## 2021-12-29 DIAGNOSIS — M1990 Unspecified osteoarthritis, unspecified site: Secondary | ICD-10-CM | POA: Insufficient documentation

## 2021-12-29 DIAGNOSIS — Z7984 Long term (current) use of oral hypoglycemic drugs: Secondary | ICD-10-CM | POA: Diagnosis not present

## 2021-12-29 DIAGNOSIS — K219 Gastro-esophageal reflux disease without esophagitis: Secondary | ICD-10-CM | POA: Diagnosis not present

## 2021-12-29 DIAGNOSIS — K2100 Gastro-esophageal reflux disease with esophagitis, without bleeding: Secondary | ICD-10-CM | POA: Diagnosis not present

## 2021-12-29 DIAGNOSIS — J449 Chronic obstructive pulmonary disease, unspecified: Secondary | ICD-10-CM | POA: Diagnosis not present

## 2021-12-29 DIAGNOSIS — E118 Type 2 diabetes mellitus with unspecified complications: Secondary | ICD-10-CM

## 2021-12-29 DIAGNOSIS — K222 Esophageal obstruction: Secondary | ICD-10-CM

## 2021-12-29 DIAGNOSIS — E119 Type 2 diabetes mellitus without complications: Secondary | ICD-10-CM | POA: Diagnosis not present

## 2021-12-29 DIAGNOSIS — G114 Hereditary spastic paraplegia: Secondary | ICD-10-CM | POA: Diagnosis not present

## 2021-12-29 DIAGNOSIS — G8929 Other chronic pain: Secondary | ICD-10-CM | POA: Insufficient documentation

## 2021-12-29 DIAGNOSIS — Z87891 Personal history of nicotine dependence: Secondary | ICD-10-CM | POA: Diagnosis not present

## 2021-12-29 DIAGNOSIS — R1310 Dysphagia, unspecified: Secondary | ICD-10-CM | POA: Diagnosis not present

## 2021-12-29 HISTORY — PX: ESOPHAGOGASTRODUODENOSCOPY (EGD) WITH PROPOFOL: SHX5813

## 2021-12-29 HISTORY — PX: ESOPHAGEAL DILATION: SHX303

## 2021-12-29 LAB — GLUCOSE, CAPILLARY: Glucose-Capillary: 176 mg/dL — ABNORMAL HIGH (ref 70–99)

## 2021-12-29 SURGERY — ESOPHAGOGASTRODUODENOSCOPY (EGD) WITH PROPOFOL
Anesthesia: General | Site: Esophagus

## 2021-12-29 MED ORDER — LACTATED RINGERS IV SOLN: INTRAVENOUS | Status: DC

## 2021-12-29 MED ORDER — LIDOCAINE HCL (CARDIAC) PF 100 MG/5ML IV SOSY
PREFILLED_SYRINGE | INTRAVENOUS | Status: DC | PRN
  Administered 2021-12-29: 60 mg via INTRAVENOUS

## 2021-12-29 MED ORDER — GLYCOPYRROLATE 0.2 MG/ML IJ SOLN
INTRAMUSCULAR | Status: DC | PRN
  Administered 2021-12-29: .2 mg via INTRAVENOUS

## 2021-12-29 MED ORDER — SODIUM CHLORIDE 0.9 % IV SOLN: INTRAVENOUS | Status: DC

## 2021-12-29 MED ORDER — PROPOFOL 10 MG/ML IV BOLUS
INTRAVENOUS | Status: DC | PRN
  Administered 2021-12-29: 30 mg via INTRAVENOUS
  Administered 2021-12-29: 70 mg via INTRAVENOUS
  Administered 2021-12-29: 20 mg via INTRAVENOUS

## 2021-12-29 SURGICAL SUPPLY — 32 items
BALLN DILATOR 12-15 8 (BALLOONS)
BALLN DILATOR 15-18 8 (BALLOONS) ×1
BALLN DILATOR CRE 0-12 8 (BALLOONS)
BALLN DILATOR ESOPH 8 10 CRE (MISCELLANEOUS) IMPLANT
BALLOON DILATOR 12-15 8 (BALLOONS) IMPLANT
BALLOON DILATOR 15-18 8 (BALLOONS) IMPLANT
BALLOON DILATOR CRE 0-12 8 (BALLOONS) IMPLANT
BLOCK BITE 60FR ADLT L/F GRN (MISCELLANEOUS) ×1 IMPLANT
CLIP HMST 235XBRD CATH ROT (MISCELLANEOUS) IMPLANT
CLIP RESOLUTION 360 11X235 (MISCELLANEOUS)
ELECT REM PT RETURN 9FT ADLT (ELECTROSURGICAL)
ELECTRODE REM PT RTRN 9FT ADLT (ELECTROSURGICAL) IMPLANT
FCP ESCP3.2XJMB 240X2.8X (MISCELLANEOUS) ×1
FORCEPS BIOP RAD 4 LRG CAP 4 (CUTTING FORCEPS) IMPLANT
FORCEPS BIOP RJ4 240 W/NDL (MISCELLANEOUS) ×1
FORCEPS ESCP3.2XJMB 240X2.8X (MISCELLANEOUS) IMPLANT
GOWN CVR UNV OPN BCK APRN NK (MISCELLANEOUS) ×2 IMPLANT
GOWN ISOL THUMB LOOP REG UNIV (MISCELLANEOUS) ×2
INJECTOR VARIJECT VIN23 (MISCELLANEOUS) IMPLANT
KIT DEFENDO VALVE AND CONN (KITS) IMPLANT
KIT PRC NS LF DISP ENDO (KITS) ×1 IMPLANT
KIT PROCEDURE OLYMPUS (KITS) ×1
MANIFOLD NEPTUNE II (INSTRUMENTS) ×1 IMPLANT
MARKER SPOT ENDO TATTOO 5ML (MISCELLANEOUS) IMPLANT
RETRIEVER NET PLAT FOOD (MISCELLANEOUS) IMPLANT
SNARE SHORT THROW 13M SML OVAL (MISCELLANEOUS) IMPLANT
SNARE SHORT THROW 30M LRG OVAL (MISCELLANEOUS) IMPLANT
SYR INFLATION 60ML (SYRINGE) IMPLANT
TRAP ETRAP POLY (MISCELLANEOUS) IMPLANT
VARIJECT INJECTOR VIN23 (MISCELLANEOUS)
WATER STERILE IRR 250ML POUR (IV SOLUTION) ×1 IMPLANT
WIRE CRE 18-20MM 8CM F G (MISCELLANEOUS) IMPLANT

## 2021-12-29 NOTE — Interval H&P Note (Signed)
[name], MD [facility] [address] [place], [place] [address] [phone] Fax : [phone]  Primary Care Physician:  Glean Hess, MD Primary Gastroenterologist:  Dr. Allen Norris  Pre-Procedure History & Physical: HPI:  Hannah [name] is a 66 y.o. female is here for an endoscopy.   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[place])    GERD (gastroesophageal reflux disease)    Hyperlipidemia    Mood disorder ([place]) 04/29/2019    Past Surgical History:  Procedure Laterality Date   COLONOSCOPY WITH PROPOFOL N/A 03/05/2016   Procedure: COLONOSCOPY WITH PROPOFOL;  Surgeon: [name], MD;  Location: [place];  Service: Endoscopy;  Laterality: N/A;   COLONOSCOPY WITH PROPOFOL N/A 09/18/2021   Procedure: COLONOSCOPY WITH PROPOFOL;  Surgeon: [name], MD;  Location: [place];  Service: Endoscopy;  Laterality: N/A;   HIP FRACTURE SURGERY Right 03/2013   INNER EAR SURGERY Bilateral    REPLACEMENT TOTAL KNEE Right 02/2015   SHOULDER SURGERY Bilateral    rotator cuff    Prior to Admission medications   Medication Sig Start Date End Date Taking? Authorizing Provider  albuterol (VENTOLIN HFA) 108 (90 Base) MCG/ACT inhaler INHALE 2 PUFFS BY MOUTH EVERY 6 HOURS AS NEEDED FOR WHEEZING OR SHORTNESS OF BREATH 11/17/20  Yes Glean Hess, MD  alendronate (FOSAMAX) 70 MG tablet Take 1 tablet (70 mg total) by mouth every 7 (seven) days. Take with a full glass of water on an empty stomach - do not eat or drink or recline for 30 minutes after dosing 10/30/21  Yes Glean Hess, MD  atorvastatin (LIPITOR) 20 MG tablet TAKE 1 TABLET BY MOUTH DAILY 12/21/21  Yes Glean Hess, MD  diclofenac sodium (VOLTAREN) 1 % GEL APP 4 GRAMS EXT AA QID UTD 12/05/17  Yes [provider]  fluticasone (FLONASE) 50 MCG/ACT nasal spray SHAKE LIQUID AND USE 2 SPRAYS IN EACH NOSTRIL DAILY 05/28/20  Yes Glean Hess, MD  fluticasone-salmeterol (ADVAIR DISKUS) 250-50 MCG/ACT AEPB INHALE 1 PUFF INTO THE LUNGS TWICE DAILY 10/24/21  Yes Glean Hess, MD  gabapentin (NEURONTIN) 600 MG tablet Take 1 tablet (600 mg total) by mouth 3 (three) times daily. Patient taking differently: Take 600 mg by mouth in the morning, at noon, in the evening, and at bedtime. Patient takes 2 qam and 2 qpm 08/25/15  Yes Glean Hess, MD  JANUVIA 100 MG tablet TAKE 1 TABLET(100 MG) BY MOUTH DAILY 10/18/21  Yes Glean Hess, MD  metFORMIN (GLUCOPHAGE-XR) 500 MG 24 hr tablet TAKE 2 TABLETS BY MOUTH EVERY MORNING THEN TAKE 1 TABLET BY MOUTH IN THE EVENING 10/18/21  Yes Glean Hess, MD  omeprazole (PRILOSEC) 20 MG capsule Take 1 capsule (20 mg total) by mouth 2 (two) times daily before a meal. 06/23/21  Yes Glean Hess, MD  oxyCODONE-acetaminophen (PERCOCET) 10-325 MG tablet Take 1 tablet by mouth every 4 (four) hours as needed. 12/05/17  Yes [provider]  pramipexole (MIRAPEX) 0.125 MG tablet Mirapex 0.125 mg tablet  1 to 2 in pm for RLS   Yes [provider]  tiZANidine (ZANAFLEX) 4 MG tablet Take 4 mg by mouth every 6 (six) hours as needed.  10/01/12  Yes [provider]  Blood Glucose Monitoring  Suppl (ONETOUCH VERIO) w/Device KIT 1 each by Does not apply route 2 (two) times daily. Test BS twice daily. 01/10/18   Glean Hess, MD  Lancets (ONETOUCH DELICA PLUS [id number]) MISC Use up to 4 times daily to test blood sugar as needed 04/01/20   Glean Hess, MD  [place] ULTRA test strip USE TO TEST BLOOD SUGAR TWICE DAILY 09/21/21   Glean Hess, MD    Allergies as of 12/14/2021 - Review Complete 12/14/2021  Allergen Reaction Noted   Levofloxacin Nausea And Vomiting 05/14/2016    Family History  Problem Relation Age of Onset   Cervical cancer  Mother    Colon cancer Mother    Diabetes Father    Heart disease Father    Breast cancer Sister 30   Breast cancer Cousin 40       pat cousin   Diabetes Sister    Diabetes Sister    Congestive Heart Failure Paternal Grandmother    Diabetes Paternal Grandmother     Social History   Socioeconomic History   Marital status: Widowed    Spouse name: Not on file   Number of children: 2   Years of education: Not on file   Highest education level: Some college, no degree  Occupational History   Occupation: retired  Tobacco Use   Smoking status: Former    Packs/day: 0.50    Years: 25.00    Total pack years: 12.50    Types: Cigarettes    Quit date: 03/05/2016    Years since quitting: 5.8   Smokeless tobacco: Never  Vaping Use   Vaping Use: Never used  Substance and Sexual Activity   Alcohol use: No   Drug use: No   Sexual activity: Never  Other Topics Concern   Not on file  Social History Narrative   Pt's son lives with her; he is recovering from TBI due to motorcycle accident 09/10/19   Social Determinants of Health   Financial Resource Strain: Low Risk  (07/24/2021)   Overall Financial Resource Strain (CARDIA)    Difficulty of Paying Living Expenses: Not hard at all  Food Insecurity: No Food Insecurity (07/24/2021)   Hunger Vital Sign    Worried About Running Out of Food in the Last Year: Never true    [place] in the Last Year: Never true  Transportation Needs: No Transportation Needs (07/24/2021)   PRAPARE - Hydrologist (Medical): No    Lack of Transportation (Non-Medical): No  Physical Activity: Inactive (07/24/2021)   Exercise Vital Sign    Days of Exercise per Week: 0 days    Minutes of Exercise per Session: 0 min  Stress: No Stress Concern Present (07/24/2021)   [place]    Feeling of Stress : Not at all  Social Connections: Socially Isolated (07/24/2021)   Social  Connection and Isolation Panel [NHANES]    Frequency of Communication with Friends and Family: More than three times a week    Frequency of Social Gatherings with Friends and Family: More than three times a week    Attends Religious Services: Never    Marine scientist or Organizations: No    Attends Archivist Meetings: Never    Marital Status: Widowed  Intimate Partner Violence: Not At Risk (07/24/2021)   Humiliation, Afraid, Rape, and Kick questionnaire    Fear of Current or Ex-Partner: No  Emotionally Abused: No    Physically Abused: No    Sexually Abused: No    Review of Systems: See HPI, otherwise negative ROS  Physical Exam: BP 136/72   Pulse 66   Temp 98.4 F (36.9 C)   Ht _0  (1.651 m)   Wt 62.1 kg   SpO2 93%   BMI 22.80 kg/m  General:   Alert,  pleasant and cooperative in NAD Head:  Normocephalic and atraumatic. Neck:  Supple; no masses or thyromegaly. Lungs:  Clear throughout to auscultation.    Heart:  Regular rate and rhythm. Abdomen:  Soft, nontender and nondistended. Normal bowel sounds, without guarding, and without rebound.   Neurologic:  Alert and  oriented x4;  grossly normal neurologically.  Impression/Plan: KAMERA DUBAS is here for an endoscopy to be performed for dysphagia  Risks, benefits, limitations, and alternatives regarding  endoscopy have been reviewed with the patient.  Questions have been answered.  All parties agreeable.   [name], MD  12/29/2021, 7:12 AM

## 2021-12-29 NOTE — Anesthesia Preprocedure Evaluation (Addendum)
Anesthesia Evaluation  Patient identified by MRN, date of birth, ID band Patient awake    Reviewed: Allergy & Precautions, NPO status , Patient's Chart, lab work & pertinent test results  History of Anesthesia Complications Negative for: history of anesthetic complications  Airway Mallampati: II  TM Distance: >3 FB Neck ROM: full    Dental  (+) Missing, Poor Dentition, Edentulous Upper, Chipped,  Serveral missing; :   Pulmonary neg shortness of breath, neg sleep apnea, COPD (mild),  COPD inhaler, Patient abstained from smoking., former smoker   Pulmonary exam normal        Cardiovascular Exercise Tolerance: Good (-) angina (-) Past MI negative cardio ROS (-) dysrhythmias  Rhythm:Regular Rate:Normal + Systolic murmurs Lipids    Neuro/Psych  PSYCHIATRIC DISORDERS      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;  DDD; chronic pain;      GI/Hepatic Neg liver ROS,GERD  Medicated,,dysphagia    Endo/Other  diabetes, Type 2    Renal/GU negative Renal ROS  negative genitourinary   Musculoskeletal  (+) Arthritis ,    Abdominal Normal abdominal exam  (+)   Peds  Hematology negative hematology ROS (+)   Anesthesia Other Findings Spastic paraplegia > avoid Succ  Reproductive/Obstetrics                             Anesthesia Physical Anesthesia Plan  ASA: 3  Anesthesia Plan: General   Post-op Pain Management: Minimal or no pain anticipated   Induction: Intravenous  PONV Risk Score and Plan: 3 and Treatment may vary due to age or medical condition and Propofol infusion  Airway Management Planned: Natural Airway  Additional Equipment: None  Intra-op Plan:   Post-operative Plan:   Informed Consent: I have reviewed the patients History and Physical, chart, labs and discussed the procedure including the risks, benefits and alternatives for the proposed anesthesia with the patient or authorized  representative who has indicated his/her understanding and acceptance.     Dental advisory given  Plan Discussed with: CRNA  Anesthesia Plan Comments: (Discussed risks of anesthesia with patient, including possibility of difficulty with spontaneous ventilation under anesthesia necessitating airway intervention, PONV, and rare risks such as cardiac or respiratory or neurological events, and allergic reactions. Discussed the role of CRNA in patient's perioperative care. Patient understands.)        Anesthesia Quick Evaluation

## 2021-12-29 NOTE — Anesthesia Postprocedure Evaluation (Signed)
Anesthesia Post Note  Patient: Hannah [name]  Procedure(s) Performed: ESOPHAGOGASTRODUODENOSCOPY (EGD) WITH PROPOFOL (Esophagus) ESOPHAGEAL DILATION (Esophagus)  Patient location during evaluation: PACU Anesthesia Type: General Level of consciousness: awake and alert Pain management: pain level controlled Vital Signs Assessment: post-procedure vital signs reviewed and stable Respiratory status: spontaneous breathing, nonlabored ventilation, respiratory function stable and patient connected to nasal cannula oxygen Cardiovascular status: blood pressure returned to baseline and stable Postop Assessment: no apparent nausea or vomiting Anesthetic complications: no  No notable events documented.   Last Vitals:  Vitals:   12/29/21 0745 12/29/21 0755  BP: 117/68 116/70  Pulse: 63 63  Resp: 15 17  Temp: (!) 36.3 C (!) 36.3 C  SpO2: 95% 93%    Last Pain:  Vitals:   12/29/21 0745  PainSc: [place]

## 2021-12-29 NOTE — Op Note (Signed)
[facility] Patient Name: Hannah [name] Procedure Date: 12/29/2021 7:25 AM MRN: [id number] Account #: [id number] Date of Birth: 07-22-1955 Admit Type: Outpatient Age: 66 Room: [facility] OR ROOM 01 Gender: Female Note Status: Finalized Instrument Name: [id number] Procedure:             Upper GI endoscopy Indications:           Dysphagia Providers:             [name] MD, MD Referring MD:          Halina Maidens, MD (Referring MD) Medicines:             Propofol per Anesthesia Complications:         No immediate complications. Procedure:             Pre-Anesthesia Assessment:                        - Prior to the procedure, a History and Physical was                         performed, and patient medications and allergies were                         reviewed. The patient's tolerance of previous                         anesthesia was also reviewed. The risks and benefits                         of the procedure and the sedation options and risks                         were discussed with the patient. All questions were                         answered, and informed consent was obtained. Prior                         Anticoagulants: The patient has taken no anticoagulant                         or antiplatelet agents. ASA Grade Assessment: II - A                         patient with mild systemic disease. After reviewing                         the risks and benefits, the patient was deemed in                         satisfactory condition to undergo the procedure.                        After obtaining informed consent, the endoscope was                         passed under direct vision. Throughout the procedure,  the patient's blood pressure, pulse, and oxygen                         saturations were monitored continuously. The Endoscope                         was introduced through the mouth, and advanced to the                          second part of duodenum. The upper GI endoscopy was                         accomplished without difficulty. The patient tolerated                         the procedure well. Findings:      One benign-appearing, intrinsic mild stenosis was found at the       gastroesophageal junction. The stenosis was traversed. A TTS dilator was       passed through the scope. Dilation with a 15-16.5-18 mm balloon dilator       was performed to 18 mm. The dilation site was examined and showed       complete resolution of luminal narrowing. Biopsies were obtained in the       middle third of the esophagus with cold forceps for histology.      The stomach was normal.      The examined duodenum was normal. Impression:            - Benign-appearing esophageal stenosis. Dilated.                        - Normal stomach.                        - Normal examined duodenum.                        - Biopsies were obtained in the middle third of the                         esophagus. Recommendation:        - Discharge patient to home.                        - Resume previous diet.                        - Continue present medications.                        - Await pathology results. Procedure Code(s):     --- Professional ---                        [phone], Esophagogastroduodenoscopy, flexible,                         transoral; with transendoscopic balloon dilation of                         esophagus (less than 30 mm diameter)  16837, 59, Esophagogastroduodenoscopy, flexible,                         transoral; with biopsy, single or multiple Diagnosis Code(s):     --- Professional ---                        R13.10, Dysphagia, unspecified                        K22.2, Esophageal obstruction CPT copyright 2022 American Medical Association. All rights reserved. The codes documented in this report are preliminary and upon coder review may  be revised to meet current compliance  requirements. [name] MD, MD 12/29/2021 7:41:57 AM This report has been signed electronically. Number of Addenda: 0 Note Initiated On: 12/29/2021 7:25 AM Total Procedure Duration: 0 hours 5 minutes 54 seconds  Estimated Blood Loss:  Estimated blood loss: none.      [facility]

## 2021-12-29 NOTE — Transfer of Care (Signed)
Immediate Anesthesia Transfer of Care Note  Patient: Hannah [name]  Procedure(s) Performed: ESOPHAGOGASTRODUODENOSCOPY (EGD) WITH PROPOFOL (Esophagus) ESOPHAGEAL DILATION (Esophagus)  Patient Location: PACU  Anesthesia Type: General  Level of Consciousness: awake, alert  and patient cooperative  Airway and Oxygen Therapy: Patient Spontanous Breathing and Patient connected to supplemental oxygen  Post-op Assessment: Post-op Vital signs reviewed, Patient's Cardiovascular Status Stable, Respiratory Function Stable, Patent Airway and No signs of Nausea or vomiting  Post-op Vital Signs: Reviewed and stable  Complications: No notable events documented.

## 2022-01-01 ENCOUNTER — Encounter: Payer: Self-pay | Admitting: Gastroenterology

## 2022-01-01 LAB — SURGICAL PATHOLOGY

## 2022-01-22 ENCOUNTER — Other Ambulatory Visit: Payer: Self-pay | Admitting: Internal Medicine

## 2022-01-22 DIAGNOSIS — E785 Hyperlipidemia, unspecified: Secondary | ICD-10-CM

## 2022-01-22 DIAGNOSIS — R1310 Dysphagia, unspecified: Secondary | ICD-10-CM

## 2022-01-22 NOTE — Telephone Encounter (Signed)
Pt states that she has been out of her medication for 3 months. Wants a call back if this request is denied. Please advise

## 2022-01-30 DIAGNOSIS — M47816 Spondylosis without myelopathy or radiculopathy, lumbar region: Secondary | ICD-10-CM | POA: Diagnosis not present

## 2022-01-30 DIAGNOSIS — E1140 Type 2 diabetes mellitus with diabetic neuropathy, unspecified: Secondary | ICD-10-CM | POA: Diagnosis not present

## 2022-01-30 DIAGNOSIS — G894 Chronic pain syndrome: Secondary | ICD-10-CM | POA: Diagnosis not present

## 2022-01-30 DIAGNOSIS — Z79899 Other long term (current) drug therapy: Secondary | ICD-10-CM | POA: Diagnosis not present

## 2022-01-30 DIAGNOSIS — M5416 Radiculopathy, lumbar region: Secondary | ICD-10-CM | POA: Diagnosis not present

## 2022-01-30 DIAGNOSIS — M19049 Primary osteoarthritis, unspecified hand: Secondary | ICD-10-CM | POA: Diagnosis not present

## 2022-01-30 DIAGNOSIS — R202 Paresthesia of skin: Secondary | ICD-10-CM | POA: Diagnosis not present

## 2022-01-30 DIAGNOSIS — G2581 Restless legs syndrome: Secondary | ICD-10-CM | POA: Diagnosis not present

## 2022-01-30 DIAGNOSIS — Z96651 Presence of right artificial knee joint: Secondary | ICD-10-CM | POA: Diagnosis not present

## 2022-02-16 ENCOUNTER — Encounter: Payer: Self-pay | Admitting: Internal Medicine

## 2022-02-16 ENCOUNTER — Ambulatory Visit (INDEPENDENT_AMBULATORY_CARE_PROVIDER_SITE_OTHER): Payer: Medicare HMO | Admitting: Internal Medicine

## 2022-02-16 VITALS — BP 100/58 | HR 62 | Ht 65.0 in | Wt 135.4 lb

## 2022-02-16 DIAGNOSIS — M159 Polyosteoarthritis, unspecified: Secondary | ICD-10-CM

## 2022-02-16 DIAGNOSIS — E118 Type 2 diabetes mellitus with unspecified complications: Secondary | ICD-10-CM | POA: Diagnosis not present

## 2022-02-16 DIAGNOSIS — E785 Hyperlipidemia, unspecified: Secondary | ICD-10-CM

## 2022-02-16 DIAGNOSIS — E1169 Type 2 diabetes mellitus with other specified complication: Secondary | ICD-10-CM

## 2022-02-16 DIAGNOSIS — Z23 Encounter for immunization: Secondary | ICD-10-CM

## 2022-02-16 MED ORDER — METFORMIN HCL ER 500 MG PO TB24: 1000.0000 mg | ORAL_TABLET | Freq: Two times a day (BID) | ORAL | 3 refills | Status: DC

## 2022-02-16 MED ORDER — JANUVIA 100 MG PO TABS: ORAL_TABLET | ORAL | 3 refills | Status: DC

## 2022-02-16 NOTE — Assessment & Plan Note (Signed)
Tolerating statin medication without side effects at this time LDL is at goal of < 70 on current dose Continue same therapy without change at this time.

## 2022-02-16 NOTE — Assessment & Plan Note (Addendum)
Clinically stable by exam and report without s/s of hypoglycemia. DM complicated by hypertension and dyslipidemia. Tolerating medications well without side effects or other concerns. She is reminded to schedule a DM eye exam

## 2022-02-16 NOTE — Assessment & Plan Note (Signed)
TKA scheduled for January

## 2022-02-16 NOTE — Progress Notes (Signed)
Date:  02/16/2022   Name:  Hannah [name]   DOB:  [birth year]   MRN:  [id number]   Chief Complaint: Diabetes  Diabetes She presents for her follow-up diabetic visit. She has type 2 diabetes mellitus. Her disease course has been stable. Hypoglycemia symptoms include nervousness/anxiousness. Pertinent negatives for hypoglycemia include no dizziness, headaches or tremors. Pertinent negatives for diabetes include no chest pain, no fatigue, no polydipsia, no polyuria and no weakness. Current diabetic treatment includes oral agent (monotherapy) (metformin, januvia). An ACE inhibitor/angiotensin II receptor blocker is not being taken. Eye exam is not current.  Hyperlipidemia This is a chronic problem. The problem is controlled. Pertinent negatives include no chest pain or shortness of breath. Current antihyperlipidemic treatment includes statins. The current treatment provides significant improvement of lipids.    Lab Results  Component Value Date   NA 139 10/24/2021   K 4.3 10/24/2021   CO2 23 10/24/2021   GLUCOSE 124 (H) 10/24/2021   BUN 15 10/24/2021   CREATININE 0.65 10/24/2021   CALCIUM 9.6 10/24/2021   EGFR 97 10/24/2021   GFRNONAA 98 11/25/2019   Lab Results  Component Value Date   CHOL 134 11/25/2019   HDL 55 11/25/2019   LDLCALC 63 11/25/2019   TRIG 86 11/25/2019   CHOLHDL 2.4 11/25/2019   Lab Results  Component Value Date   TSH 1.540 10/24/2021   Lab Results  Component Value Date   HGBA1C 7.6 (H) 10/24/2021   Lab Results  Component Value Date   WBC 12.0 (H) 06/23/2021   HGB 14.8 06/23/2021   HCT 42.4 06/23/2021   MCV 93 06/23/2021   PLT 224 06/23/2021   Lab Results  Component Value Date   ALT 18 06/23/2021   AST 13 06/23/2021   ALKPHOS 60 06/23/2021   BILITOT <0.2 06/23/2021   No results found for: "25OHVITD2", "25OHVITD3", "VD25OH"   Review of Systems  Constitutional:  Negative for appetite change, fatigue, fever and unexpected weight change.  HENT:   Negative for nosebleeds, tinnitus and trouble swallowing.   Eyes:  Negative for visual disturbance.  Respiratory:  Negative for cough, chest tightness, shortness of breath and wheezing.   Cardiovascular:  Negative for chest pain, palpitations and leg swelling.  Gastrointestinal:  Negative for abdominal pain, constipation and diarrhea.  Endocrine: Negative for polydipsia and polyuria.  Genitourinary:  Negative for dysuria and hematuria.  Musculoskeletal:  Positive for arthralgias and gait problem.  Neurological:  Negative for dizziness, tremors, weakness, light-headedness, numbness and headaches.  Psychiatric/Behavioral:  Positive for dysphoric mood and sleep disturbance. The patient is nervous/anxious.     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   Tobacco use disorder, mild, 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   Degenerative joint disease involving multiple joints 09/28/2013    Allergies  Allergen Reactions   Levofloxacin Nausea And Vomiting    Past Surgical History:  Procedure Laterality Date   COLONOSCOPY WITH PROPOFOL N/A 03/05/2016  Procedure: COLONOSCOPY WITH PROPOFOL;  Surgeon: [name], MD;  Location: [place];  Service: Endoscopy;  Laterality: N/A;   COLONOSCOPY WITH PROPOFOL N/A 09/18/2021   Procedure: COLONOSCOPY WITH PROPOFOL;  Surgeon: [name], MD;  Location:  [place];  Service: Endoscopy;  Laterality: N/A;   ESOPHAGEAL DILATION N/A 12/29/2021   Procedure: ESOPHAGEAL DILATION;  Surgeon: [name], MD;  Location: [place];  Service: Endoscopy;  Laterality: N/A;   ESOPHAGOGASTRODUODENOSCOPY (EGD) WITH PROPOFOL N/A 12/29/2021   Procedure: ESOPHAGOGASTRODUODENOSCOPY (EGD) WITH PROPOFOL;  Surgeon: [name], MD;  Location: [place];  Service: Endoscopy;  Laterality: N/A;  Diabetic   HIP FRACTURE SURGERY Right 03/2013   INNER EAR SURGERY Bilateral    REPLACEMENT TOTAL KNEE Right 02/2015   SHOULDER SURGERY Bilateral    rotator cuff    Social History   Tobacco Use   Smoking status: Former    Packs/day: 0.50    Years: 25.00    Total pack years: 12.50    Types: Cigarettes    Quit date: 03/05/2016    Years since quitting: 5.9   Smokeless tobacco: Never  Vaping Use   Vaping Use: Never used  Substance Use Topics   Alcohol use: No   Drug use: No     Medication list has been reviewed and updated.  Current Meds  Medication Sig   albuterol (VENTOLIN HFA) 108 (90 Base) MCG/ACT inhaler INHALE 2 PUFFS BY MOUTH EVERY 6 HOURS AS NEEDED FOR WHEEZING OR SHORTNESS OF BREATH   alendronate (FOSAMAX) 70 MG tablet Take 1 tablet (70 mg total) by mouth every 7 (seven) days. Take with a full glass of water on an empty stomach - do not eat or drink or recline for 30 minutes after dosing   atorvastatin (LIPITOR) 20 MG tablet TAKE 1 TABLET BY MOUTH DAILY   Blood Glucose Monitoring Suppl (ONETOUCH VERIO) w/Device KIT 1 each by Does not apply route 2 (two) times daily. Test BS twice daily.   fluticasone (FLONASE) 50 MCG/ACT nasal spray SHAKE LIQUID AND USE 2 SPRAYS IN EACH NOSTRIL DAILY   fluticasone-salmeterol (ADVAIR DISKUS) 250-50 MCG/ACT AEPB INHALE 1 PUFF INTO THE LUNGS TWICE DAILY   gabapentin (NEURONTIN) 600 MG tablet Take 1 tablet (600 mg total) by mouth 3 (three) times daily. (Patient taking differently: Take 600 mg by  mouth in the morning, at noon, in the evening, and at bedtime. Patient takes 2 qam and 2 qpm)   Lancets (ONETOUCH DELICA PLUS [id number]) MISC Use up to 4 times daily to test blood sugar as needed   omeprazole (PRILOSEC) 20 MG capsule TAKE 1 CAPSULE(20 MG) BY MOUTH TWICE DAILY BEFORE A MEAL   ONETOUCH ULTRA test strip USE TO TEST BLOOD SUGAR TWICE DAILY   oxyCODONE-acetaminophen (PERCOCET) 10-325 MG tablet Take 1 tablet by mouth every 4 (four) hours as needed.   pramipexole (MIRAPEX) 0.125 MG tablet Mirapex 0.125 mg tablet  1 to 2 in pm for RLS   tiZANidine (ZANAFLEX) 4 MG tablet Take 4 mg by mouth every 6 (six) hours as needed.    [DISCONTINUED] JANUVIA 100 MG tablet TAKE 1 TABLET(100 MG) BY MOUTH DAILY   [DISCONTINUED] metFORMIN (GLUCOPHAGE-XR) 500 MG 24 hr tablet TAKE 2 TABLETS BY MOUTH EVERY MORNING THEN TAKE 1 TABLET BY MOUTH IN THE EVENING       02/16/2022   10:23 AM 10/24/2021    9:28 AM 06/23/2021    3:02 PM 11/25/2019   10:05 AM  GAD 7 :  Generalized Anxiety Score  Nervous, Anxious, on Edge 1 3 0 2  Control/stop worrying 1 3 0 0  Worry too much - different things 2 3 0 0  Trouble relaxing 2 3 0 0  Restless 2 2 0 0  Easily annoyed or irritable 3 2 0 0  Afraid - awful might happen 3 3 0 0  Total GAD 7 Score 14 19 0 2  Anxiety Difficulty Extremely difficult Very difficult  Not difficult at all       02/16/2022   10:23 AM 10/24/2021    9:27 AM 07/24/2021    8:29 AM  Depression screen PHQ 2/9  Decreased Interest _0 Down, Depressed, Hopeless _1 PHQ - 2 Score _2 Altered sleeping _3 Tired, decreased energy _4 Change in appetite _5 Feeling bad or failure about yourself  2 0 1  Trouble concentrating 1 2 0  Moving slowly or fidgety/restless 2 0 0  Suicidal thoughts 0 0 0  PHQ-9 Score _6 Difficult doing work/chores Very difficult Somewhat difficult Not difficult at all    BP Readings from Last 3 Encounters:  02/16/22 (!) 100/58  12/29/21 116/70   12/14/21 (!) 153/82    Physical Exam Vitals and nursing note reviewed.  Constitutional:      General: She is not in acute distress.    Appearance: She is well-developed.  HENT:     Head: Normocephalic and atraumatic.  Cardiovascular:     Rate and Rhythm: Normal rate and regular rhythm.     Heart sounds: No murmur heard. Pulmonary:     Effort: Pulmonary effort is normal. No respiratory distress.     Breath sounds: No wheezing or rhonchi.  Lymphadenopathy:     Cervical: No cervical adenopathy.  Skin:    General: Skin is warm and dry.     Findings: No rash.  Neurological:     Mental Status: She is alert and oriented to person, place, and time.     Gait: Gait abnormal.  Psychiatric:        Mood and Affect: Mood normal.        Behavior: Behavior normal.     Wt Readings from Last 3 Encounters:  02/16/22 135 lb 6.4 oz (61.4 kg)  12/29/21 137 lb (62.1 kg)  12/14/21 131 lb (59.4 kg)    BP (!) 100/58   Pulse 62   Ht _7  (1.651 m)   Wt 135 lb 6.4 oz (61.4 kg)   SpO2 92%   BMI 22.53 kg/m   Assessment and Plan: Problem List Items Addressed This Visit       Endocrine   Hyperlipidemia associated with type 2 diabetes mellitus ([place]) (Chronic)    Tolerating statin medication without side effects at this time LDL is at goal of < 70 on current dose Continue same therapy without change at this time.       Relevant Medications   JANUVIA 100 MG tablet   metFORMIN (GLUCOPHAGE-XR) 500 MG 24 hr tablet   Other Relevant Orders   Lipid panel   Comprehensive metabolic panel   Type II diabetes mellitus with complication (HCC) - Primary (Chronic)    Clinically stable by exam and report without s/s of hypoglycemia. DM complicated by hypertension and dyslipidemia. Tolerating medications well without side effects or other concerns. She is reminded to schedule a DM eye exam  Relevant Medications   JANUVIA 100 MG tablet   metFORMIN (GLUCOPHAGE-XR) 500 MG 24 hr tablet    Other Relevant Orders   Hemoglobin A1c   Comprehensive metabolic panel     Musculoskeletal and Integument   Degenerative joint disease involving multiple joints    TKA scheduled for January      Other Visit Diagnoses     Need for immunization against influenza       Relevant Orders   Flu Vaccine QUAD High Dose(Fluad) (Completed)        Partially dictated using Editor, commissioning. Any errors are unintentional.  Halina Maidens, MD [facility]  02/16/2022

## 2022-02-17 LAB — COMPREHENSIVE METABOLIC PANEL
ALT: 12 IU/L (ref 0–32)
AST: 17 IU/L (ref 0–40)
Albumin/Globulin Ratio: 2.1 (ref 1.2–2.2)
Albumin: 4.7 g/dL (ref 3.9–4.9)
Alkaline Phosphatase: 76 IU/L (ref 44–121)
BUN/Creatinine Ratio: 23 (ref 12–28)
BUN: 15 mg/dL (ref 8–27)
Bilirubin Total: 0.2 mg/dL (ref 0.0–1.2)
CO2: 26 mmol/L (ref 20–29)
Calcium: 10.5 mg/dL — ABNORMAL HIGH (ref 8.7–10.3)
Chloride: 100 mmol/L (ref 96–106)
Creatinine, Ser: 0.65 mg/dL (ref 0.57–1.00)
Globulin, Total: 2.2 g/dL (ref 1.5–4.5)
Glucose: 94 mg/dL (ref 70–99)
Potassium: 4.6 mmol/L (ref 3.5–5.2)
Sodium: 141 mmol/L (ref 134–144)
Total Protein: 6.9 g/dL (ref 6.0–8.5)
eGFR: 97 mL/min/{1.73_m2} (ref >59)

## 2022-02-17 LAB — LIPID PANEL
Chol/HDL Ratio: 2.6 ratio (ref 0.0–4.4)
Cholesterol, Total: 171 mg/dL (ref 100–199)
HDL: 65 mg/dL (ref >39)
LDL Chol Calc (NIH): 87 mg/dL (ref 0–99)
Triglycerides: 109 mg/dL (ref 0–149)
VLDL Cholesterol Cal: 19 mg/dL (ref 5–40)

## 2022-02-17 LAB — HEMOGLOBIN A1C
Est. average glucose Bld gHb Est-mCnc: 180 mg/dL
Hgb A1c MFr Bld: 7.9 % — ABNORMAL HIGH (ref 4.8–5.6)

## 2022-02-21 ENCOUNTER — Other Ambulatory Visit: Payer: Self-pay | Admitting: Internal Medicine

## 2022-02-21 DIAGNOSIS — R1310 Dysphagia, unspecified: Secondary | ICD-10-CM

## 2022-02-27 ENCOUNTER — Ambulatory Visit: Admitting: Internal Medicine

## 2022-03-07 DIAGNOSIS — M25662 Stiffness of left knee, not elsewhere classified: Secondary | ICD-10-CM | POA: Diagnosis not present

## 2022-03-07 DIAGNOSIS — M25562 Pain in left knee: Secondary | ICD-10-CM | POA: Diagnosis not present

## 2022-03-20 ENCOUNTER — Encounter: Payer: Self-pay | Admitting: Internal Medicine

## 2022-03-20 ENCOUNTER — Ambulatory Visit (INDEPENDENT_AMBULATORY_CARE_PROVIDER_SITE_OTHER): Payer: Medicare HMO | Admitting: Internal Medicine

## 2022-03-20 VITALS — BP 102/64 | HR 72 | Ht 65.0 in | Wt 130.0 lb

## 2022-03-20 DIAGNOSIS — G114 Hereditary spastic paraplegia: Secondary | ICD-10-CM

## 2022-03-20 DIAGNOSIS — E118 Type 2 diabetes mellitus with unspecified complications: Secondary | ICD-10-CM | POA: Diagnosis not present

## 2022-03-20 DIAGNOSIS — E785 Hyperlipidemia, unspecified: Secondary | ICD-10-CM

## 2022-03-20 DIAGNOSIS — J439 Emphysema, unspecified: Secondary | ICD-10-CM

## 2022-03-20 DIAGNOSIS — E1169 Type 2 diabetes mellitus with other specified complication: Secondary | ICD-10-CM

## 2022-03-20 LAB — POCT GLYCOSYLATED HEMOGLOBIN (HGB A1C): Hemoglobin A1C: 7.6 % — AB (ref 4.0–5.6)

## 2022-03-20 NOTE — Assessment & Plan Note (Addendum)
Clinically stable without s/s of hypoglycemia. Tolerating medications well without side effects or other concerns. Continue metformin and Januvia without changes at this time. Last A1C 7.9.  Today A1C 7.6 much improved If surgeon declines surgery, will add glimepiride 2 mg and recheck in 6 weeks.

## 2022-03-20 NOTE — Assessment & Plan Note (Signed)
No change in status other than worsening knee pain Using Rolator; no recent falls Planning a family cruise in June

## 2022-03-20 NOTE — Progress Notes (Signed)
Date:  03/20/2022   Name:  Hannah [name]   DOB:  1956-02-07   MRN:  [id number]   Chief Complaint: Diabetes  Diabetes She presents for her follow-up diabetic visit. She has type 2 diabetes mellitus. Her disease course has been stable. Pertinent negatives for hypoglycemia include no headaches or tremors. Pertinent negatives for diabetes include no chest pain, no fatigue, no polydipsia and no polyuria. Current diabetic treatment includes oral agent (dual therapy). She is compliant with treatment all of the time. An ACE inhibitor/angiotensin II receptor blocker is not being taken. Eye exam is not current.  Knee Pain  The pain is present in the left knee and right knee. The quality of the pain is described as aching, burning and shooting. The pain is moderate. The pain has been Worsening since onset. Pertinent negatives include no numbness. Treatments tried: needs surgery.  She was scheduled to have knee surgery today but it was cancelled due to A1C being > 7.5.  She has changed her diet since then -cutting bread, taking her medications everyday.  Lab Results  Component Value Date   NA 141 02/16/2022   K 4.6 02/16/2022   CO2 26 02/16/2022   GLUCOSE 94 02/16/2022   BUN 15 02/16/2022   CREATININE 0.65 02/16/2022   CALCIUM 10.5 (H) 02/16/2022   EGFR 97 02/16/2022   GFRNONAA 98 11/25/2019   Lab Results  Component Value Date   CHOL 171 02/16/2022   HDL 65 02/16/2022   LDLCALC 87 02/16/2022   TRIG 109 02/16/2022   CHOLHDL 2.6 02/16/2022   Lab Results  Component Value Date   TSH 1.540 10/24/2021   Lab Results  Component Value Date   HGBA1C 7.6 (A) 03/20/2022   Lab Results  Component Value Date   WBC 12.0 (H) 06/23/2021   HGB 14.8 06/23/2021   HCT 42.4 06/23/2021   MCV 93 06/23/2021   PLT 224 06/23/2021   Lab Results  Component Value Date   ALT 12 02/16/2022   AST 17 02/16/2022   ALKPHOS 76 02/16/2022   BILITOT 0.2 02/16/2022   No results found for: "25OHVITD2",  "25OHVITD3", "VD25OH"   Review of Systems  Constitutional:  Negative for appetite change, fatigue, fever and unexpected weight change.  HENT:  Negative for tinnitus and trouble swallowing.   Eyes:  Negative for visual disturbance.  Respiratory:  Negative for cough, chest tightness and shortness of breath.   Cardiovascular:  Negative for chest pain, palpitations and leg swelling.  Gastrointestinal:  Negative for abdominal pain.  Endocrine: Negative for polydipsia and polyuria.  Genitourinary:  Negative for dysuria and hematuria.  Musculoskeletal:  Positive for arthralgias, gait problem and myalgias.  Neurological:  Negative for tremors, numbness and headaches.  Psychiatric/Behavioral:  Negative for dysphoric mood and sleep disturbance.     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/2014   Tobacco use disorder, mild, 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   Degenerative joint disease involving multiple  joints 09/28/2013    Allergies  Allergen Reactions   Levofloxacin Nausea And Vomiting    Past Surgical History:  Procedure Laterality Date   COLONOSCOPY WITH PROPOFOL N/A 03/05/2016   Procedure: COLONOSCOPY WITH PROPOFOL;  Surgeon: [name], MD;  Location: [place];  Service: Endoscopy;  Laterality: N/A;   COLONOSCOPY WITH PROPOFOL N/A 09/18/2021   Procedure: COLONOSCOPY WITH PROPOFOL;   Surgeon: [name], MD;  Location: [place];  Service: Endoscopy;  Laterality: N/A;   ESOPHAGEAL DILATION N/A 12/29/2021   Procedure: ESOPHAGEAL DILATION;  Surgeon: [name], MD;  Location: [place];  Service: Endoscopy;  Laterality: N/A;   ESOPHAGOGASTRODUODENOSCOPY (EGD) WITH PROPOFOL N/A 12/29/2021   Procedure: ESOPHAGOGASTRODUODENOSCOPY (EGD) WITH PROPOFOL;  Surgeon: [name], MD;  Location: [place];  Service: Endoscopy;  Laterality: N/A;  Diabetic   HIP FRACTURE SURGERY Right 03/2013   INNER EAR SURGERY Bilateral    REPLACEMENT TOTAL KNEE Right 02/2015   SHOULDER SURGERY Bilateral    rotator cuff    Social History   Tobacco Use   Smoking status: Former    Packs/day: 0.50    Years: 25.00    Total pack years: 12.50    Types: Cigarettes    Quit date: 03/05/2016    Years since quitting: 6.0   Smokeless tobacco: Never  Vaping Use   Vaping Use: Never used  Substance Use Topics   Alcohol use: No   Drug use: No     Medication list has been reviewed and updated.  Current Meds  Medication Sig   albuterol (VENTOLIN HFA) 108 (90 Base) MCG/ACT inhaler INHALE 2 PUFFS BY MOUTH EVERY 6 HOURS AS NEEDED FOR WHEEZING OR SHORTNESS OF BREATH   alendronate (FOSAMAX) 70 MG tablet Take 1 tablet (70 mg total) by mouth every 7 (seven) days. Take with a full glass of water on an empty stomach - do not eat or drink or recline for 30 minutes after dosing   atorvastatin (LIPITOR) 20 MG tablet TAKE 1 TABLET BY MOUTH DAILY   Blood Glucose Monitoring Suppl (ONETOUCH VERIO) w/Device KIT 1 each by Does not apply route 2 (two) times daily. Test BS twice daily.   fluticasone (FLONASE) 50 MCG/ACT nasal spray SHAKE LIQUID AND USE 2 SPRAYS IN EACH NOSTRIL DAILY   fluticasone-salmeterol (ADVAIR DISKUS) 250-50 MCG/ACT AEPB INHALE 1 PUFF INTO THE LUNGS TWICE DAILY   gabapentin (NEURONTIN) 600 MG tablet Take 1 tablet (600 mg total) by mouth 3 (three) times daily. (Patient  taking differently: Take 600 mg by mouth in the morning, at noon, in the evening, and at bedtime. Patient takes 2 qam and 2 qpm)   JANUVIA 100 MG tablet TAKE 1 TABLET(100 MG) BY MOUTH DAILY   Lancets (ONETOUCH DELICA PLUS [id number]) MISC Use up to 4 times daily to test blood sugar as needed   metFORMIN (GLUCOPHAGE-XR) 500 MG 24 hr tablet Take 2 tablets (1,000 mg total) by mouth 2 (two) times daily with a meal.   omeprazole (PRILOSEC) 20 MG capsule TAKE 1 CAPSULE(20 MG) BY MOUTH TWICE DAILY BEFORE A MEAL   ONETOUCH ULTRA test strip USE TO TEST BLOOD SUGAR TWICE DAILY   oxyCODONE-acetaminophen (PERCOCET) 10-325 MG tablet Take 1 tablet by mouth every 4 (four) hours as needed.   pramipexole (MIRAPEX) 0.125 MG tablet Mirapex 0.125 mg tablet  1 to 2 in pm for RLS   tiZANidine (ZANAFLEX) 4 MG tablet Take 4 mg by mouth every 6 (six) hours as needed.  03/20/2022    8:20 AM 02/16/2022   10:23 AM 10/24/2021    9:28 AM 06/23/2021    3:02 PM  GAD 7 : Generalized Anxiety Score  Nervous, Anxious, on Edge '1 1 3 '$ 0  Control/stop worrying 0 1 3 0  Worry too much - different things '1 2 3 '$ 0  Trouble relaxing 0 2 3 0  Restless '1 2 2 '$ 0  Easily annoyed or irritable '1 3 2 '$ 0  Afraid - awful might happen 0 3 3 0  Total GAD 7 Score '4 14 19 '$ 0  Anxiety Difficulty Not difficult at all Extremely difficult Very difficult        03/20/2022    8:20 AM 02/16/2022   10:23 AM 10/24/2021    9:27 AM  Depression screen PHQ 2/9  Decreased Interest 0 2 3  Down, Depressed, Hopeless 0 1 3  PHQ - 2 Score 0 3 6  Altered sleeping '2 1 1  '$ Tired, decreased energy 0 1 2  Change in appetite 0 1 3  Feeling bad or failure about yourself  0 2 0  Trouble concentrating 0 1 2  Moving slowly or fidgety/restless 0 2 0  Suicidal thoughts 0 0 0  PHQ-9 Score '2 11 14  '$ Difficult doing work/chores Not difficult at all Very difficult Somewhat difficult    BP Readings from Last 3 Encounters:  03/20/22 102/64  02/16/22 (!) 100/58   12/29/21 116/70    Physical Exam Vitals and nursing note reviewed.  Constitutional:      General: She is not in acute distress.    Appearance: She is well-developed.  HENT:     Head: Normocephalic and atraumatic.  Cardiovascular:     Rate and Rhythm: Normal rate and regular rhythm.  Pulmonary:     Effort: Pulmonary effort is normal. No respiratory distress.     Breath sounds: No wheezing or rhonchi.  Musculoskeletal:     Cervical back: Normal range of motion.     Right knee: Bony tenderness present. Decreased range of motion. Tenderness present.     Left knee: Bony tenderness present. Decreased range of motion. Tenderness present.  Lymphadenopathy:     Cervical: No cervical adenopathy.  Skin:    General: Skin is warm and dry.     Findings: No rash.  Neurological:     Mental Status: She is alert and oriented to person, place, and time.  Psychiatric:        Mood and Affect: Mood normal.        Behavior: Behavior normal.     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        Endocrine   Hyperlipidemia associated with type 2 diabetes mellitus (HCC) (Chronic)   Type II diabetes mellitus with complication (HCC) - Primary (Chronic)    Clinically stable without s/s of hypoglycemia. Tolerating medications well without side effects or other concerns. Continue metformin and Januvia without changes  at this time. Last A1C 7.9.  Today A1C 7.6 much improved If surgeon declines surgery, will add glimepiride 2 mg and recheck in 6 weeks.      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 Any errors are unintentional.  Halina Maidens, MD [facility]  03/20/2022

## 2022-03-20 NOTE — Assessment & Plan Note (Signed)
Tolerating statin medication without side effects at this time LDL is  Lab Results  Component Value Date   LDLCALC 87 02/16/2022   with a goal of < 70.

## 2022-03-20 NOTE — Assessment & Plan Note (Signed)
COPD stable without recent URI or other infection Appears stable for surgery.

## 2022-03-21 ENCOUNTER — Other Ambulatory Visit: Payer: Self-pay | Admitting: Internal Medicine

## 2022-03-21 ENCOUNTER — Telehealth: Payer: Self-pay | Admitting: Internal Medicine

## 2022-03-21 DIAGNOSIS — E118 Type 2 diabetes mellitus with unspecified complications: Secondary | ICD-10-CM

## 2022-03-21 MED ORDER — GLIMEPIRIDE 2 MG PO TABS: 2.0000 mg | ORAL_TABLET | Freq: Every day | ORAL | 0 refills | Status: DC

## 2022-03-21 NOTE — Telephone Encounter (Signed)
Pt informed and verbalized understanding. Appt scheduled for 4 weeks.  KP

## 2022-03-21 NOTE — Telephone Encounter (Addendum)
Copied from [place] [id number]. Topic: General - Other >> Mar 21, 2022 10:29 AM Sabas Sous wrote: Reason for CRM: Pt called to report that she was denied for her surgery because her A1C is still too high at 7.6 She has been encouraged to contact her PCP to request a medication that will help her lower her A1C. Wants to have a recheck in a few weeks    Wants a call back to discuss her options, please call back

## 2022-03-28 DIAGNOSIS — Z79899 Other long term (current) drug therapy: Secondary | ICD-10-CM | POA: Diagnosis not present

## 2022-03-28 DIAGNOSIS — M47816 Spondylosis without myelopathy or radiculopathy, lumbar region: Secondary | ICD-10-CM | POA: Diagnosis not present

## 2022-03-28 DIAGNOSIS — M79642 Pain in left hand: Secondary | ICD-10-CM | POA: Diagnosis not present

## 2022-03-28 DIAGNOSIS — M791 Myalgia, unspecified site: Secondary | ICD-10-CM | POA: Diagnosis not present

## 2022-03-28 DIAGNOSIS — G2581 Restless legs syndrome: Secondary | ICD-10-CM | POA: Diagnosis not present

## 2022-03-28 DIAGNOSIS — M5416 Radiculopathy, lumbar region: Secondary | ICD-10-CM | POA: Diagnosis not present

## 2022-03-28 DIAGNOSIS — E1140 Type 2 diabetes mellitus with diabetic neuropathy, unspecified: Secondary | ICD-10-CM | POA: Diagnosis not present

## 2022-03-28 DIAGNOSIS — R202 Paresthesia of skin: Secondary | ICD-10-CM | POA: Diagnosis not present

## 2022-03-28 DIAGNOSIS — Z96659 Presence of unspecified artificial knee joint: Secondary | ICD-10-CM | POA: Diagnosis not present

## 2022-03-28 DIAGNOSIS — M25561 Pain in right knee: Secondary | ICD-10-CM | POA: Diagnosis not present

## 2022-03-28 DIAGNOSIS — M47812 Spondylosis without myelopathy or radiculopathy, cervical region: Secondary | ICD-10-CM | POA: Diagnosis not present

## 2022-03-28 DIAGNOSIS — G894 Chronic pain syndrome: Secondary | ICD-10-CM | POA: Diagnosis not present

## 2022-04-18 ENCOUNTER — Ambulatory Visit (INDEPENDENT_AMBULATORY_CARE_PROVIDER_SITE_OTHER): Payer: Medicare HMO | Admitting: Internal Medicine

## 2022-04-18 ENCOUNTER — Encounter: Payer: Self-pay | Admitting: Internal Medicine

## 2022-04-18 VITALS — BP 110/62 | HR 80 | Ht 65.0 in | Wt 130.0 lb

## 2022-04-18 DIAGNOSIS — E118 Type 2 diabetes mellitus with unspecified complications: Secondary | ICD-10-CM

## 2022-04-18 DIAGNOSIS — M159 Polyosteoarthritis, unspecified: Secondary | ICD-10-CM

## 2022-04-18 LAB — POCT GLYCOSYLATED HEMOGLOBIN (HGB A1C): Hemoglobin A1C: 6.5 % — AB (ref 4.0–5.6)

## 2022-04-18 NOTE — Progress Notes (Signed)
Date:  04/18/2022   Name:  Hannah [name]   DOB:  07-Nov-1955   MRN:  [id number]   Chief Complaint: Diabetes  Diabetes She presents for her follow-up diabetic visit. She has type 2 diabetes mellitus. Her disease course has been improving. Hypoglycemia symptoms include sweats. Pertinent negatives for hypoglycemia include no headaches or tremors. Pertinent negatives for diabetes include no chest pain, no fatigue, no polydipsia and no polyuria. There are no hypoglycemic complications. Current diabetic treatment includes oral agent (triple therapy) (on metormin, januvia and Amaryl added last visit). Her weight is stable. Her home blood glucose trend is decreasing steadily.  Surgery was denied since A1C was over 7.5.  She has done well with medication addition.  She has had some low BS - lowest was 68 and she had some blurred vision and sweats.  Lab Results  Component Value Date   NA 141 02/16/2022   K 4.6 02/16/2022   CO2 26 02/16/2022   GLUCOSE 94 02/16/2022   BUN 15 02/16/2022   CREATININE 0.65 02/16/2022   CALCIUM 10.5 (H) 02/16/2022   EGFR 97 02/16/2022   GFRNONAA 98 11/25/2019   Lab Results  Component Value Date   CHOL 171 02/16/2022   HDL 65 02/16/2022   LDLCALC 87 02/16/2022   TRIG 109 02/16/2022   CHOLHDL 2.6 02/16/2022   Lab Results  Component Value Date   TSH 1.540 10/24/2021   Lab Results  Component Value Date   HGBA1C 6.5 (A) 04/18/2022   Lab Results  Component Value Date   WBC 12.0 (H) 06/23/2021   HGB 14.8 06/23/2021   HCT 42.4 06/23/2021   MCV 93 06/23/2021   PLT 224 06/23/2021   Lab Results  Component Value Date   ALT 12 02/16/2022   AST 17 02/16/2022   ALKPHOS 76 02/16/2022   BILITOT 0.2 02/16/2022   No results found for: "25OHVITD2", "25OHVITD3", "VD25OH"   Review of Systems  Constitutional:  Negative for appetite change, fatigue, fever and unexpected weight change.  HENT:  Negative for tinnitus and trouble swallowing.   Eyes:  Negative for  visual disturbance.  Respiratory:  Negative for cough, chest tightness and shortness of breath.   Cardiovascular:  Negative for chest pain, palpitations and leg swelling.  Gastrointestinal:  Negative for abdominal pain.  Endocrine: Negative for polydipsia and polyuria.  Genitourinary:  Negative for dysuria and hematuria.  Musculoskeletal:  Negative for arthralgias.  Neurological:  Negative for tremors, numbness and headaches.  Psychiatric/Behavioral:  Negative for dysphoric mood.     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   Type II diabetes mellitus with complication ([place]) [id number]   COPD (chronic obstructive pulmonary disease) with emphysema ([place]) 11/11/2014   Tobacco use disorder, mild, 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   Degenerative joint disease involving multiple joints 09/28/2013    Allergies  Allergen Reactions   Levofloxacin Nausea And Vomiting    Past Surgical History:  Procedure Laterality Date   COLONOSCOPY WITH PROPOFOL N/A 03/05/2016   Procedure: COLONOSCOPY WITH PROPOFOL;  Surgeon: [name], MD;  [facility];  Service: Endoscopy;  Laterality: N/A;   COLONOSCOPY WITH PROPOFOL N/A 09/18/2021   Procedure: COLONOSCOPY WITH PROPOFOL;  Surgeon: [name], MD;  Location: [place];  Service: Endoscopy;  Laterality: N/A;   ESOPHAGEAL DILATION N/A 12/29/2021   Procedure: ESOPHAGEAL DILATION;  Surgeon: [name], MD;  Location: [place];  Service: Endoscopy;  Laterality: N/A;    ESOPHAGOGASTRODUODENOSCOPY (EGD) WITH PROPOFOL N/A 12/29/2021   Procedure: ESOPHAGOGASTRODUODENOSCOPY (EGD) WITH PROPOFOL;  Surgeon: [name], MD;  Location: [place];  Service: Endoscopy;  Laterality: N/A;  Diabetic   HIP FRACTURE SURGERY Right 03/2013   INNER EAR SURGERY Bilateral    REPLACEMENT TOTAL KNEE Right 02/2015   SHOULDER SURGERY Bilateral    rotator cuff    Social History   Tobacco Use   Smoking status: Former    Packs/day: 0.50    Years: 25.00    Total pack years: 12.50    Types: Cigarettes    Quit date: 03/05/2016    Years since quitting: 6.1   Smokeless tobacco: Never  Vaping Use   Vaping Use: Never used  Substance Use Topics   Alcohol use: No   Drug use: No     Medication list has been reviewed and updated.  Current Meds  Medication Sig   albuterol (VENTOLIN HFA) 108 (90 Base) MCG/ACT inhaler INHALE 2 PUFFS BY MOUTH EVERY 6 HOURS AS NEEDED FOR WHEEZING OR SHORTNESS OF BREATH   alendronate (FOSAMAX) 70 MG tablet Take 1 tablet (70 mg total) by mouth every 7 (seven) days. Take with a full glass of water on an empty stomach - do not eat or drink or recline for 30 minutes after dosing   atorvastatin (LIPITOR) 20 MG tablet TAKE 1 TABLET BY MOUTH DAILY   Blood Glucose Monitoring Suppl (ONETOUCH VERIO) w/Device KIT 1 each by Does not apply route 2 (two) times daily. Test BS twice daily.   fluticasone (FLONASE) 50 MCG/ACT nasal spray SHAKE LIQUID AND USE 2 SPRAYS IN EACH NOSTRIL DAILY   fluticasone-salmeterol (ADVAIR DISKUS) 250-50 MCG/ACT AEPB INHALE 1 PUFF INTO THE LUNGS TWICE DAILY   gabapentin (NEURONTIN) 600 MG tablet Take 1 tablet (600 mg total) by mouth 3 (three) times daily. (Patient taking differently: Take 600 mg by mouth in the morning, at noon, in the evening, and at bedtime. Patient takes 2 qam and 2 qpm)   glimepiride (AMARYL) 2 MG tablet Take 1 tablet (2 mg total) by mouth daily before breakfast.   JANUVIA 100 MG tablet TAKE 1 TABLET(100  MG) BY MOUTH DAILY   Lancets (ONETOUCH DELICA PLUS [id number]) MISC Use up to 4 times daily to test blood sugar as needed   metFORMIN (GLUCOPHAGE-XR) 500 MG 24 hr tablet Take 2 tablets (1,000 mg total) by mouth 2 (two) times daily with a meal.   omeprazole (PRILOSEC) 20 MG capsule TAKE 1 CAPSULE(20 MG) BY MOUTH TWICE DAILY BEFORE A MEAL   ONETOUCH ULTRA test strip USE TO TEST BLOOD SUGAR TWICE DAILY   oxyCODONE-acetaminophen (PERCOCET) 10-325 MG tablet Take 1 tablet by mouth every 4 (four) hours as needed.   pramipexole (MIRAPEX) 0.125 MG tablet Mirapex 0.125 mg tablet  1 to 2 in pm for RLS   tiZANidine (ZANAFLEX) 4 MG tablet Take 4 mg by mouth every 6 (six) hours as needed.        04/18/2022    3:26 PM 03/20/2022    8:20 AM 02/16/2022   10:23 AM 10/24/2021    9:28  AM  GAD 7 : Generalized Anxiety Score  Nervous, Anxious, on Edge 0 '1 1 3  '$ Control/stop worrying 0 0 1 3  Worry too much - different things 0 '1 2 3  '$ Trouble relaxing 0 0 2 3  Restless 0 '1 2 2  '$ Easily annoyed or irritable 0 '1 3 2  '$ Afraid - awful might happen 0 0 3 3  Total GAD 7 Score 0 '4 14 19  '$ Anxiety Difficulty Not difficult at all Not difficult at all Extremely difficult Very difficult       04/18/2022    3:26 PM 03/20/2022    8:20 AM 02/16/2022   10:23 AM  Depression screen PHQ 2/9  Decreased Interest 0 0 2  Down, Depressed, Hopeless 0 0 1  PHQ - 2 Score 0 0 3  Altered sleeping 0 2 1  Tired, decreased energy 0 0 1  Change in appetite 0 0 1  Feeling bad or failure about yourself  0 0 2  Trouble concentrating 0 0 1  Moving slowly or fidgety/restless 0 0 2  Suicidal thoughts 0 0 0  PHQ-9 Score 0 2 11  Difficult doing work/chores Not difficult at all Not difficult at all Very difficult    BP Readings from Last 3 Encounters:  04/18/22 110/62  03/20/22 102/64  02/16/22 (!) 100/58    Physical Exam Constitutional:      Appearance: Normal appearance.  Cardiovascular:     Rate and Rhythm: Normal rate and  regular rhythm.  Pulmonary:     Effort: Pulmonary effort is normal.     Breath sounds: No decreased breath sounds.  Neurological:     Mental Status: She is alert.     Motor: Weakness and abnormal muscle tone present.     Gait: Gait abnormal.     Wt Readings from Last 3 Encounters:  04/18/22 130 lb (59 kg)  03/20/22 130 lb (59 kg)  02/16/22 135 lb 6.4 oz (61.4 kg)    BP 110/62   Pulse 80   Ht '5\' 5"'$  (1.651 m)   Wt 130 lb (59 kg)   SpO2 94%   BMI 21.63 kg/m   Assessment and Plan: Problem List Items Addressed This Visit       Endocrine   Type II diabetes mellitus with complication (HCC) - Primary (Chronic)    Amaryl added last visit to get A1C below 7.5 as needed for surgery A1C today is 6.5 Will reduce Amaryl to 1 mg qam due to some low blood sugars      Relevant Orders   POCT glycosylated hemoglobin (Hb A1C) (Completed)     Musculoskeletal and Integument   Degenerative joint disease involving multiple joints    Desperately needs surgery due to gait disturbance and bone on bone arthritis. A1C has to be < 7.5 A1C today 6.5 Will send note and lab results to Dr. Melvyn Novas        Partially dictated using Dragon software. Any errors are unintentional.  Halina Maidens, MD [facility]  04/18/2022

## 2022-04-18 NOTE — Assessment & Plan Note (Addendum)
Amaryl added last visit to get A1C below 7.5 as needed for surgery A1C today is 6.5 Will reduce Amaryl to 1 mg qam due to some low blood sugars

## 2022-04-18 NOTE — Assessment & Plan Note (Addendum)
Desperately needs surgery due to gait disturbance and bone on bone arthritis. A1C has to be < 7.5 A1C today 6.5 Will send note and lab results to Dr. Melvyn Novas

## 2022-04-23 ENCOUNTER — Other Ambulatory Visit: Payer: Self-pay | Admitting: Internal Medicine

## 2022-04-23 DIAGNOSIS — R1310 Dysphagia, unspecified: Secondary | ICD-10-CM

## 2022-04-23 DIAGNOSIS — E785 Hyperlipidemia, unspecified: Secondary | ICD-10-CM

## 2022-04-23 DIAGNOSIS — E118 Type 2 diabetes mellitus with unspecified complications: Secondary | ICD-10-CM

## 2022-05-22 ENCOUNTER — Other Ambulatory Visit: Payer: Self-pay | Admitting: Internal Medicine

## 2022-05-22 DIAGNOSIS — Z01812 Encounter for preprocedural laboratory examination: Secondary | ICD-10-CM | POA: Diagnosis not present

## 2022-05-22 DIAGNOSIS — M79642 Pain in left hand: Secondary | ICD-10-CM | POA: Diagnosis not present

## 2022-05-22 DIAGNOSIS — G2581 Restless legs syndrome: Secondary | ICD-10-CM | POA: Diagnosis not present

## 2022-05-22 DIAGNOSIS — E1140 Type 2 diabetes mellitus with diabetic neuropathy, unspecified: Secondary | ICD-10-CM | POA: Diagnosis not present

## 2022-05-22 DIAGNOSIS — Z79899 Other long term (current) drug therapy: Secondary | ICD-10-CM | POA: Diagnosis not present

## 2022-05-22 DIAGNOSIS — G894 Chronic pain syndrome: Secondary | ICD-10-CM | POA: Diagnosis not present

## 2022-05-22 DIAGNOSIS — J3089 Other allergic rhinitis: Secondary | ICD-10-CM

## 2022-05-22 DIAGNOSIS — M47812 Spondylosis without myelopathy or radiculopathy, cervical region: Secondary | ICD-10-CM | POA: Diagnosis not present

## 2022-05-22 DIAGNOSIS — M791 Myalgia, unspecified site: Secondary | ICD-10-CM | POA: Diagnosis not present

## 2022-05-22 DIAGNOSIS — M47816 Spondylosis without myelopathy or radiculopathy, lumbar region: Secondary | ICD-10-CM | POA: Diagnosis not present

## 2022-05-22 DIAGNOSIS — M1712 Unilateral primary osteoarthritis, left knee: Secondary | ICD-10-CM | POA: Diagnosis not present

## 2022-05-22 DIAGNOSIS — Z96651 Presence of right artificial knee joint: Secondary | ICD-10-CM | POA: Diagnosis not present

## 2022-05-22 NOTE — Telephone Encounter (Signed)
Requested medication (s) are due for refill today: expired medication  Requested medication (s) are on the active medication list: yes  Last refill:  05/28/20 #16g 6 refills  Future visit scheduled: yes in 1 month  Notes to clinic:   expired medication. Do you want to renew Rx?     Requested Prescriptions  Pending Prescriptions Disp Refills   fluticasone (FLONASE) 50 MCG/ACT nasal spray [Pharmacy Med Name: FLUTICASONE 50MCG NASAL SP (120) RX] 16 g 6    Sig: SHAKE LIQUID AND USE 2 SPRAYS IN EACH NOSTRIL DAILY     Ear, Nose, and Throat: Nasal Preparations - Corticosteroids Passed - 05/22/2022  2:35 PM      Passed - Valid encounter within last 12 months    Recent Outpatient Visits           1 month ago Type II diabetes mellitus with complication [facility])   [place] [facility] at [facility], Jesse Sans, MD   2 months ago Type II diabetes mellitus with complication [facility])   [place] [facility] at [facility], Jesse Sans, MD   3 months ago Type II diabetes mellitus with complication [facility])   [place] [facility] at [facility], Jesse Sans, MD   7 months ago Type II diabetes mellitus with complication [facility])   [place] at [facility], Jesse Sans, MD   11 months ago Type II diabetes mellitus with complication [facility])   [place] at [facility], Jesse Sans, MD       Future Appointments             In 1 month Army Melia, Jesse Sans, MD [place] at [facility], [facility]   In 2 months Army Melia, Jesse Sans, MD [place] at [facility], [facility]

## 2022-05-23 ENCOUNTER — Other Ambulatory Visit: Payer: Self-pay | Admitting: Internal Medicine

## 2022-05-23 DIAGNOSIS — J3089 Other allergic rhinitis: Secondary | ICD-10-CM

## 2022-05-23 NOTE — Telephone Encounter (Signed)
Requested Prescriptions  Pending Prescriptions Disp Refills   fluticasone (FLONASE) 50 MCG/ACT nasal spray [Pharmacy Med Name: FLUTICASONE 50MCG NASAL SP (120) RX] 48 g     Sig: SHAKE LIQUID AND USE 2 SPRAYS IN EACH NOSTRIL DAILY     Ear, Nose, and Throat: Nasal Preparations - Corticosteroids Passed - 05/23/2022  8:06 AM      Passed - Valid encounter within last 12 months    Recent Outpatient Visits           1 month ago Type II diabetes mellitus with complication [facility])   [place] [facility] at [facility], Jesse Sans, MD   2 months ago Type II diabetes mellitus with complication [facility])   [place] [facility] at [facility], Jesse Sans, MD   3 months ago Type II diabetes mellitus with complication [facility])    [facility] at [facility], Jesse Sans, MD   7 months ago Type II diabetes mellitus with complication [facility])   [place] at [facility], Jesse Sans, MD   11 months ago Type II diabetes mellitus with complication [facility])   [place] at [facility], Jesse Sans, MD       Future Appointments             In 1 month Army Melia, Jesse Sans, MD [place] at [facility], [facility]   In 2 months Army Melia, Jesse Sans, MD [place] at [facility], [facility]

## 2022-05-25 DIAGNOSIS — Z0100 Encounter for examination of eyes and vision without abnormal findings: Secondary | ICD-10-CM | POA: Diagnosis not present

## 2022-06-05 DIAGNOSIS — E785 Hyperlipidemia, unspecified: Secondary | ICD-10-CM | POA: Diagnosis not present

## 2022-06-05 DIAGNOSIS — G2581 Restless legs syndrome: Secondary | ICD-10-CM | POA: Diagnosis not present

## 2022-06-05 DIAGNOSIS — K219 Gastro-esophageal reflux disease without esophagitis: Secondary | ICD-10-CM | POA: Diagnosis not present

## 2022-06-05 DIAGNOSIS — J439 Emphysema, unspecified: Secondary | ICD-10-CM | POA: Diagnosis not present

## 2022-06-05 DIAGNOSIS — Z7984 Long term (current) use of oral hypoglycemic drugs: Secondary | ICD-10-CM | POA: Diagnosis not present

## 2022-06-05 DIAGNOSIS — G8918 Other acute postprocedural pain: Secondary | ICD-10-CM | POA: Diagnosis not present

## 2022-06-05 DIAGNOSIS — E119 Type 2 diabetes mellitus without complications: Secondary | ICD-10-CM | POA: Diagnosis not present

## 2022-06-05 DIAGNOSIS — Z96651 Presence of right artificial knee joint: Secondary | ICD-10-CM | POA: Diagnosis not present

## 2022-06-05 DIAGNOSIS — M85862 Other specified disorders of bone density and structure, left lower leg: Secondary | ICD-10-CM | POA: Diagnosis not present

## 2022-06-05 DIAGNOSIS — Z79899 Other long term (current) drug therapy: Secondary | ICD-10-CM | POA: Diagnosis not present

## 2022-06-05 DIAGNOSIS — Z96649 Presence of unspecified artificial hip joint: Secondary | ICD-10-CM | POA: Diagnosis not present

## 2022-06-05 DIAGNOSIS — G114 Hereditary spastic paraplegia: Secondary | ICD-10-CM | POA: Diagnosis not present

## 2022-06-05 DIAGNOSIS — I10 Essential (primary) hypertension: Secondary | ICD-10-CM | POA: Diagnosis not present

## 2022-06-05 DIAGNOSIS — M1712 Unilateral primary osteoarthritis, left knee: Secondary | ICD-10-CM | POA: Diagnosis not present

## 2022-06-05 DIAGNOSIS — Z8261 Family history of arthritis: Secondary | ICD-10-CM | POA: Diagnosis not present

## 2022-06-05 HISTORY — PX: TOTAL KNEE ARTHROPLASTY: SHX125

## 2022-06-06 DIAGNOSIS — G2581 Restless legs syndrome: Secondary | ICD-10-CM | POA: Diagnosis not present

## 2022-06-06 DIAGNOSIS — K219 Gastro-esophageal reflux disease without esophagitis: Secondary | ICD-10-CM | POA: Diagnosis not present

## 2022-06-06 DIAGNOSIS — G114 Hereditary spastic paraplegia: Secondary | ICD-10-CM | POA: Diagnosis not present

## 2022-06-06 DIAGNOSIS — Z96649 Presence of unspecified artificial hip joint: Secondary | ICD-10-CM | POA: Diagnosis not present

## 2022-06-06 DIAGNOSIS — E119 Type 2 diabetes mellitus without complications: Secondary | ICD-10-CM | POA: Diagnosis not present

## 2022-06-06 DIAGNOSIS — Z8261 Family history of arthritis: Secondary | ICD-10-CM | POA: Diagnosis not present

## 2022-06-06 DIAGNOSIS — J439 Emphysema, unspecified: Secondary | ICD-10-CM | POA: Diagnosis not present

## 2022-06-06 DIAGNOSIS — M85862 Other specified disorders of bone density and structure, left lower leg: Secondary | ICD-10-CM | POA: Diagnosis not present

## 2022-06-06 DIAGNOSIS — I10 Essential (primary) hypertension: Secondary | ICD-10-CM | POA: Diagnosis not present

## 2022-06-06 DIAGNOSIS — Z7984 Long term (current) use of oral hypoglycemic drugs: Secondary | ICD-10-CM | POA: Diagnosis not present

## 2022-06-06 DIAGNOSIS — Z79899 Other long term (current) drug therapy: Secondary | ICD-10-CM | POA: Diagnosis not present

## 2022-06-06 DIAGNOSIS — Z96651 Presence of right artificial knee joint: Secondary | ICD-10-CM | POA: Diagnosis not present

## 2022-06-06 DIAGNOSIS — M1712 Unilateral primary osteoarthritis, left knee: Secondary | ICD-10-CM | POA: Diagnosis not present

## 2022-06-07 DIAGNOSIS — G2581 Restless legs syndrome: Secondary | ICD-10-CM | POA: Diagnosis not present

## 2022-06-07 DIAGNOSIS — Z471 Aftercare following joint replacement surgery: Secondary | ICD-10-CM | POA: Diagnosis not present

## 2022-06-07 DIAGNOSIS — I10 Essential (primary) hypertension: Secondary | ICD-10-CM | POA: Diagnosis not present

## 2022-06-07 DIAGNOSIS — M1712 Unilateral primary osteoarthritis, left knee: Secondary | ICD-10-CM | POA: Diagnosis not present

## 2022-06-07 DIAGNOSIS — Z9181 History of falling: Secondary | ICD-10-CM | POA: Diagnosis not present

## 2022-06-07 DIAGNOSIS — R011 Cardiac murmur, unspecified: Secondary | ICD-10-CM | POA: Diagnosis not present

## 2022-06-07 DIAGNOSIS — E119 Type 2 diabetes mellitus without complications: Secondary | ICD-10-CM | POA: Diagnosis not present

## 2022-06-07 DIAGNOSIS — Z7982 Long term (current) use of aspirin: Secondary | ICD-10-CM | POA: Diagnosis not present

## 2022-06-07 DIAGNOSIS — Z7984 Long term (current) use of oral hypoglycemic drugs: Secondary | ICD-10-CM | POA: Diagnosis not present

## 2022-06-07 DIAGNOSIS — E785 Hyperlipidemia, unspecified: Secondary | ICD-10-CM | POA: Diagnosis not present

## 2022-06-07 DIAGNOSIS — Z96698 Presence of other orthopedic joint implants: Secondary | ICD-10-CM | POA: Diagnosis not present

## 2022-06-07 DIAGNOSIS — K219 Gastro-esophageal reflux disease without esophagitis: Secondary | ICD-10-CM | POA: Diagnosis not present

## 2022-06-07 DIAGNOSIS — Z96652 Presence of left artificial knee joint: Secondary | ICD-10-CM | POA: Diagnosis not present

## 2022-06-07 DIAGNOSIS — J439 Emphysema, unspecified: Secondary | ICD-10-CM | POA: Diagnosis not present

## 2022-06-07 DIAGNOSIS — Z87891 Personal history of nicotine dependence: Secondary | ICD-10-CM | POA: Diagnosis not present

## 2022-06-07 DIAGNOSIS — M549 Dorsalgia, unspecified: Secondary | ICD-10-CM | POA: Diagnosis not present

## 2022-06-07 DIAGNOSIS — G114 Hereditary spastic paraplegia: Secondary | ICD-10-CM | POA: Diagnosis not present

## 2022-06-07 DIAGNOSIS — M810 Age-related osteoporosis without current pathological fracture: Secondary | ICD-10-CM | POA: Diagnosis not present

## 2022-06-07 DIAGNOSIS — F32A Depression, unspecified: Secondary | ICD-10-CM | POA: Diagnosis not present

## 2022-06-07 DIAGNOSIS — Z96651 Presence of right artificial knee joint: Secondary | ICD-10-CM | POA: Diagnosis not present

## 2022-06-07 DIAGNOSIS — Z7951 Long term (current) use of inhaled steroids: Secondary | ICD-10-CM | POA: Diagnosis not present

## 2022-06-10 DIAGNOSIS — R69 Illness, unspecified: Secondary | ICD-10-CM | POA: Diagnosis not present

## 2022-06-13 DIAGNOSIS — G2581 Restless legs syndrome: Secondary | ICD-10-CM | POA: Diagnosis not present

## 2022-06-13 DIAGNOSIS — I10 Essential (primary) hypertension: Secondary | ICD-10-CM | POA: Diagnosis not present

## 2022-06-13 DIAGNOSIS — Z96652 Presence of left artificial knee joint: Secondary | ICD-10-CM | POA: Diagnosis not present

## 2022-06-13 DIAGNOSIS — E119 Type 2 diabetes mellitus without complications: Secondary | ICD-10-CM | POA: Diagnosis not present

## 2022-06-13 DIAGNOSIS — G114 Hereditary spastic paraplegia: Secondary | ICD-10-CM | POA: Diagnosis not present

## 2022-06-13 DIAGNOSIS — M810 Age-related osteoporosis without current pathological fracture: Secondary | ICD-10-CM | POA: Diagnosis not present

## 2022-06-13 DIAGNOSIS — F32A Depression, unspecified: Secondary | ICD-10-CM | POA: Diagnosis not present

## 2022-06-13 DIAGNOSIS — Z471 Aftercare following joint replacement surgery: Secondary | ICD-10-CM | POA: Diagnosis not present

## 2022-06-13 DIAGNOSIS — J439 Emphysema, unspecified: Secondary | ICD-10-CM | POA: Diagnosis not present

## 2022-06-13 DIAGNOSIS — M1712 Unilateral primary osteoarthritis, left knee: Secondary | ICD-10-CM | POA: Diagnosis not present

## 2022-06-18 DIAGNOSIS — I10 Essential (primary) hypertension: Secondary | ICD-10-CM | POA: Diagnosis not present

## 2022-06-18 DIAGNOSIS — G114 Hereditary spastic paraplegia: Secondary | ICD-10-CM | POA: Diagnosis not present

## 2022-06-18 DIAGNOSIS — F32A Depression, unspecified: Secondary | ICD-10-CM | POA: Diagnosis not present

## 2022-06-18 DIAGNOSIS — Z96652 Presence of left artificial knee joint: Secondary | ICD-10-CM | POA: Diagnosis not present

## 2022-06-18 DIAGNOSIS — J439 Emphysema, unspecified: Secondary | ICD-10-CM | POA: Diagnosis not present

## 2022-06-18 DIAGNOSIS — G2581 Restless legs syndrome: Secondary | ICD-10-CM | POA: Diagnosis not present

## 2022-06-18 DIAGNOSIS — M1712 Unilateral primary osteoarthritis, left knee: Secondary | ICD-10-CM | POA: Diagnosis not present

## 2022-06-18 DIAGNOSIS — E119 Type 2 diabetes mellitus without complications: Secondary | ICD-10-CM | POA: Diagnosis not present

## 2022-06-18 DIAGNOSIS — M810 Age-related osteoporosis without current pathological fracture: Secondary | ICD-10-CM | POA: Diagnosis not present

## 2022-06-18 DIAGNOSIS — Z471 Aftercare following joint replacement surgery: Secondary | ICD-10-CM | POA: Diagnosis not present

## 2022-06-20 DIAGNOSIS — G114 Hereditary spastic paraplegia: Secondary | ICD-10-CM | POA: Diagnosis not present

## 2022-06-20 DIAGNOSIS — I10 Essential (primary) hypertension: Secondary | ICD-10-CM | POA: Diagnosis not present

## 2022-06-20 DIAGNOSIS — F32A Depression, unspecified: Secondary | ICD-10-CM | POA: Diagnosis not present

## 2022-06-20 DIAGNOSIS — E119 Type 2 diabetes mellitus without complications: Secondary | ICD-10-CM | POA: Diagnosis not present

## 2022-06-20 DIAGNOSIS — R2242 Localized swelling, mass and lump, left lower limb: Secondary | ICD-10-CM | POA: Diagnosis not present

## 2022-06-20 DIAGNOSIS — G2581 Restless legs syndrome: Secondary | ICD-10-CM | POA: Diagnosis not present

## 2022-06-20 DIAGNOSIS — I83812 Varicose veins of left lower extremities with pain: Secondary | ICD-10-CM | POA: Diagnosis not present

## 2022-06-20 DIAGNOSIS — K219 Gastro-esophageal reflux disease without esophagitis: Secondary | ICD-10-CM | POA: Diagnosis not present

## 2022-06-20 DIAGNOSIS — M810 Age-related osteoporosis without current pathological fracture: Secondary | ICD-10-CM | POA: Diagnosis not present

## 2022-06-20 DIAGNOSIS — Z4889 Encounter for other specified surgical aftercare: Secondary | ICD-10-CM | POA: Diagnosis not present

## 2022-06-20 DIAGNOSIS — J449 Chronic obstructive pulmonary disease, unspecified: Secondary | ICD-10-CM | POA: Diagnosis not present

## 2022-06-20 DIAGNOSIS — I83892 Varicose veins of left lower extremities with other complications: Secondary | ICD-10-CM | POA: Diagnosis not present

## 2022-06-20 DIAGNOSIS — M791 Myalgia, unspecified site: Secondary | ICD-10-CM | POA: Diagnosis not present

## 2022-06-20 DIAGNOSIS — M1712 Unilateral primary osteoarthritis, left knee: Secondary | ICD-10-CM | POA: Diagnosis not present

## 2022-06-20 DIAGNOSIS — Z471 Aftercare following joint replacement surgery: Secondary | ICD-10-CM | POA: Diagnosis not present

## 2022-06-20 DIAGNOSIS — J439 Emphysema, unspecified: Secondary | ICD-10-CM | POA: Diagnosis not present

## 2022-06-20 DIAGNOSIS — Z96652 Presence of left artificial knee joint: Secondary | ICD-10-CM | POA: Diagnosis not present

## 2022-06-22 DIAGNOSIS — G2581 Restless legs syndrome: Secondary | ICD-10-CM | POA: Diagnosis not present

## 2022-06-22 DIAGNOSIS — J439 Emphysema, unspecified: Secondary | ICD-10-CM | POA: Diagnosis not present

## 2022-06-22 DIAGNOSIS — F32A Depression, unspecified: Secondary | ICD-10-CM | POA: Diagnosis not present

## 2022-06-22 DIAGNOSIS — G114 Hereditary spastic paraplegia: Secondary | ICD-10-CM | POA: Diagnosis not present

## 2022-06-22 DIAGNOSIS — Z471 Aftercare following joint replacement surgery: Secondary | ICD-10-CM | POA: Diagnosis not present

## 2022-06-22 DIAGNOSIS — Z96652 Presence of left artificial knee joint: Secondary | ICD-10-CM | POA: Diagnosis not present

## 2022-06-22 DIAGNOSIS — M1712 Unilateral primary osteoarthritis, left knee: Secondary | ICD-10-CM | POA: Diagnosis not present

## 2022-06-22 DIAGNOSIS — M810 Age-related osteoporosis without current pathological fracture: Secondary | ICD-10-CM | POA: Diagnosis not present

## 2022-06-22 DIAGNOSIS — E119 Type 2 diabetes mellitus without complications: Secondary | ICD-10-CM | POA: Diagnosis not present

## 2022-06-22 DIAGNOSIS — I10 Essential (primary) hypertension: Secondary | ICD-10-CM | POA: Diagnosis not present

## 2022-06-25 DIAGNOSIS — I10 Essential (primary) hypertension: Secondary | ICD-10-CM | POA: Diagnosis not present

## 2022-06-25 DIAGNOSIS — G114 Hereditary spastic paraplegia: Secondary | ICD-10-CM | POA: Diagnosis not present

## 2022-06-25 DIAGNOSIS — M1712 Unilateral primary osteoarthritis, left knee: Secondary | ICD-10-CM | POA: Diagnosis not present

## 2022-06-25 DIAGNOSIS — J439 Emphysema, unspecified: Secondary | ICD-10-CM | POA: Diagnosis not present

## 2022-06-25 DIAGNOSIS — Z96652 Presence of left artificial knee joint: Secondary | ICD-10-CM | POA: Diagnosis not present

## 2022-06-25 DIAGNOSIS — E119 Type 2 diabetes mellitus without complications: Secondary | ICD-10-CM | POA: Diagnosis not present

## 2022-06-25 DIAGNOSIS — G2581 Restless legs syndrome: Secondary | ICD-10-CM | POA: Diagnosis not present

## 2022-06-25 DIAGNOSIS — M810 Age-related osteoporosis without current pathological fracture: Secondary | ICD-10-CM | POA: Diagnosis not present

## 2022-06-25 DIAGNOSIS — Z471 Aftercare following joint replacement surgery: Secondary | ICD-10-CM | POA: Diagnosis not present

## 2022-06-25 DIAGNOSIS — F32A Depression, unspecified: Secondary | ICD-10-CM | POA: Diagnosis not present

## 2022-06-27 ENCOUNTER — Ambulatory Visit (INDEPENDENT_AMBULATORY_CARE_PROVIDER_SITE_OTHER): Payer: Medicare HMO | Admitting: Internal Medicine

## 2022-06-27 ENCOUNTER — Encounter: Payer: Self-pay | Admitting: Internal Medicine

## 2022-06-27 VITALS — BP 110/72 | HR 83 | Ht 65.0 in | Wt 127.0 lb

## 2022-06-27 DIAGNOSIS — Z1231 Encounter for screening mammogram for malignant neoplasm of breast: Secondary | ICD-10-CM

## 2022-06-27 DIAGNOSIS — Z0000 Encounter for general adult medical examination without abnormal findings: Secondary | ICD-10-CM | POA: Diagnosis not present

## 2022-06-27 DIAGNOSIS — Z7984 Long term (current) use of oral hypoglycemic drugs: Secondary | ICD-10-CM

## 2022-06-27 DIAGNOSIS — E118 Type 2 diabetes mellitus with unspecified complications: Secondary | ICD-10-CM | POA: Diagnosis not present

## 2022-06-27 DIAGNOSIS — L03116 Cellulitis of left lower limb: Secondary | ICD-10-CM | POA: Diagnosis not present

## 2022-06-27 DIAGNOSIS — E785 Hyperlipidemia, unspecified: Secondary | ICD-10-CM

## 2022-06-27 DIAGNOSIS — Z4889 Encounter for other specified surgical aftercare: Secondary | ICD-10-CM | POA: Diagnosis not present

## 2022-06-27 DIAGNOSIS — E1169 Type 2 diabetes mellitus with other specified complication: Secondary | ICD-10-CM | POA: Diagnosis not present

## 2022-06-27 MED ORDER — JANUVIA 100 MG PO TABS: ORAL_TABLET | ORAL | 3 refills | Status: DC

## 2022-06-27 NOTE — Patient Instructions (Signed)
Call [facility] to schedule your mammogram at [phone]..  -It was a pleasure to see you today! Please review your visit summary for helpful information. -Lab results are usually available within 1-2 days and we will call once reviewed. -I would encourage you to follow your care via MyChart where you can access lab results, notes, messages, and more. -If you feel that we did a nice job today, please complete your after-visit survey and leave us a Google review! Your CMA today was Chassidy and your provider was Dr Jeptha Hinnenkamp, MD.  

## 2022-06-27 NOTE — Assessment & Plan Note (Addendum)
Blood sugars stable without hypoglycemic symptoms or events. Currently being treated with metformin and amaryl.  Has not had Januvia - pharmacy stated she needed a new Rx Lab Results  Component Value Date   HGBA1C 6.5 (A) 04/18/2022  With a goal of < 8.0. Recommend stopping Amaryl due to some low blood sugars and resuming Januvia - Rx sent.

## 2022-06-27 NOTE — Assessment & Plan Note (Addendum)
Tolerating statin medications.  No side effects noted.  She reports taking her medications regularly. LDL is  Lab Results  Component Value Date   LDLCALC 87 02/16/2022  On atorvastatin 20 mg. Will advise if dose change is indicated.

## 2022-06-27 NOTE — Progress Notes (Signed)
Date:  06/27/2022   Name:  Hannah [name]   DOB:  1955-04-23   MRN:  [id number]   Chief Complaint: Annual Exam Hannah [name] is a 67 y.o. female who presents today for her Complete Annual Exam. She feels fairly well. She reports exercising - not at this time. She reports she is sleeping fairly well. Breast complaints - none.  She is doing fairly well three weeks out from TKA on the left.  She did develop some LLE cellulitis and has just completed 7 days of Ceftin.  She has Ortho follow up this afternoon.  Mammogram: 10/2021 DEXA: 10/2021 - OP Pap smear: discontinued Colonoscopy: 08/2021 repeat 7 yrs  Health Maintenance Due  Topic Date Due   Zoster Vaccines- Shingrix (1 of 2) Never done   COVID-19 Vaccine (3 - Pfizer risk series) 07/08/2019   OPHTHALMOLOGY EXAM  09/22/2021   Diabetic kidney evaluation - Urine ACR  06/24/2022   Medicare Annual Wellness (AWV)  07/25/2022    Immunization History  Administered Date(s) Administered   Fluad Quad(high Dose 65+) 02/16/2022   Influenza, Seasonal, Injecte, Preservative Fre 11/29/2011   Influenza,inj,Quad PF,6+ Mos 11/24/2014, 11/25/2015, 12/24/2016, 12/11/2017, 10/29/2018, 11/25/2019   Influenza,inj,quad, With Preservative 03/08/2017   Influenza-Unspecified 11/24/2014, 11/25/2015, 12/24/2016, 12/11/2017, 10/29/2018, 11/25/2019, 10/18/2020   PFIZER(Purple Top)SARS-COV-2 Vaccination 05/18/2019, 06/10/2019   PNEUMOCOCCAL CONJUGATE-20 09/16/2020   Pneumococcal Conjugate-13 11/24/2014   Pneumococcal Polysaccharide-23 05/20/2012   Tdap 11/25/2019    Diabetes She has type 2 diabetes mellitus. Her disease course has been stable. Pertinent negatives for hypoglycemia include no dizziness, headaches, nervousness/anxiousness or tremors. Pertinent negatives for diabetes include no chest pain, no fatigue, no polydipsia and no polyuria.  Hyperlipidemia This is a chronic problem. The problem is controlled. Pertinent negatives include no chest pain or  shortness of breath. Current antihyperlipidemic treatment includes statins.    Lab Results  Component Value Date   NA 141 02/16/2022   K 4.6 02/16/2022   CO2 26 02/16/2022   GLUCOSE 94 02/16/2022   BUN 15 02/16/2022   CREATININE 0.65 02/16/2022   CALCIUM 10.5 (H) 02/16/2022   EGFR 97 02/16/2022   GFRNONAA 98 11/25/2019   Lab Results  Component Value Date   CHOL 171 02/16/2022   HDL 65 02/16/2022   LDLCALC 87 02/16/2022   TRIG 109 02/16/2022   CHOLHDL 2.6 02/16/2022   Lab Results  Component Value Date   TSH 1.540 10/24/2021   Lab Results  Component Value Date   HGBA1C 6.5 (A) 04/18/2022   Lab Results  Component Value Date   WBC 12.0 (H) 06/23/2021   HGB 14.8 06/23/2021   HCT 42.4 06/23/2021   MCV 93 06/23/2021   PLT 224 06/23/2021   Lab Results  Component Value Date   ALT 12 02/16/2022   AST 17 02/16/2022   ALKPHOS 76 02/16/2022   BILITOT 0.2 02/16/2022   No results found for: "25OHVITD2", "25OHVITD3", "VD25OH"   Review of Systems  Constitutional:  Negative for chills, fatigue and fever.  HENT:  Negative for congestion, hearing loss, tinnitus, trouble swallowing and voice change.   Eyes:  Negative for visual disturbance.  Respiratory:  Negative for cough, chest tightness, shortness of breath and wheezing.   Cardiovascular:  Negative for chest pain, palpitations and leg swelling.  Gastrointestinal:  Negative for abdominal pain, constipation, diarrhea and vomiting.  Endocrine: Negative for polydipsia and polyuria.  Genitourinary:  Negative for dysuria, frequency, genital sores, vaginal bleeding and vaginal discharge.  Musculoskeletal:  Positive  for arthralgias and gait problem. Negative for joint swelling.  Skin:  Positive for color change. Negative for rash.  Neurological:  Negative for dizziness, tremors, light-headedness and headaches.  Hematological:  Negative for adenopathy. Does not bruise/bleed easily.  Psychiatric/Behavioral:  Negative for dysphoric  mood and sleep disturbance. The patient is not nervous/anxious.     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   Tobacco use disorder, mild, 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   Degenerative joint disease involving multiple joints 09/28/2013    Allergies  Allergen Reactions   Levofloxacin Nausea And Vomiting    Past Surgical History:  Procedure Laterality Date   COLONOSCOPY WITH PROPOFOL N/A 03/05/2016   Procedure: COLONOSCOPY WITH PROPOFOL;  Surgeon: Midge Minium, MD;  Location: [facility];  Service: Endoscopy;  Laterality: N/A;   COLONOSCOPY WITH PROPOFOL N/A 09/18/2021   Procedure: COLONOSCOPY WITH PROPOFOL;  Surgeon: Midge Minium, MD;  Location: [facility];  Service: Endoscopy;  Laterality: N/A;   ESOPHAGEAL DILATION N/A 12/29/2021   Procedure: ESOPHAGEAL DILATION;  Surgeon: Midge Minium, MD;  Location: [facility];  Service: Endoscopy;  Laterality: N/A;   ESOPHAGOGASTRODUODENOSCOPY (EGD) WITH PROPOFOL N/A 12/29/2021   Procedure: ESOPHAGOGASTRODUODENOSCOPY (EGD) WITH PROPOFOL;  Surgeon: Midge Minium, MD;  Location: [facility];  Service: Endoscopy;  Laterality: N/A;  Diabetic   HIP FRACTURE SURGERY Right 03/2013   INNER EAR SURGERY Bilateral    REPLACEMENT TOTAL KNEE Right 02/2015   SHOULDER SURGERY Bilateral    rotator cuff   TOTAL KNEE ARTHROPLASTY Left 06/05/2022    Social History   Tobacco Use   Smoking status: Former    Packs/day: 0.50     Years: 25.00    Additional pack years: 0.00    Total pack years: 12.50    Types: Cigarettes    Quit date: 03/05/2016    Years since quitting: 6.3   Smokeless tobacco: Never  Vaping Use   Vaping Use: Never used  Substance Use Topics   Alcohol use: No   Drug use: No     Medication list has been reviewed and updated.  Current Meds  Medication Sig   albuterol (VENTOLIN HFA) 108 (90 Base) MCG/ACT inhaler INHALE 2 PUFFS BY MOUTH EVERY 6 HOURS AS NEEDED FOR WHEEZING OR SHORTNESS OF BREATH   alendronate (FOSAMAX) 70 MG tablet Take 1 tablet (70 mg total) by mouth every 7 (seven) days. Take with a full glass of water on an empty stomach - do not eat or drink or recline for 30 minutes after dosing   atorvastatin (LIPITOR) 20 MG tablet TAKE 1 TABLET BY MOUTH DAILY   Blood Glucose Monitoring Suppl (ONETOUCH VERIO) w/Device KIT 1 each by Does not apply route 2 (two) times daily. Test BS twice daily.   cephALEXin (KEFLEX) 500 MG capsule Take 500 mg by mouth every 6 (six) hours.   fluticasone (FLONASE) 50 MCG/ACT nasal spray SHAKE LIQUID AND USE 2 SPRAYS IN EACH NOSTRIL DAILY   fluticasone-salmeterol (ADVAIR DISKUS) 250-50 MCG/ACT AEPB INHALE 1 PUFF INTO THE LUNGS  TWICE DAILY   gabapentin (NEURONTIN) 600 MG tablet Take 1 tablet (600 mg total) by mouth 3 (three) times daily. (Patient taking differently: Take 600 mg by mouth in the morning, at noon, in the evening, and at bedtime. Patient takes 2 qam and 2 qpm)   glimepiride (AMARYL) 2 MG tablet Take 0.5 tablets (1 mg total) by mouth daily with breakfast.   Lancets (ONETOUCH DELICA PLUS LANCET30G) MISC Use up to 4 times daily to test blood sugar as needed   metFORMIN (GLUCOPHAGE-XR) 500 MG 24 hr tablet Take 2 tablets (1,000 mg total) by mouth 2 (two) times daily with a meal.   omeprazole (PRILOSEC) 20 MG capsule TAKE 1 CAPSULE(20 MG) BY MOUTH TWICE DAILY BEFORE A MEAL   ONETOUCH ULTRA test strip USE TO TEST BLOOD SUGAR TWICE DAILY   oxyCODONE (OXY  IR/ROXICODONE) 5 MG immediate release tablet Take 5 mg by mouth every 4 (four) hours as needed (post op pain).   oxyCODONE-acetaminophen (PERCOCET) 10-325 MG tablet Take 1 tablet by mouth every 4 (four) hours as needed.   pramipexole (MIRAPEX) 0.125 MG tablet Mirapex 0.125 mg tablet  1 to 2 in pm for RLS   tiZANidine (ZANAFLEX) 4 MG tablet Take 4 mg by mouth every 6 (six) hours as needed.    [DISCONTINUED] JANUVIA 100 MG tablet TAKE 1 TABLET(100 MG) BY MOUTH DAILY       06/27/2022    9:33 AM 04/18/2022    3:26 PM 03/20/2022    8:20 AM 02/16/2022   10:23 AM  GAD 7 : Generalized Anxiety Score  Nervous, Anxious, on Edge 1 0 1 1  Control/stop worrying 1 0 0 1  Worry too much - different things 1 0 1 2  Trouble relaxing 1 0 0 2  Restless 0 0 1 2  Easily annoyed or irritable 1 0 1 3  Afraid - awful might happen 1 0 0 3  Total GAD 7 Score 6 0 4 14  Anxiety Difficulty Somewhat difficult Not difficult at all Not difficult at all Extremely difficult       06/27/2022    9:32 AM 04/18/2022    3:26 PM 03/20/2022    8:20 AM  Depression screen PHQ 2/9  Decreased Interest 0 0 0  Down, Depressed, Hopeless 3 0 0  PHQ - 2 Score 3 0 0  Altered sleeping 2 0 2  Tired, decreased energy 0 0 0  Change in appetite 0 0 0  Feeling bad or failure about yourself  1 0 0  Trouble concentrating 0 0 0  Moving slowly or fidgety/restless 0 0 0  Suicidal thoughts 0 0 0  PHQ-9 Score 6 0 2  Difficult doing work/chores Somewhat difficult Not difficult at all Not difficult at all    BP Readings from Last 3 Encounters:  06/27/22 110/72  04/18/22 110/62  03/20/22 102/64    Physical Exam Vitals and nursing note reviewed.  Constitutional:      General: She is not in acute distress.    Appearance: She is well-developed.  HENT:     Head: Normocephalic and atraumatic.     Right Ear: Tympanic membrane and ear canal normal.     Left Ear: Tympanic membrane and ear canal normal.     Nose:     Right Sinus: No  maxillary sinus tenderness.     Left Sinus: No maxillary sinus tenderness.  Eyes:     General: No scleral icterus.  Right eye: No discharge.        Left eye: No discharge.     Conjunctiva/sclera: Conjunctivae normal.  Neck:     Thyroid: No thyromegaly.     Vascular: No carotid bruit.  Cardiovascular:     Rate and Rhythm: Normal rate and regular rhythm.     Pulses: Normal pulses.     Heart sounds: Normal heart sounds.  Pulmonary:     Effort: Pulmonary effort is normal. No respiratory distress.     Breath sounds: No wheezing.  Abdominal:     General: Bowel sounds are normal.     Palpations: Abdomen is soft.     Tenderness: There is no abdominal tenderness.  Musculoskeletal:     Cervical back: Normal range of motion. No erythema.     Right lower leg: No edema.     Left lower leg: No edema.  Lymphadenopathy:     Cervical: No cervical adenopathy.  Skin:    General: Skin is warm and dry.     Findings: Erythema (and tender LLE) present. No rash.  Neurological:     Mental Status: She is alert and oriented to person, place, and time.     Cranial Nerves: No cranial nerve deficit.     Sensory: No sensory deficit.     Deep Tendon Reflexes: Reflexes are normal and symmetric.  Psychiatric:        Attention and Perception: Attention normal.        Mood and Affect: Mood normal.     Wt Readings from Last 3 Encounters:  06/27/22 127 lb (57.6 kg)  04/18/22 130 lb (59 kg)  03/20/22 130 lb (59 kg)    BP 110/72   Pulse 83   Ht 5\' 5"  (1.651 m)   Wt 127 lb (57.6 kg)   SpO2 95%   BMI 21.13 kg/m   Assessment and Plan:  Problem List Items Addressed This Visit       Endocrine   Hyperlipidemia associated with type 2 diabetes mellitus (HCC) (Chronic)    Tolerating statin medications.  No side effects noted.  She reports taking her medications regularly. LDL is  Lab Results  Component Value Date   LDLCALC 87 02/16/2022  On atorvastatin 20 mg. Will advise if dose change is  indicated.       Relevant Medications   JANUVIA 100 MG tablet   Other Relevant Orders   Lipid panel   Type II diabetes mellitus with complication (HCC) (Chronic)    Blood sugars stable without hypoglycemic symptoms or events. Currently being treated with metformin and amaryl.  Has not had Januvia - pharmacy stated she needed a new Rx Lab Results  Component Value Date   HGBA1C 6.5 (A) 04/18/2022  With a goal of < 8.0. Recommend stopping Amaryl due to some low blood sugars and resuming Januvia - Rx sent.       Relevant Medications   JANUVIA 100 MG tablet   Other Relevant Orders   Comprehensive metabolic panel   Hemoglobin A1c   TSH   Microalbumin / creatinine urine ratio   Other Visit Diagnoses     Annual physical exam    -  Primary   CRC screening and mammogram UTD doing well s/p TKA   Encounter for screening mammogram for breast cancer       Relevant Orders   MM 3D SCREENING MAMMOGRAM BILATERAL BREAST   Cellulitis of left lower extremity       Seeing Ortho for  follow up today will likely need additional antibiotics   Relevant Orders   CBC with Differential/Platelet       Return in about 4 months (around 10/28/2022) for DM.   Partially dictated using Dragon software, any errors are not intentional.  Reubin Milan, MD [facility] [place], [place]

## 2022-06-28 LAB — COMPREHENSIVE METABOLIC PANEL
ALT: 12 IU/L (ref 0–32)
AST: 19 IU/L (ref 0–40)
Albumin/Globulin Ratio: 1.8 (ref 1.2–2.2)
Albumin: 4.2 g/dL (ref 3.9–4.9)
Alkaline Phosphatase: 68 IU/L (ref 44–121)
BUN/Creatinine Ratio: 36 — ABNORMAL HIGH (ref 12–28)
BUN: 16 mg/dL (ref 8–27)
Bilirubin Total: 0.3 mg/dL (ref 0.0–1.2)
CO2: 22 mmol/L (ref 20–29)
Calcium: 9.7 mg/dL (ref 8.7–10.3)
Chloride: 99 mmol/L (ref 96–106)
Creatinine, Ser: 0.44 mg/dL — ABNORMAL LOW (ref 0.57–1.00)
Globulin, Total: 2.4 g/dL (ref 1.5–4.5)
Glucose: 86 mg/dL (ref 70–99)
Potassium: 4.1 mmol/L (ref 3.5–5.2)
Sodium: 139 mmol/L (ref 134–144)
Total Protein: 6.6 g/dL (ref 6.0–8.5)
eGFR: 107 mL/min/{1.73_m2} (ref >59)

## 2022-06-28 LAB — CBC WITH DIFFERENTIAL/PLATELET
Basophils Absolute: 0.1 10*3/uL (ref 0.0–0.2)
Basos: 1 %
EOS (ABSOLUTE): 0.2 10*3/uL (ref 0.0–0.4)
Eos: 2 %
Hematocrit: 40.2 % (ref 34.0–46.6)
Hemoglobin: 13 g/dL (ref 11.1–15.9)
Immature Grans (Abs): 0 10*3/uL (ref 0.0–0.1)
Immature Granulocytes: 0 %
Lymphocytes Absolute: 3.7 10*3/uL — ABNORMAL HIGH (ref 0.7–3.1)
Lymphs: 36 %
MCH: 30.3 pg (ref 26.6–33.0)
MCHC: 32.3 g/dL (ref 31.5–35.7)
MCV: 94 fL (ref 79–97)
Monocytes Absolute: 0.7 10*3/uL (ref 0.1–0.9)
Monocytes: 7 %
Neutrophils Absolute: 5.5 10*3/uL (ref 1.4–7.0)
Neutrophils: 54 %
Platelets: 406 10*3/uL (ref 150–450)
RBC: 4.29 x10E6/uL (ref 3.77–5.28)
RDW: 12.3 % (ref 11.7–15.4)
WBC: 10.2 10*3/uL (ref 3.4–10.8)

## 2022-06-28 LAB — HEMOGLOBIN A1C
Est. average glucose Bld gHb Est-mCnc: 126 mg/dL
Hgb A1c MFr Bld: 6 % — ABNORMAL HIGH (ref 4.8–5.6)

## 2022-06-28 LAB — MICROALBUMIN / CREATININE URINE RATIO
Creatinine, Urine: 18.6 mg/dL
Microalb/Creat Ratio: <16 mg/g creat (ref 0–29)
Microalbumin, Urine: <3 ug/mL

## 2022-06-28 LAB — TSH: TSH: 0.898 u[IU]/mL (ref 0.450–4.500)

## 2022-06-28 LAB — LIPID PANEL
Chol/HDL Ratio: 2.8 ratio (ref 0.0–4.4)
Cholesterol, Total: 153 mg/dL (ref 100–199)
HDL: 54 mg/dL (ref >39)
LDL Chol Calc (NIH): 79 mg/dL (ref 0–99)
Triglycerides: 112 mg/dL (ref 0–149)
VLDL Cholesterol Cal: 20 mg/dL (ref 5–40)

## 2022-06-29 DIAGNOSIS — Z471 Aftercare following joint replacement surgery: Secondary | ICD-10-CM | POA: Diagnosis not present

## 2022-06-29 DIAGNOSIS — I10 Essential (primary) hypertension: Secondary | ICD-10-CM | POA: Diagnosis not present

## 2022-06-29 DIAGNOSIS — E119 Type 2 diabetes mellitus without complications: Secondary | ICD-10-CM | POA: Diagnosis not present

## 2022-06-29 DIAGNOSIS — M810 Age-related osteoporosis without current pathological fracture: Secondary | ICD-10-CM | POA: Diagnosis not present

## 2022-06-29 DIAGNOSIS — Z96652 Presence of left artificial knee joint: Secondary | ICD-10-CM | POA: Diagnosis not present

## 2022-06-29 DIAGNOSIS — M1712 Unilateral primary osteoarthritis, left knee: Secondary | ICD-10-CM | POA: Diagnosis not present

## 2022-06-29 DIAGNOSIS — F32A Depression, unspecified: Secondary | ICD-10-CM | POA: Diagnosis not present

## 2022-06-29 DIAGNOSIS — J439 Emphysema, unspecified: Secondary | ICD-10-CM | POA: Diagnosis not present

## 2022-06-29 DIAGNOSIS — G2581 Restless legs syndrome: Secondary | ICD-10-CM | POA: Diagnosis not present

## 2022-06-29 DIAGNOSIS — G114 Hereditary spastic paraplegia: Secondary | ICD-10-CM | POA: Diagnosis not present

## 2022-07-04 DIAGNOSIS — J439 Emphysema, unspecified: Secondary | ICD-10-CM | POA: Diagnosis not present

## 2022-07-04 DIAGNOSIS — M810 Age-related osteoporosis without current pathological fracture: Secondary | ICD-10-CM | POA: Diagnosis not present

## 2022-07-04 DIAGNOSIS — G2581 Restless legs syndrome: Secondary | ICD-10-CM | POA: Diagnosis not present

## 2022-07-04 DIAGNOSIS — Z471 Aftercare following joint replacement surgery: Secondary | ICD-10-CM | POA: Diagnosis not present

## 2022-07-04 DIAGNOSIS — G114 Hereditary spastic paraplegia: Secondary | ICD-10-CM | POA: Diagnosis not present

## 2022-07-04 DIAGNOSIS — M1712 Unilateral primary osteoarthritis, left knee: Secondary | ICD-10-CM | POA: Diagnosis not present

## 2022-07-04 DIAGNOSIS — F32A Depression, unspecified: Secondary | ICD-10-CM | POA: Diagnosis not present

## 2022-07-04 DIAGNOSIS — I10 Essential (primary) hypertension: Secondary | ICD-10-CM | POA: Diagnosis not present

## 2022-07-04 DIAGNOSIS — E119 Type 2 diabetes mellitus without complications: Secondary | ICD-10-CM | POA: Diagnosis not present

## 2022-07-04 DIAGNOSIS — Z96652 Presence of left artificial knee joint: Secondary | ICD-10-CM | POA: Diagnosis not present

## 2022-07-05 ENCOUNTER — Telehealth: Payer: Self-pay | Admitting: Internal Medicine

## 2022-07-05 NOTE — Telephone Encounter (Signed)
Copied from CRM [id number]. Topic: Medicare AWV >> Jul 05, 2022 11:26 AM Payton Doughty wrote: Reason for CRM: Called patient to schedule Medicare Annual Wellness Visit (AWV). No voicemail available to leave a message.  Last date of AWV: 07/24/21  Please schedule an appointment at any time with Kennedy Bucker, LPN  .  If any questions, please contact me.  Thank you ,  Verlee Rossetti; Care Guide Ambulatory Clinical Support [place] [facility] Direct Dial: [phone]

## 2022-07-06 DIAGNOSIS — Z471 Aftercare following joint replacement surgery: Secondary | ICD-10-CM | POA: Diagnosis not present

## 2022-07-06 DIAGNOSIS — M810 Age-related osteoporosis without current pathological fracture: Secondary | ICD-10-CM | POA: Diagnosis not present

## 2022-07-06 DIAGNOSIS — I10 Essential (primary) hypertension: Secondary | ICD-10-CM | POA: Diagnosis not present

## 2022-07-06 DIAGNOSIS — M1712 Unilateral primary osteoarthritis, left knee: Secondary | ICD-10-CM | POA: Diagnosis not present

## 2022-07-06 DIAGNOSIS — Z96652 Presence of left artificial knee joint: Secondary | ICD-10-CM | POA: Diagnosis not present

## 2022-07-06 DIAGNOSIS — E119 Type 2 diabetes mellitus without complications: Secondary | ICD-10-CM | POA: Diagnosis not present

## 2022-07-06 DIAGNOSIS — F32A Depression, unspecified: Secondary | ICD-10-CM | POA: Diagnosis not present

## 2022-07-06 DIAGNOSIS — G2581 Restless legs syndrome: Secondary | ICD-10-CM | POA: Diagnosis not present

## 2022-07-06 DIAGNOSIS — J439 Emphysema, unspecified: Secondary | ICD-10-CM | POA: Diagnosis not present

## 2022-07-06 DIAGNOSIS — G114 Hereditary spastic paraplegia: Secondary | ICD-10-CM | POA: Diagnosis not present

## 2022-07-11 DIAGNOSIS — R011 Cardiac murmur, unspecified: Secondary | ICD-10-CM | POA: Diagnosis not present

## 2022-07-11 DIAGNOSIS — M1712 Unilateral primary osteoarthritis, left knee: Secondary | ICD-10-CM | POA: Diagnosis not present

## 2022-07-11 DIAGNOSIS — G114 Hereditary spastic paraplegia: Secondary | ICD-10-CM | POA: Diagnosis not present

## 2022-07-11 DIAGNOSIS — Z96651 Presence of right artificial knee joint: Secondary | ICD-10-CM | POA: Diagnosis not present

## 2022-07-11 DIAGNOSIS — Z9181 History of falling: Secondary | ICD-10-CM | POA: Diagnosis not present

## 2022-07-11 DIAGNOSIS — K219 Gastro-esophageal reflux disease without esophagitis: Secondary | ICD-10-CM | POA: Diagnosis not present

## 2022-07-11 DIAGNOSIS — G2581 Restless legs syndrome: Secondary | ICD-10-CM | POA: Diagnosis not present

## 2022-07-11 DIAGNOSIS — Z87891 Personal history of nicotine dependence: Secondary | ICD-10-CM | POA: Diagnosis not present

## 2022-07-11 DIAGNOSIS — Z96698 Presence of other orthopedic joint implants: Secondary | ICD-10-CM | POA: Diagnosis not present

## 2022-07-11 DIAGNOSIS — Z7951 Long term (current) use of inhaled steroids: Secondary | ICD-10-CM | POA: Diagnosis not present

## 2022-07-11 DIAGNOSIS — E785 Hyperlipidemia, unspecified: Secondary | ICD-10-CM | POA: Diagnosis not present

## 2022-07-11 DIAGNOSIS — Z7984 Long term (current) use of oral hypoglycemic drugs: Secondary | ICD-10-CM | POA: Diagnosis not present

## 2022-07-11 DIAGNOSIS — Z96652 Presence of left artificial knee joint: Secondary | ICD-10-CM | POA: Diagnosis not present

## 2022-07-11 DIAGNOSIS — J439 Emphysema, unspecified: Secondary | ICD-10-CM | POA: Diagnosis not present

## 2022-07-11 DIAGNOSIS — M549 Dorsalgia, unspecified: Secondary | ICD-10-CM | POA: Diagnosis not present

## 2022-07-11 DIAGNOSIS — I10 Essential (primary) hypertension: Secondary | ICD-10-CM | POA: Diagnosis not present

## 2022-07-11 DIAGNOSIS — Z471 Aftercare following joint replacement surgery: Secondary | ICD-10-CM | POA: Diagnosis not present

## 2022-07-11 DIAGNOSIS — M810 Age-related osteoporosis without current pathological fracture: Secondary | ICD-10-CM | POA: Diagnosis not present

## 2022-07-11 DIAGNOSIS — E119 Type 2 diabetes mellitus without complications: Secondary | ICD-10-CM | POA: Diagnosis not present

## 2022-07-11 DIAGNOSIS — Z7982 Long term (current) use of aspirin: Secondary | ICD-10-CM | POA: Diagnosis not present

## 2022-07-11 DIAGNOSIS — F32A Depression, unspecified: Secondary | ICD-10-CM | POA: Diagnosis not present

## 2022-07-17 DIAGNOSIS — Z96698 Presence of other orthopedic joint implants: Secondary | ICD-10-CM | POA: Diagnosis not present

## 2022-07-17 DIAGNOSIS — Z7984 Long term (current) use of oral hypoglycemic drugs: Secondary | ICD-10-CM | POA: Diagnosis not present

## 2022-07-17 DIAGNOSIS — I10 Essential (primary) hypertension: Secondary | ICD-10-CM | POA: Diagnosis not present

## 2022-07-17 DIAGNOSIS — Z471 Aftercare following joint replacement surgery: Secondary | ICD-10-CM | POA: Diagnosis not present

## 2022-07-17 DIAGNOSIS — G2581 Restless legs syndrome: Secondary | ICD-10-CM | POA: Diagnosis not present

## 2022-07-17 DIAGNOSIS — Z7951 Long term (current) use of inhaled steroids: Secondary | ICD-10-CM | POA: Diagnosis not present

## 2022-07-17 DIAGNOSIS — G114 Hereditary spastic paraplegia: Secondary | ICD-10-CM | POA: Diagnosis not present

## 2022-07-17 DIAGNOSIS — E785 Hyperlipidemia, unspecified: Secondary | ICD-10-CM | POA: Diagnosis not present

## 2022-07-17 DIAGNOSIS — M549 Dorsalgia, unspecified: Secondary | ICD-10-CM | POA: Diagnosis not present

## 2022-07-17 DIAGNOSIS — K219 Gastro-esophageal reflux disease without esophagitis: Secondary | ICD-10-CM | POA: Diagnosis not present

## 2022-07-17 DIAGNOSIS — Z96651 Presence of right artificial knee joint: Secondary | ICD-10-CM | POA: Diagnosis not present

## 2022-07-17 DIAGNOSIS — J439 Emphysema, unspecified: Secondary | ICD-10-CM | POA: Diagnosis not present

## 2022-07-17 DIAGNOSIS — Z7982 Long term (current) use of aspirin: Secondary | ICD-10-CM | POA: Diagnosis not present

## 2022-07-17 DIAGNOSIS — R011 Cardiac murmur, unspecified: Secondary | ICD-10-CM | POA: Diagnosis not present

## 2022-07-17 DIAGNOSIS — F32A Depression, unspecified: Secondary | ICD-10-CM | POA: Diagnosis not present

## 2022-07-17 DIAGNOSIS — Z96652 Presence of left artificial knee joint: Secondary | ICD-10-CM | POA: Diagnosis not present

## 2022-07-17 DIAGNOSIS — M1712 Unilateral primary osteoarthritis, left knee: Secondary | ICD-10-CM | POA: Diagnosis not present

## 2022-07-17 DIAGNOSIS — Z9181 History of falling: Secondary | ICD-10-CM | POA: Diagnosis not present

## 2022-07-17 DIAGNOSIS — M810 Age-related osteoporosis without current pathological fracture: Secondary | ICD-10-CM | POA: Diagnosis not present

## 2022-07-17 DIAGNOSIS — Z87891 Personal history of nicotine dependence: Secondary | ICD-10-CM | POA: Diagnosis not present

## 2022-07-17 DIAGNOSIS — E119 Type 2 diabetes mellitus without complications: Secondary | ICD-10-CM | POA: Diagnosis not present

## 2022-07-23 ENCOUNTER — Ambulatory Visit: Admitting: Internal Medicine

## 2022-07-25 DIAGNOSIS — Z471 Aftercare following joint replacement surgery: Secondary | ICD-10-CM | POA: Diagnosis not present

## 2022-07-25 DIAGNOSIS — K219 Gastro-esophageal reflux disease without esophagitis: Secondary | ICD-10-CM | POA: Diagnosis not present

## 2022-07-25 DIAGNOSIS — Z9181 History of falling: Secondary | ICD-10-CM | POA: Diagnosis not present

## 2022-07-25 DIAGNOSIS — Z7984 Long term (current) use of oral hypoglycemic drugs: Secondary | ICD-10-CM | POA: Diagnosis not present

## 2022-07-25 DIAGNOSIS — Z96652 Presence of left artificial knee joint: Secondary | ICD-10-CM | POA: Diagnosis not present

## 2022-07-25 DIAGNOSIS — Z7951 Long term (current) use of inhaled steroids: Secondary | ICD-10-CM | POA: Diagnosis not present

## 2022-07-25 DIAGNOSIS — G2581 Restless legs syndrome: Secondary | ICD-10-CM | POA: Diagnosis not present

## 2022-07-25 DIAGNOSIS — R011 Cardiac murmur, unspecified: Secondary | ICD-10-CM | POA: Diagnosis not present

## 2022-07-25 DIAGNOSIS — M549 Dorsalgia, unspecified: Secondary | ICD-10-CM | POA: Diagnosis not present

## 2022-07-25 DIAGNOSIS — Z96698 Presence of other orthopedic joint implants: Secondary | ICD-10-CM | POA: Diagnosis not present

## 2022-07-25 DIAGNOSIS — Z87891 Personal history of nicotine dependence: Secondary | ICD-10-CM | POA: Diagnosis not present

## 2022-07-25 DIAGNOSIS — F32A Depression, unspecified: Secondary | ICD-10-CM | POA: Diagnosis not present

## 2022-07-25 DIAGNOSIS — I10 Essential (primary) hypertension: Secondary | ICD-10-CM | POA: Diagnosis not present

## 2022-07-25 DIAGNOSIS — E119 Type 2 diabetes mellitus without complications: Secondary | ICD-10-CM | POA: Diagnosis not present

## 2022-07-25 DIAGNOSIS — G114 Hereditary spastic paraplegia: Secondary | ICD-10-CM | POA: Diagnosis not present

## 2022-07-25 DIAGNOSIS — E785 Hyperlipidemia, unspecified: Secondary | ICD-10-CM | POA: Diagnosis not present

## 2022-07-25 DIAGNOSIS — Z96651 Presence of right artificial knee joint: Secondary | ICD-10-CM | POA: Diagnosis not present

## 2022-07-25 DIAGNOSIS — J439 Emphysema, unspecified: Secondary | ICD-10-CM | POA: Diagnosis not present

## 2022-07-25 DIAGNOSIS — Z7982 Long term (current) use of aspirin: Secondary | ICD-10-CM | POA: Diagnosis not present

## 2022-07-25 DIAGNOSIS — M1712 Unilateral primary osteoarthritis, left knee: Secondary | ICD-10-CM | POA: Diagnosis not present

## 2022-07-25 DIAGNOSIS — M810 Age-related osteoporosis without current pathological fracture: Secondary | ICD-10-CM | POA: Diagnosis not present

## 2022-07-31 LAB — HM DIABETES EYE EXAM

## 2022-08-01 DIAGNOSIS — Z96652 Presence of left artificial knee joint: Secondary | ICD-10-CM | POA: Diagnosis not present

## 2022-08-01 DIAGNOSIS — Z7982 Long term (current) use of aspirin: Secondary | ICD-10-CM | POA: Diagnosis not present

## 2022-08-01 DIAGNOSIS — I10 Essential (primary) hypertension: Secondary | ICD-10-CM | POA: Diagnosis not present

## 2022-08-01 DIAGNOSIS — Z471 Aftercare following joint replacement surgery: Secondary | ICD-10-CM | POA: Diagnosis not present

## 2022-08-01 DIAGNOSIS — Z7951 Long term (current) use of inhaled steroids: Secondary | ICD-10-CM | POA: Diagnosis not present

## 2022-08-01 DIAGNOSIS — Z9181 History of falling: Secondary | ICD-10-CM | POA: Diagnosis not present

## 2022-08-01 DIAGNOSIS — J439 Emphysema, unspecified: Secondary | ICD-10-CM | POA: Diagnosis not present

## 2022-08-01 DIAGNOSIS — E119 Type 2 diabetes mellitus without complications: Secondary | ICD-10-CM | POA: Diagnosis not present

## 2022-08-01 DIAGNOSIS — M1712 Unilateral primary osteoarthritis, left knee: Secondary | ICD-10-CM | POA: Diagnosis not present

## 2022-08-01 DIAGNOSIS — Z7984 Long term (current) use of oral hypoglycemic drugs: Secondary | ICD-10-CM | POA: Diagnosis not present

## 2022-08-01 DIAGNOSIS — F32A Depression, unspecified: Secondary | ICD-10-CM | POA: Diagnosis not present

## 2022-08-01 DIAGNOSIS — Z96651 Presence of right artificial knee joint: Secondary | ICD-10-CM | POA: Diagnosis not present

## 2022-08-01 DIAGNOSIS — K219 Gastro-esophageal reflux disease without esophagitis: Secondary | ICD-10-CM | POA: Diagnosis not present

## 2022-08-01 DIAGNOSIS — Z87891 Personal history of nicotine dependence: Secondary | ICD-10-CM | POA: Diagnosis not present

## 2022-08-01 DIAGNOSIS — R011 Cardiac murmur, unspecified: Secondary | ICD-10-CM | POA: Diagnosis not present

## 2022-08-01 DIAGNOSIS — G2581 Restless legs syndrome: Secondary | ICD-10-CM | POA: Diagnosis not present

## 2022-08-01 DIAGNOSIS — M810 Age-related osteoporosis without current pathological fracture: Secondary | ICD-10-CM | POA: Diagnosis not present

## 2022-08-01 DIAGNOSIS — G114 Hereditary spastic paraplegia: Secondary | ICD-10-CM | POA: Diagnosis not present

## 2022-08-01 DIAGNOSIS — Z96698 Presence of other orthopedic joint implants: Secondary | ICD-10-CM | POA: Diagnosis not present

## 2022-08-01 DIAGNOSIS — E785 Hyperlipidemia, unspecified: Secondary | ICD-10-CM | POA: Diagnosis not present

## 2022-08-01 DIAGNOSIS — M549 Dorsalgia, unspecified: Secondary | ICD-10-CM | POA: Diagnosis not present

## 2022-08-07 DIAGNOSIS — M791 Myalgia, unspecified site: Secondary | ICD-10-CM | POA: Diagnosis not present

## 2022-08-07 DIAGNOSIS — Z96652 Presence of left artificial knee joint: Secondary | ICD-10-CM | POA: Diagnosis not present

## 2022-08-07 DIAGNOSIS — G2581 Restless legs syndrome: Secondary | ICD-10-CM | POA: Diagnosis not present

## 2022-08-07 DIAGNOSIS — Z79899 Other long term (current) drug therapy: Secondary | ICD-10-CM | POA: Diagnosis not present

## 2022-08-07 DIAGNOSIS — R202 Paresthesia of skin: Secondary | ICD-10-CM | POA: Diagnosis not present

## 2022-08-07 DIAGNOSIS — M79642 Pain in left hand: Secondary | ICD-10-CM | POA: Diagnosis not present

## 2022-08-07 DIAGNOSIS — E1140 Type 2 diabetes mellitus with diabetic neuropathy, unspecified: Secondary | ICD-10-CM | POA: Diagnosis not present

## 2022-08-07 DIAGNOSIS — G894 Chronic pain syndrome: Secondary | ICD-10-CM | POA: Diagnosis not present

## 2022-08-07 DIAGNOSIS — M47816 Spondylosis without myelopathy or radiculopathy, lumbar region: Secondary | ICD-10-CM | POA: Diagnosis not present

## 2022-08-07 DIAGNOSIS — M47812 Spondylosis without myelopathy or radiculopathy, cervical region: Secondary | ICD-10-CM | POA: Diagnosis not present

## 2022-08-16 DIAGNOSIS — R600 Localized edema: Secondary | ICD-10-CM | POA: Diagnosis not present

## 2022-08-16 DIAGNOSIS — M12572 Traumatic arthropathy, left ankle and foot: Secondary | ICD-10-CM | POA: Diagnosis not present

## 2022-08-16 DIAGNOSIS — M65872 Other synovitis and tenosynovitis, left ankle and foot: Secondary | ICD-10-CM | POA: Diagnosis not present

## 2022-08-21 ENCOUNTER — Other Ambulatory Visit: Payer: Self-pay | Admitting: Internal Medicine

## 2022-08-21 DIAGNOSIS — R1310 Dysphagia, unspecified: Secondary | ICD-10-CM

## 2022-08-21 DIAGNOSIS — M65872 Other synovitis and tenosynovitis, left ankle and foot: Secondary | ICD-10-CM | POA: Diagnosis not present

## 2022-08-21 DIAGNOSIS — E1169 Type 2 diabetes mellitus with other specified complication: Secondary | ICD-10-CM

## 2022-08-21 DIAGNOSIS — M818 Other osteoporosis without current pathological fracture: Secondary | ICD-10-CM

## 2022-08-30 DIAGNOSIS — D7589 Other specified diseases of blood and blood-forming organs: Secondary | ICD-10-CM | POA: Diagnosis not present

## 2022-08-30 DIAGNOSIS — Z96653 Presence of artificial knee joint, bilateral: Secondary | ICD-10-CM | POA: Diagnosis not present

## 2022-08-30 DIAGNOSIS — M79605 Pain in left leg: Secondary | ICD-10-CM | POA: Diagnosis not present

## 2022-09-03 DIAGNOSIS — M6281 Muscle weakness (generalized): Secondary | ICD-10-CM | POA: Diagnosis not present

## 2022-09-03 DIAGNOSIS — M25552 Pain in left hip: Secondary | ICD-10-CM | POA: Diagnosis not present

## 2022-09-03 DIAGNOSIS — M5451 Vertebrogenic low back pain: Secondary | ICD-10-CM | POA: Diagnosis not present

## 2022-09-03 DIAGNOSIS — M25562 Pain in left knee: Secondary | ICD-10-CM | POA: Diagnosis not present

## 2022-09-24 DIAGNOSIS — Z96652 Presence of left artificial knee joint: Secondary | ICD-10-CM | POA: Diagnosis not present

## 2022-09-24 DIAGNOSIS — M25562 Pain in left knee: Secondary | ICD-10-CM | POA: Diagnosis not present

## 2022-09-25 ENCOUNTER — Encounter: Payer: Self-pay | Admitting: Internal Medicine

## 2022-10-01 ENCOUNTER — Telehealth: Payer: Self-pay | Admitting: Internal Medicine

## 2022-10-01 NOTE — Telephone Encounter (Signed)
Copied from CRM [id number]. Topic: Medicare AWV >> Oct 01, 2022  9:33 AM Payton Doughty wrote: Reason for CRM: Called 10/01/2022 to sched AWV - MAILBOX FULL  Verlee Rossetti; Care Guide Ambulatory Clinical Support [place] [facility] Direct Dial: [phone]

## 2022-10-31 ENCOUNTER — Encounter: Payer: Self-pay | Admitting: Internal Medicine

## 2022-10-31 ENCOUNTER — Ambulatory Visit (INDEPENDENT_AMBULATORY_CARE_PROVIDER_SITE_OTHER): Payer: Medicare HMO | Admitting: Internal Medicine

## 2022-10-31 ENCOUNTER — Ambulatory Visit (INDEPENDENT_AMBULATORY_CARE_PROVIDER_SITE_OTHER): Payer: Medicare HMO

## 2022-10-31 VITALS — BP 118/64 | Wt 130.0 lb

## 2022-10-31 VITALS — BP 118/64 | HR 73 | Ht 65.0 in | Wt 130.0 lb

## 2022-10-31 DIAGNOSIS — E118 Type 2 diabetes mellitus with unspecified complications: Secondary | ICD-10-CM | POA: Diagnosis not present

## 2022-10-31 DIAGNOSIS — Z23 Encounter for immunization: Secondary | ICD-10-CM

## 2022-10-31 DIAGNOSIS — Z0000 Encounter for general adult medical examination without abnormal findings: Secondary | ICD-10-CM

## 2022-10-31 DIAGNOSIS — Z7984 Long term (current) use of oral hypoglycemic drugs: Secondary | ICD-10-CM | POA: Diagnosis not present

## 2022-10-31 LAB — POCT GLYCOSYLATED HEMOGLOBIN (HGB A1C): Hemoglobin A1C: 6.9 % — AB (ref 4.0–5.6)

## 2022-10-31 MED ORDER — JANUVIA 100 MG PO TABS
ORAL_TABLET | ORAL | 3 refills | Status: DC
Start: 2022-10-31 — End: 2023-11-07

## 2022-10-31 NOTE — Assessment & Plan Note (Addendum)
Blood sugars stable without hypoglycemic symptoms or events. Current regimen is [place] and metformin. Changes made last visit are stopping Amaryl due to low BS and resuming Januvia.  She was taking it until about 2 weeks ago. BS at home in the 120-130 range. Lab Results  Component Value Date   HGBA1C 6.0 (H) 06/27/2022  A1C today = 6.9 Continue metformin, refill [place]

## 2022-10-31 NOTE — Progress Notes (Signed)
Date:  10/31/2022   Name:  Hannah [name]   DOB:  1955/09/11   MRN:  [id number]   Chief Complaint: Diabetes  Diabetes She presents for her follow-up diabetic visit. She has type 2 diabetes mellitus. Pertinent negatives for hypoglycemia include no headaches or tremors. Pertinent negatives for diabetes include no chest pain, no fatigue, no polydipsia and no polyuria. Current diabetic treatments: metformin and januvia. There is no change in her home blood glucose trend. An ACE inhibitor/angiotensin II receptor blocker is not being taken. Eye exam is current.    Lab Results  Component Value Date   NA 139 06/27/2022   K 4.1 06/27/2022   CO2 22 06/27/2022   GLUCOSE 86 06/27/2022   BUN 16 06/27/2022   CREATININE 0.44 (L) 06/27/2022   CALCIUM 9.7 06/27/2022   EGFR 107 06/27/2022   GFRNONAA 98 11/25/2019   Lab Results  Component Value Date   CHOL 153 06/27/2022   HDL 54 06/27/2022   LDLCALC 79 06/27/2022   TRIG 112 06/27/2022   CHOLHDL 2.8 06/27/2022   Lab Results  Component Value Date   TSH 0.898 06/27/2022   Lab Results  Component Value Date   HGBA1C 6.9 (A) 10/31/2022   Lab Results  Component Value Date   WBC 10.2 06/27/2022   HGB 13.0 06/27/2022   HCT 40.2 06/27/2022   MCV 94 06/27/2022   PLT 406 06/27/2022   Lab Results  Component Value Date   ALT 12 06/27/2022   AST 19 06/27/2022   ALKPHOS 68 06/27/2022   BILITOT 0.3 06/27/2022   No results found for: "25OHVITD2", "25OHVITD3", "VD25OH"   Review of Systems  Constitutional:  Negative for appetite change, fatigue, fever and unexpected weight change.  Eyes:  Negative for visual disturbance.  Respiratory:  Negative for cough, chest tightness and shortness of breath.   Cardiovascular:  Negative for chest pain, palpitations and leg swelling.  Gastrointestinal:  Negative for abdominal pain.  Endocrine: Negative for polydipsia and polyuria.  Genitourinary:  Negative for dysuria and hematuria.  Musculoskeletal:   Positive for arthralgias and gait problem.  Neurological:  Negative for tremors, numbness and headaches.  Psychiatric/Behavioral:  Negative for dysphoric mood.     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   Tobacco use disorder, mild, 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   Degenerative joint disease involving multiple joints 09/28/2013    Allergies  Allergen Reactions   Levofloxacin Nausea And Vomiting    Past Surgical History:  Procedure Laterality Date   COLONOSCOPY WITH PROPOFOL N/A 03/05/2016   Procedure: COLONOSCOPY WITH PROPOFOL;  Surgeon: Midge Minium, MD;  Location: [facility];  Service: Endoscopy;  Laterality: N/A;   COLONOSCOPY WITH PROPOFOL N/A 09/18/2021   Procedure: COLONOSCOPY WITH PROPOFOL;  Surgeon: Midge Minium, MD;  Location: [facility];  Service: Endoscopy;  Laterality: N/A;   ESOPHAGEAL DILATION N/A 12/29/2021   Procedure: ESOPHAGEAL DILATION;  Surgeon: Midge Minium, MD;  Location: [facility];  Service: Endoscopy;  Laterality: N/A;   ESOPHAGOGASTRODUODENOSCOPY (EGD) WITH  PROPOFOL N/A 12/29/2021   Procedure: ESOPHAGOGASTRODUODENOSCOPY (EGD) WITH PROPOFOL;  Surgeon: Midge Minium, MD;  Location: [facility];  Service: Endoscopy;  Laterality: N/A;  Diabetic   HIP FRACTURE SURGERY Right 03/2013   INNER EAR SURGERY Bilateral    REPLACEMENT TOTAL KNEE Right 02/2015   SHOULDER SURGERY Bilateral    rotator cuff   TOTAL KNEE ARTHROPLASTY  Left 06/05/2022    Social History   Tobacco Use   Smoking status: Former    Current packs/day: 0.00    Average packs/day: 0.5 packs/day for 25.0 years (12.5 ttl pk-yrs)    Types: Cigarettes    Start date: 03/06/1991    Quit date: 03/05/2016    Years since quitting: 6.6   Smokeless tobacco: Never  Vaping Use   Vaping status: Never Used  Substance Use Topics   Alcohol use: No   Drug use: No     Medication list has been reviewed and updated.  Current Meds  Medication Sig   albuterol (VENTOLIN HFA) 108 (90 Base) MCG/ACT inhaler INHALE 2 PUFFS BY MOUTH EVERY 6 HOURS AS NEEDED FOR WHEEZING OR SHORTNESS OF BREATH   alendronate (FOSAMAX) 70 MG tablet TAKE 1 TABLET BY MOUTH WITH A FULL GLASS OF WATER ON AN EMPTY STOMACH EVERY 7 DAYS DO NOT EAT OR DRINK OR RECLINE FOR 30 MINUTES AFTER DOSING   atorvastatin (LIPITOR) 20 MG tablet TAKE 1 TABLET BY MOUTH DAILY   chlorzoxazone (PARAFON) 500 MG tablet Take 1 tablet by mouth 3 (three) times daily as needed.   Flavocoxid 500 MG CAPS Take by oral route twice per day.   fluticasone (FLONASE) 50 MCG/ACT nasal spray SHAKE LIQUID AND USE 2 SPRAYS IN EACH NOSTRIL DAILY   fluticasone-salmeterol (ADVAIR DISKUS) 250-50 MCG/ACT AEPB INHALE 1 PUFF INTO THE LUNGS TWICE DAILY   gabapentin (NEURONTIN) 600 MG tablet Take 1 tablet (600 mg total) by mouth 3 (three) times daily. (Patient taking differently: Take 600 mg by mouth in the morning, at noon, in the evening, and at bedtime. Patient takes 2 qam and 2 qpm)   Lancets (ONETOUCH DELICA PLUS LANCET30G) MISC Use up to 4 times daily to test blood sugar as needed   metFORMIN (GLUCOPHAGE-XR) 500 MG 24 hr tablet Take 2 tablets (1,000 mg total) by mouth 2 (two) times daily with a meal.   omeprazole (PRILOSEC) 20 MG capsule TAKE 1 CAPSULE(20 MG) BY MOUTH TWICE DAILY BEFORE A MEAL   ONETOUCH ULTRA test strip USE TO TEST BLOOD SUGAR TWICE DAILY   oxyCODONE (OXY IR/ROXICODONE) 5 MG immediate release tablet Take 5 mg by  mouth every 4 (four) hours as needed (post op pain).   oxyCODONE-acetaminophen (PERCOCET) 10-325 MG tablet Take 1 tablet by mouth every 4 (four) hours as needed.   pramipexole (MIRAPEX) 0.125 MG tablet Mirapex 0.125 mg tablet  1 to 2 in pm for RLS   tiZANidine (ZANAFLEX) 4 MG tablet Take 4 mg by mouth every 6 (six) hours as needed.    [DISCONTINUED] Blood Glucose Monitoring Suppl (ONETOUCH VERIO) w/Device KIT 1 each by Does not apply route 2 (two) times daily. Test BS twice daily.   [DISCONTINUED] indomethacin (INDOCIN) 50 MG capsule TAKE 1 CAPSULE BY MOUTH THREE TIMES DAILY AS DIRECTED FOR GOUT       10/31/2022    1:12 PM 06/27/2022    9:33 AM 04/18/2022    3:26 PM 03/20/2022    8:20 AM  GAD 7 : Generalized Anxiety Score  Nervous, Anxious, on Edge 0  1 0 1  Control/stop worrying 0 1 0 0  Worry too much - different things 0 1 0 1  Trouble relaxing 0 1 0 0  Restless 0 0 0 1  Easily annoyed or irritable 0 1 0 1  Afraid - awful might happen 0 1 0 0  Total GAD 7 Score 0 6 0 4  Anxiety Difficulty Not difficult at all Somewhat difficult Not difficult at all Not difficult at all       10/31/2022    1:12 PM 06/27/2022    9:32 AM 04/18/2022    3:26 PM  Depression screen PHQ 2/9  Decreased Interest 0 0 0  Down, Depressed, Hopeless 0 3 0  PHQ - 2 Score 0 3 0  Altered sleeping 0 2 0  Tired, decreased energy 0 0 0  Change in appetite 1 0 0  Feeling bad or failure about yourself  0 1 0  Trouble concentrating 0 0 0  Moving slowly or fidgety/restless 0 0 0  Suicidal thoughts 0 0 0  PHQ-9 Score 1 6 0  Difficult doing work/chores Not difficult at all Somewhat difficult Not difficult at all    BP Readings from Last 3 Encounters:  10/31/22 118/64  10/31/22 118/64  06/27/22 110/72    Physical Exam Vitals and nursing note reviewed.  Constitutional:      General: She is not in acute distress.    Appearance: She is well-developed.  HENT:     Head: Normocephalic and atraumatic.  Neck:      Vascular: No carotid bruit.  Cardiovascular:     Rate and Rhythm: Normal rate and regular rhythm.     Heart sounds: No murmur heard. Pulmonary:     Effort: Pulmonary effort is normal. No respiratory distress.     Breath sounds: Decreased breath sounds present. No wheezing.  Lymphadenopathy:     Cervical: No cervical adenopathy.  Skin:    General: Skin is warm and dry.     Findings: No rash.  Neurological:     Mental Status: She is alert and oriented to person, place, and time.     Gait: Gait abnormal.  Psychiatric:        Mood and Affect: Mood normal.        Behavior: Behavior normal.     Wt Readings from Last 3 Encounters:  10/31/22 130 lb (59 kg)  10/31/22 130 lb (59 kg)  06/27/22 127 lb (57.6 kg)    BP 118/64   Pulse 73   Ht 5\' 5"  (1.651 m)   Wt 130 lb (59 kg)   SpO2 94%   BMI 21.63 kg/m   Assessment and Plan:  Problem List Items Addressed This Visit       Unprioritized   Type II diabetes mellitus with complication (HCC) - Primary (Chronic)    Blood sugars stable without hypoglycemic symptoms or events. Current regimen is [place] and metformin. Changes made last visit are stopping Amaryl due to low BS and resuming Januvia.  She was taking it until about 2 weeks ago. BS at home in the 120-130 range. Lab Results  Component Value Date   HGBA1C 6.0 (H) 06/27/2022  A1C today = 6.9 Continue metformin, refill Januva       Relevant Medications   JANUVIA 100 MG tablet   Other Relevant Orders   POCT glycosylated hemoglobin (Hb A1C) (Completed)   Other Visit Diagnoses     Long term current use of oral hypoglycemic drug  Need for influenza vaccination       Relevant Orders   Flu Vaccine Trivalent High Dose (Fluad) (Completed)      MAW completed today by CMA. Return in about 4 months (around 03/02/2023) for DM.    Reubin Milan, MD [facility] [place]

## 2022-10-31 NOTE — Progress Notes (Signed)
Subjective:   Hannah [name] is a 67 y.o. female who presents for Medicare Annual (Subsequent) preventive examination.  Visit Complete: In person  Review of Systems    Defer to PCP  Provider: In Office Patient: In Office  Cardiac Risk Factors include: advanced age (>34men, >52 women);diabetes mellitus     Objective:    Today's Vitals   10/31/22 1327 10/31/22 1328  BP: 118/64   Weight: 130 lb (59 kg)   PainSc:  4    Body mass index is 21.63 kg/m.     10/31/2022    1:30 PM 12/29/2021    6:38 AM 07/24/2021    8:31 AM 01/04/2020   10:24 AM 12/29/2018   10:16 AM 07/21/2018    7:10 PM 07/19/2018    8:15 PM  Advanced Directives  Does Patient Have a Medical Advance Directive? No No No No No No No  Would patient like information on creating a medical advance directive? No - Patient declined Yes (MAU/Ambulatory/Procedural Areas - Information given) No - Patient declined Yes (MAU/Ambulatory/Procedural Areas - Information given) Yes (MAU/Ambulatory/Procedural Areas - Information given) No - Patient declined     Current Medications (verified) Outpatient Encounter Medications as of 10/31/2022  Medication Sig   albuterol (VENTOLIN HFA) 108 (90 Base) MCG/ACT inhaler INHALE 2 PUFFS BY MOUTH EVERY 6 HOURS AS NEEDED FOR WHEEZING OR SHORTNESS OF BREATH   alendronate (FOSAMAX) 70 MG tablet TAKE 1 TABLET BY MOUTH WITH A FULL GLASS OF WATER ON AN EMPTY STOMACH EVERY 7 DAYS DO NOT EAT OR DRINK OR RECLINE FOR 30 MINUTES AFTER DOSING   atorvastatin (LIPITOR) 20 MG tablet TAKE 1 TABLET BY MOUTH DAILY   chlorzoxazone (PARAFON) 500 MG tablet Take 1 tablet by mouth 3 (three) times daily as needed.   Flavocoxid 500 MG CAPS Take by oral route twice per day.   fluticasone (FLONASE) 50 MCG/ACT nasal spray SHAKE LIQUID AND USE 2 SPRAYS IN EACH NOSTRIL DAILY   fluticasone-salmeterol (ADVAIR DISKUS) 250-50 MCG/ACT AEPB INHALE 1 PUFF INTO THE LUNGS TWICE DAILY   gabapentin (NEURONTIN) 600 MG tablet Take 1  tablet (600 mg total) by mouth 3 (three) times daily. (Patient taking differently: Take 600 mg by mouth in the morning, at noon, in the evening, and at bedtime. Patient takes 2 qam and 2 qpm)   JANUVIA 100 MG tablet TAKE 1 TABLET(100 MG) BY MOUTH DAILY   Lancets (ONETOUCH DELICA PLUS LANCET30G) MISC Use up to 4 times daily to test blood sugar as needed   metFORMIN (GLUCOPHAGE-XR) 500 MG 24 hr tablet Take 2 tablets (1,000 mg total) by mouth 2 (two) times daily with a meal.   omeprazole (PRILOSEC) 20 MG capsule TAKE 1 CAPSULE(20 MG) BY MOUTH TWICE DAILY BEFORE A MEAL   ONETOUCH ULTRA test strip USE TO TEST BLOOD SUGAR TWICE DAILY   oxyCODONE (OXY IR/ROXICODONE) 5 MG immediate release tablet Take 5 mg by mouth every 4 (four) hours as needed (post op pain).   oxyCODONE-acetaminophen (PERCOCET) 10-325 MG tablet Take 1 tablet by mouth every 4 (four) hours as needed.   pramipexole (MIRAPEX) 0.125 MG tablet Mirapex 0.125 mg tablet  1 to 2 in pm for RLS   tiZANidine (ZANAFLEX) 4 MG tablet Take 4 mg by mouth every 6 (six) hours as needed.    No facility-administered encounter medications on file as of 10/31/2022.    Allergies (verified) Levofloxacin   History: Past Medical History:  Diagnosis Date   Arthritis    Diabetes  mellitus without complication (HCC)    Diet controlled gestational diabetes mellitus in puerperium    Emphysema of lung (HCC)    Familial spastic paraparesis (HCC)    GERD (gastroesophageal reflux disease)    Hyperlipidemia    Mood disorder (HCC) 04/29/2019   Past Surgical History:  Procedure Laterality Date   COLONOSCOPY WITH PROPOFOL N/A 03/05/2016   Procedure: COLONOSCOPY WITH PROPOFOL;  Surgeon: Midge Minium, MD;  Location: [facility];  Service: Endoscopy;  Laterality: N/A;   COLONOSCOPY WITH PROPOFOL N/A 09/18/2021   Procedure: COLONOSCOPY WITH PROPOFOL;  Surgeon: Midge Minium, MD;  Location: [facility];  Service: Endoscopy;  Laterality: N/A;    ESOPHAGEAL DILATION N/A 12/29/2021   Procedure: ESOPHAGEAL DILATION;  Surgeon: Midge Minium, MD;  Location: [facility];  Service: Endoscopy;  Laterality: N/A;   ESOPHAGOGASTRODUODENOSCOPY (EGD) WITH PROPOFOL N/A 12/29/2021   Procedure: ESOPHAGOGASTRODUODENOSCOPY (EGD) WITH PROPOFOL;  Surgeon: Midge Minium, MD;  Location: [facility];  Service: Endoscopy;  Laterality: N/A;  Diabetic   HIP FRACTURE SURGERY Right 03/2013   INNER EAR SURGERY Bilateral    REPLACEMENT TOTAL KNEE Right 02/2015   SHOULDER SURGERY Bilateral    rotator cuff   TOTAL KNEE ARTHROPLASTY Left 06/05/2022   Family History  Problem Relation Age of Onset   Cervical cancer Mother    Colon cancer Mother    Diabetes Father    Heart disease Father    Breast cancer Sister 43   Breast cancer Cousin 94       pat cousin   Diabetes Sister    Diabetes Sister    Congestive Heart Failure Paternal Grandmother    Diabetes Paternal Grandmother    Social History   Socioeconomic History   Marital status: Widowed    Spouse name: Not on file   Number of children: 2   Years of education: Not on file   Highest education level: Some college, no degree  Occupational History   Occupation: retired  Tobacco Use   Smoking status: Former    Current packs/day: 0.00    Average packs/day: 0.5 packs/day for 25.0 years (12.5 ttl pk-yrs)    Types: Cigarettes    Start date: 03/06/1991    Quit date: 03/05/2016    Years since quitting: 6.6   Smokeless tobacco: Never  Vaping Use   Vaping status: Never Used  Substance and Sexual Activity   Alcohol use: No   Drug use: No   Sexual activity: Never  Other Topics Concern   Not on file  Social History Narrative   Pt's son lives with her; he is recovering from TBI due to motorcycle accident 09/10/19   Social Determinants of Health   Financial Resource Strain: Low Risk  (10/31/2022)   Overall Financial Resource Strain (CARDIA)    Difficulty of Paying Living Expenses: Not hard  at all  Food Insecurity: No Food Insecurity (10/31/2022)   Hunger Vital Sign    Worried About Running Out of Food in the Last Year: Never true    Ran Out of Food in the Last Year: Never true  Transportation Needs: No Transportation Needs (10/31/2022)   PRAPARE - Administrator, Civil Service (Medical): No    Lack of Transportation (Non-Medical): No  Physical Activity: Inactive (10/31/2022)   Exercise Vital Sign    Days of Exercise per Week: 0 days    Minutes of Exercise per Session: 0 min  Stress: No Stress Concern Present (10/31/2022)   [employer] of  Occupational Health - Occupational Stress Questionnaire    Feeling of Stress : Not at all  Social Connections: Socially Isolated (07/24/2021)   Social Connection and Isolation Panel [NHANES]    Frequency of Communication with Friends and Family: More than three times a week    Frequency of Social Gatherings with Friends and Family: More than three times a week    Attends Religious Services: Never    Database administrator or Organizations: No    Attends Banker Meetings: Never    Marital Status: Widowed    Tobacco Counseling Counseling given: Not Answered   Clinical Intake:  Pre-visit preparation completed: Yes  Pain : 0-10 Pain Score: 4  Pain Type: Chronic pain Pain Location: Back Pain Orientation: Lower Pain Descriptors / Indicators: Aching, Constant Pain Onset: More than a month ago Pain Frequency: Constant     BMI - recorded: 21.63 Nutritional Status: BMI of 19-24  Normal Nutritional Risks: Other (Comment) Diabetes: Yes CBG done?: No CBG resulted in Enter/ Edit results?: No Did pt. bring in CBG monitor from home?: No  How often do you need to have someone help you when you read instructions, pamphlets, or other written materials from your doctor or pharmacy?: 1 - Never  Interpreter Needed?: No  Information entered by :: Margaretha Sheffield, CMA   Activities of Daily Living     10/31/2022    1:31 PM 12/29/2021    6:28 AM  In your present state of health, do you have any difficulty performing the following activities:  Hearing? 0 0  Vision? 0 0  Difficulty concentrating or making decisions? 0 0  Walking or climbing stairs? 1 0  Dressing or bathing? 0 0  Doing errands, shopping? 0   Preparing Food and eating ? N   Using the Toilet? N   In the past six months, have you accidently leaked urine? N   Do you have problems with loss of bowel control? N   Managing your Medications? N   Managing your Finances? N   Housekeeping or managing your Housekeeping? N     Patient Care Team: Reubin Milan, MD as PCP - General (Internal Medicine) Jordan, Jimmy J, MD as Consulting Physician (Physical Medicine and Rehabilitation) Zenon Mayo, MD as Referring Physician (Orthopedic Surgery) Herschell Dimes, NP as Nurse Practitioner (Family Medicine) [facility] (Optometry)  Indicate any recent Medical Services you may have received from other than [facility] providers in the past year (date may be approximate).     Assessment:   This is a routine wellness examination for Al[place].  Hearing/Vision screen Hearing Screening - Comments:: No concerns at this time. Vision Screening - Comments:: No concerns at this time.   Goals Addressed             This Visit's Progress    DIET - EAT MORE FRUITS AND VEGETABLES   On track    Recommend 3-4 servings of fruits and vegetables per day.      Depression Screen    10/31/2022    1:12 PM 06/27/2022    9:32 AM 04/18/2022    3:26 PM 03/20/2022    8:20 AM 02/16/2022   10:23 AM 10/24/2021    9:27 AM 07/24/2021    8:29 AM  PHQ 2/9 Scores  PHQ - 2 Score 0 3 0 0 3 6 2   PHQ- 9 Score 1 6 0 2 11 14 6     Fall Risk    10/31/2022  1:12 PM 06/27/2022    9:32 AM 04/18/2022    3:26 PM 03/20/2022    8:21 AM 02/16/2022   10:24 AM  Fall Risk   Falls in the past year? 1 1 1 1 1   Number falls in past yr: 1 1 1 1 1   Injury with Fall? 0 0 0  0 0  Risk for fall due to : History of fall(s) History of fall(s);Impaired balance/gait History of fall(s);Impaired balance/gait No Fall Risks No Fall Risks  Follow up Falls evaluation completed Falls evaluation completed Falls evaluation completed Falls evaluation completed Falls evaluation completed    MEDICARE RISK AT HOME: Medicare Risk at Home Any stairs in or around the home?: Yes If so, are there any without handrails?: No Home free of loose throw rugs in walkways, pet beds, electrical cords, etc?: Yes Adequate lighting in your home to reduce risk of falls?: Yes Life alert?: No Use of a cane, walker or w/c?: Yes Grab bars in the bathroom?: Yes Shower chair or bench in shower?: Yes Elevated toilet seat or a handicapped toilet?: Yes  TIMED UP AND GO:  Was the test performed?  Yes  Length of time to ambulate 10 feet: 10 sec Gait slow and steady with assistive device    Cognitive Function:        10/31/2022    1:31 PM 01/04/2020   10:29 AM 12/29/2018   10:23 AM  6CIT Screen  What Year? 0 points 0 points 0 points  What month? 0 points 0 points 0 points  What time? 0 points 0 points 0 points  Count back from 20 0 points 0 points 0 points  Months in reverse 0 points 0 points 0 points  Repeat phrase 2 points 2 points 0 points  Total Score 2 points 2 points 0 points    Immunizations Immunization History  Administered Date(s) Administered   Fluad Quad(high Dose 65+) 02/16/2022   Influenza, Seasonal, Injecte, Preservative Fre 11/29/2011   Influenza,inj,Quad PF,6+ Mos 11/24/2014, 11/25/2015, 12/24/2016, 12/11/2017, 10/29/2018, 11/25/2019   Influenza,inj,quad, With Preservative 03/08/2017   Influenza-Unspecified 11/24/2014, 11/25/2015, 12/24/2016, 12/11/2017, 10/29/2018, 11/25/2019, 10/18/2020   PFIZER(Purple Top)SARS-COV-2 Vaccination 05/18/2019, 06/10/2019   PNEUMOCOCCAL CONJUGATE-20 09/16/2020   Pneumococcal Conjugate-13 11/24/2014   Pneumococcal Polysaccharide-23  05/20/2012   Tdap 11/25/2019    TDAP status: Up to date  Flu Vaccine status: Up to date  Pneumococcal vaccine status: Up to date  Covid-19 vaccine status: Completed vaccines  Qualifies for Shingles Vaccine? Yes   Zostavax completed No   Shingrix Completed?: No.    Education has been provided regarding the importance of this vaccine. Patient has been advised to call insurance company to determine out of pocket expense if they have not yet received this vaccine. Advised may also receive vaccine at local pharmacy or Health Dept. Verbalized acceptance and understanding.  Screening Tests Health Maintenance  Topic Date Due   Zoster Vaccines- Shingrix (1 of 2) Never done   COVID-19 Vaccine (3 - Pfizer risk series) 07/08/2019   INFLUENZA VACCINE  09/20/2022   MAMMOGRAM  10/31/2022   HEMOGLOBIN A1C  04/30/2023   Diabetic kidney evaluation - eGFR measurement  06/27/2023   Diabetic kidney evaluation - Urine ACR  06/27/2023   FOOT EXAM  06/27/2023   OPHTHALMOLOGY EXAM  07/31/2023   Medicare Annual Wellness (AWV)  10/31/2023   Colonoscopy  09/18/2028   DTaP/Tdap/Td (2 - Td or Tdap) 11/24/2029   Pneumonia Vaccine 72+ Years old  Completed   DEXA SCAN  Completed   Hepatitis C Screening  Completed   HPV VACCINES  Aged Out    Health Maintenance  Health Maintenance Due  Topic Date Due   Zoster Vaccines- Shingrix (1 of 2) Never done   COVID-19 Vaccine (3 - Pfizer risk series) 07/08/2019   INFLUENZA VACCINE  09/20/2022   MAMMOGRAM  10/31/2022    Colorectal cancer screening: Type of screening: Colonoscopy. Completed 09/18/2021. Repeat every 7 years  Mammogram status: Completed 10/30/2021. Repeat every year   Lung Cancer Screening: (Low Dose CT Chest recommended if Age 61-80 years, 20 pack-year currently smoking OR have quit w/in 15years.) does not qualify.   Additional Screening:  Hepatitis C Screening: does qualify; Completed 06/23/2021  Vision Screening: Recommended annual  ophthalmology exams for early detection of glaucoma and other disorders of the eye. Is the patient up to date with their annual eye exam?  Yes  Who is the provider or what is the name of the office in which the patient attends annual eye exams? [facility]  Dental Screening: Recommended annual dental exams for proper oral hygiene  Diabetic Foot Exam: Diabetic Foot Exam: Completed 06/27/2022  Community Resource Referral / Chronic Care Management: CRR required this visit?  No   CCM required this visit?  No     Plan:     I have personally reviewed and noted the following in the patient's chart:   Medical and social history Use of alcohol, tobacco or illicit drugs  Current medications and supplements including opioid prescriptions. Patient is not currently taking opioid prescriptions. Functional ability and status Nutritional status Physical activity Advanced directives List of other physicians Hospitalizations, surgeries, and ER visits in previous 12 months Vitals Screenings to include cognitive, depression, and falls Referrals and appointments  In addition, I have reviewed and discussed with patient certain preventive protocols, quality metrics, and best practice recommendations. A written personalized care plan for preventive services as well as general preventive health recommendations were provided to patient.     Mariel Sleet, CMA   10/31/2022   After Visit Summary:  Printed in office  Nurse Notes: None.

## 2022-11-01 ENCOUNTER — Inpatient Hospital Stay: Admission: RE | Admit: 2022-11-01 | Payer: Medicare HMO | Source: Ambulatory Visit

## 2022-11-06 DIAGNOSIS — R252 Cramp and spasm: Secondary | ICD-10-CM | POA: Diagnosis not present

## 2022-11-06 DIAGNOSIS — M791 Myalgia, unspecified site: Secondary | ICD-10-CM | POA: Diagnosis not present

## 2022-11-06 DIAGNOSIS — R202 Paresthesia of skin: Secondary | ICD-10-CM | POA: Diagnosis not present

## 2022-11-06 DIAGNOSIS — M542 Cervicalgia: Secondary | ICD-10-CM | POA: Diagnosis not present

## 2022-11-06 DIAGNOSIS — M79642 Pain in left hand: Secondary | ICD-10-CM | POA: Diagnosis not present

## 2022-11-06 DIAGNOSIS — G2581 Restless legs syndrome: Secondary | ICD-10-CM | POA: Diagnosis not present

## 2022-11-06 DIAGNOSIS — M47812 Spondylosis without myelopathy or radiculopathy, cervical region: Secondary | ICD-10-CM | POA: Diagnosis not present

## 2022-11-06 DIAGNOSIS — G894 Chronic pain syndrome: Secondary | ICD-10-CM | POA: Diagnosis not present

## 2022-11-06 DIAGNOSIS — E1140 Type 2 diabetes mellitus with diabetic neuropathy, unspecified: Secondary | ICD-10-CM | POA: Diagnosis not present

## 2022-11-06 DIAGNOSIS — G8918 Other acute postprocedural pain: Secondary | ICD-10-CM | POA: Diagnosis not present

## 2022-11-06 DIAGNOSIS — Z79899 Other long term (current) drug therapy: Secondary | ICD-10-CM | POA: Diagnosis not present

## 2022-11-06 DIAGNOSIS — M47816 Spondylosis without myelopathy or radiculopathy, lumbar region: Secondary | ICD-10-CM | POA: Diagnosis not present

## 2022-11-14 DIAGNOSIS — M5412 Radiculopathy, cervical region: Secondary | ICD-10-CM | POA: Diagnosis not present

## 2022-11-21 ENCOUNTER — Telehealth: Payer: Self-pay

## 2022-11-21 DIAGNOSIS — M79642 Pain in left hand: Secondary | ICD-10-CM | POA: Diagnosis not present

## 2022-11-21 DIAGNOSIS — G894 Chronic pain syndrome: Secondary | ICD-10-CM | POA: Diagnosis not present

## 2022-11-21 DIAGNOSIS — M47812 Spondylosis without myelopathy or radiculopathy, cervical region: Secondary | ICD-10-CM | POA: Diagnosis not present

## 2022-11-21 DIAGNOSIS — G2581 Restless legs syndrome: Secondary | ICD-10-CM | POA: Diagnosis not present

## 2022-11-21 DIAGNOSIS — R202 Paresthesia of skin: Secondary | ICD-10-CM | POA: Diagnosis not present

## 2022-11-21 DIAGNOSIS — E1140 Type 2 diabetes mellitus with diabetic neuropathy, unspecified: Secondary | ICD-10-CM | POA: Diagnosis not present

## 2022-11-21 DIAGNOSIS — G8918 Other acute postprocedural pain: Secondary | ICD-10-CM | POA: Diagnosis not present

## 2022-11-21 DIAGNOSIS — Z79899 Other long term (current) drug therapy: Secondary | ICD-10-CM | POA: Diagnosis not present

## 2022-11-21 DIAGNOSIS — M48061 Spinal stenosis, lumbar region without neurogenic claudication: Secondary | ICD-10-CM | POA: Diagnosis not present

## 2022-11-21 DIAGNOSIS — M4802 Spinal stenosis, cervical region: Secondary | ICD-10-CM | POA: Diagnosis not present

## 2022-11-21 DIAGNOSIS — M791 Myalgia, unspecified site: Secondary | ICD-10-CM | POA: Diagnosis not present

## 2022-11-21 DIAGNOSIS — M47816 Spondylosis without myelopathy or radiculopathy, lumbar region: Secondary | ICD-10-CM | POA: Diagnosis not present

## 2022-11-21 NOTE — Telephone Encounter (Signed)
Karin Golden, a PA from [employer], called our office to inform Hannah [name] has been seeing them for her knee, spine, and neck pain. They recently did a MRI of the cervical spine and found "changes in c3-c4 that cannot exclude underlying marrow lesion."  Dr Judithann Graves has been informed, and [name] is aware per [place]. [name] is supposed to call to make an appointment to see Dr Judithann Graves. Karin Golden will fax MRI results to our office.  - Hannah [name]

## 2022-11-23 DIAGNOSIS — R600 Localized edema: Secondary | ICD-10-CM | POA: Diagnosis not present

## 2022-11-23 DIAGNOSIS — M4800 Spinal stenosis, site unspecified: Secondary | ICD-10-CM | POA: Diagnosis not present

## 2022-11-23 DIAGNOSIS — G2581 Restless legs syndrome: Secondary | ICD-10-CM | POA: Diagnosis not present

## 2022-11-23 DIAGNOSIS — M1990 Unspecified osteoarthritis, unspecified site: Secondary | ICD-10-CM | POA: Diagnosis not present

## 2022-11-23 DIAGNOSIS — M810 Age-related osteoporosis without current pathological fracture: Secondary | ICD-10-CM | POA: Diagnosis not present

## 2022-11-23 DIAGNOSIS — R32 Unspecified urinary incontinence: Secondary | ICD-10-CM | POA: Diagnosis not present

## 2022-11-23 DIAGNOSIS — E785 Hyperlipidemia, unspecified: Secondary | ICD-10-CM | POA: Diagnosis not present

## 2022-11-23 DIAGNOSIS — M62838 Other muscle spasm: Secondary | ICD-10-CM | POA: Diagnosis not present

## 2022-11-23 DIAGNOSIS — Z008 Encounter for other general examination: Secondary | ICD-10-CM | POA: Diagnosis not present

## 2022-11-23 DIAGNOSIS — K219 Gastro-esophageal reflux disease without esophagitis: Secondary | ICD-10-CM | POA: Diagnosis not present

## 2022-11-23 DIAGNOSIS — I10 Essential (primary) hypertension: Secondary | ICD-10-CM | POA: Diagnosis not present

## 2022-11-23 DIAGNOSIS — E1142 Type 2 diabetes mellitus with diabetic polyneuropathy: Secondary | ICD-10-CM | POA: Diagnosis not present

## 2022-11-23 DIAGNOSIS — M545 Low back pain, unspecified: Secondary | ICD-10-CM | POA: Diagnosis not present

## 2022-11-26 DIAGNOSIS — M48061 Spinal stenosis, lumbar region without neurogenic claudication: Secondary | ICD-10-CM | POA: Diagnosis not present

## 2022-11-29 DIAGNOSIS — M4186 Other forms of scoliosis, lumbar region: Secondary | ICD-10-CM | POA: Diagnosis not present

## 2022-12-03 DIAGNOSIS — G894 Chronic pain syndrome: Secondary | ICD-10-CM | POA: Diagnosis not present

## 2022-12-03 DIAGNOSIS — M48061 Spinal stenosis, lumbar region without neurogenic claudication: Secondary | ICD-10-CM | POA: Diagnosis not present

## 2022-12-03 DIAGNOSIS — G8918 Other acute postprocedural pain: Secondary | ICD-10-CM | POA: Diagnosis not present

## 2022-12-03 DIAGNOSIS — R202 Paresthesia of skin: Secondary | ICD-10-CM | POA: Diagnosis not present

## 2022-12-03 DIAGNOSIS — E1140 Type 2 diabetes mellitus with diabetic neuropathy, unspecified: Secondary | ICD-10-CM | POA: Diagnosis not present

## 2022-12-03 DIAGNOSIS — Z79899 Other long term (current) drug therapy: Secondary | ICD-10-CM | POA: Diagnosis not present

## 2022-12-03 DIAGNOSIS — M791 Myalgia, unspecified site: Secondary | ICD-10-CM | POA: Diagnosis not present

## 2022-12-03 DIAGNOSIS — M79642 Pain in left hand: Secondary | ICD-10-CM | POA: Diagnosis not present

## 2022-12-03 DIAGNOSIS — M47816 Spondylosis without myelopathy or radiculopathy, lumbar region: Secondary | ICD-10-CM | POA: Diagnosis not present

## 2022-12-03 DIAGNOSIS — G2581 Restless legs syndrome: Secondary | ICD-10-CM | POA: Diagnosis not present

## 2022-12-03 DIAGNOSIS — M4802 Spinal stenosis, cervical region: Secondary | ICD-10-CM | POA: Diagnosis not present

## 2022-12-03 DIAGNOSIS — M47812 Spondylosis without myelopathy or radiculopathy, cervical region: Secondary | ICD-10-CM | POA: Diagnosis not present

## 2022-12-28 ENCOUNTER — Other Ambulatory Visit: Payer: Self-pay

## 2022-12-28 ENCOUNTER — Ambulatory Visit: Payer: Self-pay | Admitting: *Deleted

## 2022-12-28 ENCOUNTER — Ambulatory Visit: Admitting: Family Medicine

## 2022-12-28 DIAGNOSIS — J439 Emphysema, unspecified: Secondary | ICD-10-CM

## 2022-12-28 MED ORDER — ALBUTEROL SULFATE HFA 108 (90 BASE) MCG/ACT IN AERS: 2.0000 | INHALATION_SPRAY | RESPIRATORY_TRACT | 0 refills | Status: DC | PRN

## 2022-12-28 NOTE — Telephone Encounter (Signed)
  Chief Complaint: cough, SOB Symptoms: patient states she has cough- out of her inhaler, possible rib injury from fall Frequency: patient states she fell and injured her rib- but she also has cough and has been without her inhaler for her COPD for several months.   Disposition: [] ED /[] Urgent Care (no appt availability in office) / [x] Appointment(In office/virtual)/ []  [facility]/ [] Home Care/ [] Refused Recommended Disposition /[] [facility]/ []  Follow-up with PCP Additional Notes: Patient wanted Monday appointment- stress improtance of evaluation of symptoms today- can request inhaler- but she needs evaluation- she agrees to come to office

## 2022-12-28 NOTE — Telephone Encounter (Signed)
Reason for Disposition  [1] Longstanding difficulty breathing (e.g., CHF, COPD, emphysema) AND [2] WORSE than normal  Answer Assessment - Initial Assessment Questions 1. RESPIRATORY STATUS: "Describe your breathing?" (e.g., wheezing, shortness of breath, unable to speak, severe coughing)      Wheezing- yard work- dust has set her off 2. ONSET: "When did this breathing problem begin?"      today 3. PATTERN "Does the difficult breathing come and go, or has it been constant since it started?"      Comes/goes- but it worse 4. SEVERITY: "How bad is your breathing?" (e.g., mild, moderate, severe)    - MILD: No SOB at rest, mild SOB with walking, speaks normally in sentences, can lie down, no retractions, pulse < 100.    - MODERATE: SOB at rest, SOB with minimal exertion and prefers to sit, cannot lie down flat, speaks in phrases, mild retractions, audible wheezing, pulse 100-120.    - SEVERE: Very SOB at rest, speaks in single words, struggling to breathe, sitting hunched forward, retractions, pulse > 120      moderate 5. RECURRENT SYMPTOM: "Have you had difficulty breathing before?" If Yes, ask: "When was the last time?" and "What happened that time?"      Yes- COPD 6. CARDIAC HISTORY: "Do you have any history of heart disease?" (e.g., heart attack, angina, bypass surgery, angioplasty)      Hx heart disease 7. LUNG HISTORY: "Do you have any history of lung disease?"  (e.g., pulmonary embolus, asthma, emphysema)     COPD, cough- 2 months 8. CAUSE: "What do you think is causing the breathing problem?"      Yard work 9. OTHER SYMPTOMS: "Do you have any other symptoms? (e.g., dizziness, runny nose, cough, chest pain, fever)     cough  Protocols used: Breathing Difficulty-A-AH

## 2023-01-07 DIAGNOSIS — M47817 Spondylosis without myelopathy or radiculopathy, lumbosacral region: Secondary | ICD-10-CM | POA: Diagnosis not present

## 2023-01-11 ENCOUNTER — Other Ambulatory Visit: Payer: Self-pay | Admitting: Internal Medicine

## 2023-01-11 DIAGNOSIS — J439 Emphysema, unspecified: Secondary | ICD-10-CM

## 2023-01-11 NOTE — Telephone Encounter (Signed)
Requested too soon Requested Prescriptions  Pending Prescriptions Disp Refills   albuterol (VENTOLIN HFA) 108 (90 Base) MCG/ACT inhaler [Pharmacy Med Name: ALBUTEROL HFA INH (200 PUFFS) 18GM] 18 g 0    Sig: INHALE 2 PUFFS INTO THE LUNGS EVERY 4 HOURS AS NEEDED FOR WHEEZING OR SHORTNESS OF BREATH     Pulmonology:  Beta Agonists 2 Passed - 01/11/2023  3:33 AM      Passed - Last BP in normal range    BP Readings from Last 1 Encounters:  10/31/22 118/64         Passed - Last Heart Rate in normal range    Pulse Readings from Last 1 Encounters:  10/31/22 73         Passed - Valid encounter within last 12 months    Recent Outpatient Visits           2 months ago Type II diabetes mellitus with complication (HCC)   [place] [facility], Nyoka Cowden, MD   6 months ago Annual physical exam   [facility] at [facility], Nyoka Cowden, MD   8 months ago Type II diabetes mellitus with complication [facility])   [place] [facility] at [facility], Nyoka Cowden, MD   9 months ago Type II diabetes mellitus with complication [facility])   [place] [facility] at [facility], Nyoka Cowden, MD   10 months ago Type II diabetes mellitus with complication [facility])   [place] [facility] at [facility], Nyoka Cowden, MD       Future Appointments             In 1 month Judithann Graves, Nyoka Cowden, MD [facility] at [facility], [facility]

## 2023-01-12 ENCOUNTER — Other Ambulatory Visit: Payer: Self-pay | Admitting: Internal Medicine

## 2023-01-12 DIAGNOSIS — E1169 Type 2 diabetes mellitus with other specified complication: Secondary | ICD-10-CM

## 2023-01-14 NOTE — Telephone Encounter (Signed)
Requested Prescriptions  Refused Prescriptions Disp Refills   atorvastatin (LIPITOR) 20 MG tablet [Pharmacy Med Name: ATORVASTATIN 20MG  TABLETS] 90 tablet 1    Sig: TAKE 1 TABLET BY MOUTH DAILY     Cardiovascular:  Antilipid - Statins Failed - 01/12/2023  6:20 AM      Failed - Lipid Panel in normal range within the last 12 months    Cholesterol, Total  Date Value Ref Range Status  06/27/2022 153 100 - 199 mg/dL Final   LDL Chol Calc (NIH)  Date Value Ref Range Status  06/27/2022 79 0 - 99 mg/dL Final   HDL  Date Value Ref Range Status  06/27/2022 54 >39 mg/dL Final   Triglycerides  Date Value Ref Range Status  06/27/2022 112 0 - 149 mg/dL Final         Passed - Patient is not pregnant      Passed - Valid encounter within last 12 months    Recent Outpatient Visits           2 months ago Type II diabetes mellitus with complication (HCC)   [place] [facility], Nyoka Cowden, MD   6 months ago Annual physical exam   [facility] at [facility], Nyoka Cowden, MD   9 months ago Type II diabetes mellitus with complication [facility])   [place] [facility] at [facility], Nyoka Cowden, MD   10 months ago Type II diabetes mellitus with complication [facility])   [place] [facility] at [facility], Nyoka Cowden, MD   11 months ago Type II diabetes mellitus with complication [facility])   [place] [facility] at [facility], Nyoka Cowden, MD       Future Appointments             In 1 month Judithann Graves, Nyoka Cowden, MD [facility] at [facility], [facility]

## 2023-01-18 ENCOUNTER — Other Ambulatory Visit: Payer: Self-pay | Admitting: Internal Medicine

## 2023-01-18 DIAGNOSIS — E118 Type 2 diabetes mellitus with unspecified complications: Secondary | ICD-10-CM

## 2023-01-22 NOTE — Telephone Encounter (Signed)
Requested Prescriptions  Pending Prescriptions Disp Refills   metFORMIN (GLUCOPHAGE-XR) 500 MG 24 hr tablet [Pharmacy Med Name: METFORMIN ER 500MG  24HR TABS] 360 tablet 3    Sig: TAKE 2 TABLETS(1000 MG) BY MOUTH TWICE DAILY WITH A MEAL     Endocrinology:  Diabetes - Biguanides Failed - 01/18/2023  1:27 PM      Failed - Cr in normal range and within 360 days    Creatinine  Date Value Ref Range Status  03/31/2013 0.62 0.60 - 1.30 mg/dL Final   Creatinine, Ser  Date Value Ref Range Status  06/27/2022 0.44 (L) 0.57 - 1.00 mg/dL Final         Failed - B12 Level in normal range and within 720 days    No results found for: "VITAMINB12"       Passed - HBA1C is between 0 and 7.9 and within 180 days    Hemoglobin A1C  Date Value Ref Range Status  10/31/2022 6.9 (A) 4.0 - 5.6 % Final  12/19/2020 6.6  Final   Hgb A1c MFr Bld  Date Value Ref Range Status  06/27/2022 6.0 (H) 4.8 - 5.6 % Final    Comment:             Prediabetes: 5.7 - 6.4          Diabetes: >6.4          Glycemic control for adults with diabetes: <7.0          Passed - eGFR in normal range and within 360 days    EGFR (African American)  Date Value Ref Range Status  03/31/2013 >60  Final   GFR calc Af Amer  Date Value Ref Range Status  11/25/2019 113 >59 mL/min/1.73 Final    Comment:    **[facility] currently reports eGFR in compliance with the current**   recommendations of the [employer]. [facility] will   update reporting as new guidelines are published from the NKF-ASN   Task force.    EGFR (Non-African Amer.)  Date Value Ref Range Status  03/31/2013 >60  Final    Comment:    eGFR values <73mL/min/1.73 m2 may be an indication of chronic kidney disease (CKD). Calculated eGFR is useful in patients with stable renal function. The eGFR calculation will not be reliable in acutely ill patients when serum creatinine is changing rapidly. It is not useful in  patients on dialysis. The eGFR  calculation may not be applicable to patients at the low and high extremes of body sizes, pregnant women, and vegetarians.    GFR calc non Af Amer  Date Value Ref Range Status  11/25/2019 98 >59 mL/min/1.73 Final   eGFR  Date Value Ref Range Status  06/27/2022 107 >59 mL/min/1.73 Final         Passed - Valid encounter within last 6 months    Recent Outpatient Visits           2 months ago Type II diabetes mellitus with complication [facility])   [place] [facility] at [facility], Nyoka Cowden, MD   6 months ago Annual physical exam   [facility] at [facility], Nyoka Cowden, MD   9 months ago Type II diabetes mellitus with complication [facility])   [place] [facility] Medicine at [facility], Nyoka Cowden, MD   10 months ago Type II diabetes mellitus with complication [facility])     Primary Care & Sports Medicine at [facility], Nyoka Cowden, MD   11 months ago Type II diabetes mellitus with complication [facility])   [place] [facility] Medicine at [facility], Nyoka Cowden, MD       Future Appointments             In 1 month Judithann Graves, Nyoka Cowden, MD [facility] Care [facility] Medicine at [facility]            Passed - CBC within normal limits and completed in the last 12 months    WBC  Date Value Ref Range Status  06/27/2022 10.2 3.4 - 10.8 x10E3/uL Final  07/21/2018 11.7 (H) 4.0 - 10.5 K/uL Final   RBC  Date Value Ref Range Status  06/27/2022 4.29 3.77 - 5.28 x10E6/uL Final  07/21/2018 4.63 3.87 - 5.11 MIL/uL Final   Hemoglobin  Date Value Ref Range Status  06/27/2022 13.0 11.1 - 15.9 g/dL Final   Hematocrit  Date Value Ref Range Status  06/27/2022 40.2 34.0 - 46.6 % Final   MCHC  Date Value Ref Range Status  06/27/2022 32.3 31.5 - 35.7 g/dL Final  13/09/6576 46.9 30.0 - 36.0 g/dL Final   [facility]  Date Value Ref Range Status   06/27/2022 30.3 26.6 - 33.0 pg Final  07/21/2018 31.7 26.0 - 34.0 pg Final   MCV  Date Value Ref Range Status  06/27/2022 94 79 - 97 fL Final  03/31/2013 96 80 - 100 fL Final   No results found for: "PLTCOUNTKUC", "LABPLAT", "POCPLA" RDW  Date Value Ref Range Status  06/27/2022 12.3 11.7 - 15.4 % Final  03/31/2013 12.8 11.5 - 14.5 % Final

## 2023-01-31 DIAGNOSIS — M48061 Spinal stenosis, lumbar region without neurogenic claudication: Secondary | ICD-10-CM | POA: Diagnosis not present

## 2023-01-31 DIAGNOSIS — M791 Myalgia, unspecified site: Secondary | ICD-10-CM | POA: Diagnosis not present

## 2023-01-31 DIAGNOSIS — G8918 Other acute postprocedural pain: Secondary | ICD-10-CM | POA: Diagnosis not present

## 2023-01-31 DIAGNOSIS — E1140 Type 2 diabetes mellitus with diabetic neuropathy, unspecified: Secondary | ICD-10-CM | POA: Diagnosis not present

## 2023-01-31 DIAGNOSIS — R202 Paresthesia of skin: Secondary | ICD-10-CM | POA: Diagnosis not present

## 2023-01-31 DIAGNOSIS — G2581 Restless legs syndrome: Secondary | ICD-10-CM | POA: Diagnosis not present

## 2023-01-31 DIAGNOSIS — M79642 Pain in left hand: Secondary | ICD-10-CM | POA: Diagnosis not present

## 2023-01-31 DIAGNOSIS — M4802 Spinal stenosis, cervical region: Secondary | ICD-10-CM | POA: Diagnosis not present

## 2023-01-31 DIAGNOSIS — G894 Chronic pain syndrome: Secondary | ICD-10-CM | POA: Diagnosis not present

## 2023-01-31 DIAGNOSIS — Z79899 Other long term (current) drug therapy: Secondary | ICD-10-CM | POA: Diagnosis not present

## 2023-01-31 DIAGNOSIS — M47812 Spondylosis without myelopathy or radiculopathy, cervical region: Secondary | ICD-10-CM | POA: Diagnosis not present

## 2023-01-31 DIAGNOSIS — M47816 Spondylosis without myelopathy or radiculopathy, lumbar region: Secondary | ICD-10-CM | POA: Diagnosis not present

## 2023-02-26 ENCOUNTER — Other Ambulatory Visit: Payer: Self-pay | Admitting: Internal Medicine

## 2023-02-26 DIAGNOSIS — J3089 Other allergic rhinitis: Secondary | ICD-10-CM

## 2023-02-26 DIAGNOSIS — J439 Emphysema, unspecified: Secondary | ICD-10-CM

## 2023-02-26 DIAGNOSIS — E118 Type 2 diabetes mellitus with unspecified complications: Secondary | ICD-10-CM

## 2023-03-01 NOTE — Telephone Encounter (Signed)
 Requested Prescriptions  Pending Prescriptions Disp Refills   fluticasone -salmeterol (WIXELA INHUB) 250-50 MCG/ACT AEPB [Pharmacy Med Name: WIXELA INHUB DISKUS 250/50MCG 60S] 60 each 2    Sig: INHALE 1 PUFF INTO THE LUNGS TWICE DAILY     Pulmonology:  Combination Products Passed - 03/01/2023  7:56 AM      Passed - Valid encounter within last 12 months    Recent Outpatient Visits           4 months ago Type II diabetes mellitus with complication [facility])   [place] [facility] Medicine at [facility], Leita DEL, MD   8 months ago Annual physical exam   [facility] at [facility], Leita DEL, MD   10 months ago Type II diabetes mellitus with complication [facility])   [place] Primary Care & Sports Medicine at [facility], Leita DEL, MD   11 months ago Type II diabetes mellitus with complication [facility])   [place] [facility] Medicine at [facility], Leita DEL, MD   1 year ago Type II diabetes mellitus with complication [facility])   [place] [facility] Care [facility] Sports Medicine at [facility], Leita DEL, MD       Future Appointments             In 3 days Justus Leita DEL, MD [facility] Care & [facility] Medicine at [facility], [facility]             fluticasone  (FLONASE ) 50 MCG/ACT nasal spray [Pharmacy Med Name: FLUTICASONE  NASAL SP (120) RX] 16 g 1    Sig: SHAKE LIQUID AND USE 2 SPRAYS IN EACH NOSTRIL DAILY     Ear, Nose, and Throat: Nasal Preparations - Corticosteroids Passed - 03/01/2023  7:56 AM      Passed - Valid encounter within last 12 months    Recent Outpatient Visits           4 months ago Type II diabetes mellitus with complication [facility])   [place] [facility] Care & Sports Medicine at [facility], Leita DEL, MD   8 months ago Annual physical exam   [facility] Primary Care & Sports Medicine at [facility], Leita DEL, MD   10  months ago Type II diabetes mellitus with complication [facility])   [place] [facility] Care [facility] Sports [facility] at [facility], Leita DEL, MD   11 months ago Type II diabetes mellitus with complication [facility])   [place] [facility] Care & Sports Medicine at [facility], Leita DEL, MD   1 year ago Type II diabetes mellitus with complication [facility])   [place] [facility] Care & Sports Medicine at [facility], Leita DEL, MD       Future Appointments             In 3 days Justus Leita DEL, MD [facility] Care [facility] at [facility], [facility]             glucose blood (ONETOUCH ULTRA) test strip [facility] Name: ONE TOUCH ULTRA BLUE TESTST(NEW)100] 200 strip 1    Sig: USE TO TEST BLOOD SUGAR TWICE DAILY     Endocrinology: Diabetes - Testing Supplies Passed - 03/01/2023  7:56 AM      Passed - Valid encounter within last 12 months    Recent Outpatient Visits  4 months ago Type II diabetes mellitus with complication [facility])   [place] [facility] Care [facility] Sports Medicine at [facility], Leita DEL, MD   8 months ago Annual physical exam   [facility] at [facility], Leita DEL, MD   10 months ago Type II diabetes mellitus with complication [facility])   [place] [facility] Care [facility] Medicine at [facility], Leita DEL, MD   11 months ago Type II diabetes mellitus with complication [facility])   [place] [facility] Care [facility] Sports Medicine at [facility], Leita DEL, MD   1 year ago Type II diabetes mellitus with complication [facility])   [place] [facility] Care & Sports Medicine at [facility], Leita DEL, MD       Future Appointments             In 3 days Justus Leita DEL, MD [facility] Primary Care & Sports Medicine at [facility], [facility]

## 2023-03-04 ENCOUNTER — Encounter: Payer: Self-pay | Admitting: Internal Medicine

## 2023-03-04 ENCOUNTER — Ambulatory Visit (INDEPENDENT_AMBULATORY_CARE_PROVIDER_SITE_OTHER): Payer: Medicare HMO | Admitting: Internal Medicine

## 2023-03-04 VITALS — BP 120/70 | HR 74 | Ht 65.0 in | Wt 124.0 lb

## 2023-03-04 DIAGNOSIS — G114 Hereditary spastic paraplegia: Secondary | ICD-10-CM | POA: Diagnosis not present

## 2023-03-04 DIAGNOSIS — Z1231 Encounter for screening mammogram for malignant neoplasm of breast: Secondary | ICD-10-CM | POA: Diagnosis not present

## 2023-03-04 DIAGNOSIS — E118 Type 2 diabetes mellitus with unspecified complications: Secondary | ICD-10-CM

## 2023-03-04 DIAGNOSIS — E785 Hyperlipidemia, unspecified: Secondary | ICD-10-CM | POA: Diagnosis not present

## 2023-03-04 DIAGNOSIS — E1169 Type 2 diabetes mellitus with other specified complication: Secondary | ICD-10-CM | POA: Diagnosis not present

## 2023-03-04 DIAGNOSIS — Z7984 Long term (current) use of oral hypoglycemic drugs: Secondary | ICD-10-CM

## 2023-03-04 DIAGNOSIS — R1310 Dysphagia, unspecified: Secondary | ICD-10-CM | POA: Diagnosis not present

## 2023-03-04 LAB — POCT GLYCOSYLATED HEMOGLOBIN (HGB A1C): Hemoglobin A1C: 7.6 % — AB (ref 4.0–5.6)

## 2023-03-04 MED ORDER — ATORVASTATIN CALCIUM 20 MG PO TABS: 20.0000 mg | ORAL_TABLET | Freq: Every day | ORAL | 1 refills | Status: DC

## 2023-03-04 MED ORDER — OMEPRAZOLE 20 MG PO CPDR: 20.0000 mg | DELAYED_RELEASE_CAPSULE | Freq: Two times a day (BID) | ORAL | 1 refills | Status: DC

## 2023-03-04 NOTE — Patient Instructions (Addendum)
 Call [facility] to schedule your mammogram at [phone].  Add glimepiride 1/2 tablet with supper every day.  Monitor blood sugars and low blood sugar symptoms.

## 2023-03-04 NOTE — Progress Notes (Signed)
 Date:  03/04/2023   Name:  Hannah [name]   DOB:  04-Aug-1955   MRN:  [id number]   Chief Complaint: Diabetes  Diabetes She presents for her follow-up diabetic visit. She has type 2 diabetes mellitus. Her disease course has been stable. Pertinent negatives for hypoglycemia include no headaches, nervousness/anxiousness or tremors. Pertinent negatives for diabetes include no chest pain, no fatigue, no polydipsia and no polyuria. Current diabetic treatment includes oral agent (dual therapy) (Januvia  and MTF).    Review of Systems  Constitutional:  Negative for appetite change, fatigue, fever and unexpected weight change.  HENT:  Negative for tinnitus and trouble swallowing.   Eyes:  Negative for visual disturbance.  Respiratory:  Negative for cough, chest tightness and shortness of breath.   Cardiovascular:  Negative for chest pain, palpitations and leg swelling.  Gastrointestinal:  Negative for abdominal pain.  Endocrine: Negative for polydipsia and polyuria.  Genitourinary:  Negative for dysuria and hematuria.  Musculoskeletal:  Positive for arthralgias and gait problem.  Neurological:  Negative for tremors, numbness and headaches.  Psychiatric/Behavioral:  Negative for dysphoric mood and sleep disturbance. The patient is not nervous/anxious.      Lab Results  Component Value Date   NA 139 06/27/2022   K 4.1 06/27/2022   CO2 22 06/27/2022   GLUCOSE 86 06/27/2022   BUN 16 06/27/2022   CREATININE 0.44 (L) 06/27/2022   CALCIUM  9.7 06/27/2022   EGFR 107 06/27/2022   GFRNONAA 98 11/25/2019   Lab Results  Component Value Date   CHOL 153 06/27/2022   HDL 54 06/27/2022   LDLCALC 79 06/27/2022   TRIG 112 06/27/2022   CHOLHDL 2.8 06/27/2022   Lab Results  Component Value Date   TSH 0.898 06/27/2022   Lab Results  Component Value Date   HGBA1C 7.6 (A) 03/04/2023   Lab Results  Component Value Date   WBC 10.2 06/27/2022   HGB 13.0 06/27/2022   HCT 40.2 06/27/2022   MCV  94 06/27/2022   PLT 406 06/27/2022   Lab Results  Component Value Date   ALT 12 06/27/2022   AST 19 06/27/2022   ALKPHOS 68 06/27/2022   BILITOT 0.3 06/27/2022   No results found for: MARIEN BOLLS, VD25OH   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   Tobacco use disorder, mild, 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   Degenerative joint disease involving multiple joints 09/28/2013    Allergies  Allergen Reactions   Levofloxacin  Nausea And Vomiting    Past Surgical History:  Procedure Laterality Date   COLONOSCOPY WITH PROPOFOL  N/A 03/05/2016   Procedure: COLONOSCOPY WITH PROPOFOL ;  Surgeon: Rogelia Copping, MD;  Location: [facility];  Service: Endoscopy;  Laterality: N/A;   COLONOSCOPY WITH PROPOFOL  N/A 09/18/2021   Procedure: COLONOSCOPY WITH PROPOFOL ;  Surgeon: Copping Rogelia, MD;  Location[facility];  Service: Endoscopy;  Laterality: N/A;   ESOPHAGEAL DILATION N/A 12/29/2021   Procedure: ESOPHAGEAL DILATION;  Surgeon: Copping Rogelia, MD;  Location[facility];  Service: Endoscopy;  Laterality: N/A;   ESOPHAGOGASTRODUODENOSCOPY (EGD) WITH PROPOFOL  N/A 12/29/2021   Procedure: ESOPHAGOGASTRODUODENOSCOPY (EGD) WITH PROPOFOL ;  Surgeon: Jinny Carmine, MD;  Location: [facility];  Service: Endoscopy;  Laterality: N/A;  Diabetic   HIP FRACTURE SURGERY Right 03/2013   INNER EAR SURGERY Bilateral    REPLACEMENT TOTAL KNEE Right 02/2015   SHOULDER  SURGERY Bilateral    rotator cuff   TOTAL KNEE ARTHROPLASTY Left 06/05/2022    Social History   Tobacco Use   Smoking status: Former    Current packs/day: 0.00    Average packs/day: 0.5 packs/day for 25.0 years (12.5 ttl pk-yrs)    Types: Cigarettes    Start date: 03/06/1991    Quit date: 03/05/2016    Years since quitting: 7.0   Smokeless tobacco: Never  Vaping Use   Vaping status: Never Used  Substance Use Topics   Alcohol use: No   Drug use: No     Medication list has been reviewed and updated.  Current Meds  Medication Sig   albuterol  (VENTOLIN  HFA) 108 (90 Base) MCG/ACT inhaler Inhale 2 puffs into the lungs every 4 (four) hours as needed for wheezing or shortness of breath.   alendronate  (FOSAMAX ) 70 MG tablet TAKE 1 TABLET BY MOUTH WITH A FULL GLASS OF WATER  ON AN EMPTY STOMACH EVERY 7 DAYS DO NOT EAT OR DRINK OR RECLINE FOR 30 MINUTES AFTER DOSING   chlorzoxazone (PARAFON) 500 MG tablet Take 1 tablet by mouth 3 (three) times daily as needed.   Flavocoxid 500 MG CAPS Take by oral route twice per day.   fluticasone  (FLONASE ) 50 MCG/ACT nasal spray SHAKE LIQUID AND USE 2 SPRAYS IN EACH NOSTRIL DAILY   fluticasone -salmeterol (WIXELA INHUB) 250-50 MCG/ACT AEPB INHALE 1 PUFF INTO THE LUNGS TWICE DAILY   gabapentin  (NEURONTIN ) 600 MG tablet Take 1 tablet (600 mg total) by mouth 3 (three) times daily. (Patient taking differently: Take 600 mg by mouth in the morning, at noon, in the evening, and at bedtime. Patient takes 2 qam and 2 qpm)   glimepiride  (AMARYL ) 2 MG tablet Take 2 mg by mouth daily with breakfast.   glucose blood (ONETOUCH ULTRA) test strip USE TO TEST BLOOD SUGAR TWICE DAILY   JANUVIA  100 MG tablet TAKE 1 TABLET(100 MG) BY MOUTH DAILY   Lancets (ONETOUCH DELICA PLUS LANCET30G) MISC Use up to 4 times daily to test blood sugar as needed   metFORMIN  (GLUCOPHAGE -XR) 500 MG 24 hr tablet TAKE 2 TABLETS(1000 MG) BY MOUTH TWICE DAILY WITH A MEAL   oxyCODONE  (OXY  IR/ROXICODONE ) 5 MG immediate release tablet Take 5 mg by mouth every 4 (four) hours as needed (post op pain).   oxyCODONE -acetaminophen  (PERCOCET) 10-325 MG tablet Take 1 tablet by mouth every 4 (four) hours as needed.   pramipexole (MIRAPEX) 0.125 MG tablet Mirapex 0.125 mg tablet  1 to 2 in pm for RLS   tiZANidine (ZANAFLEX) 4 MG tablet Take 4 mg by mouth every 6 (six) hours as needed.    [DISCONTINUED] atorvastatin  (LIPITOR) 20 MG tablet TAKE 1 TABLET BY MOUTH DAILY   [DISCONTINUED] omeprazole  (PRILOSEC ) 20 MG capsule TAKE 1 CAPSULE(20 MG) BY MOUTH TWICE DAILY BEFORE A MEAL       03/04/2023    1:52 PM 10/31/2022    1:12 PM 06/27/2022    9:33 AM 04/18/2022    3:26 PM  GAD 7 : Generalized Anxiety Score  Nervous, Anxious, on Edge 0 0 1 0  Control/stop worrying 0 0 1  0  Worry too much - different things 0 0 1 0  Trouble relaxing 0 0 1 0  Restless 0 0 0 0  Easily annoyed or irritable 0 0 1 0  Afraid - awful might happen 0 0 1 0  Total GAD 7 Score 0 0 6 0  Anxiety Difficulty Not difficult at all Not difficult at all Somewhat difficult Not difficult at all       03/04/2023    1:52 PM 10/31/2022    1:12 PM 06/27/2022    9:32 AM  Depression screen PHQ 2/9  Decreased Interest 0 0 0  Down, Depressed, Hopeless 0 0 3  PHQ - 2 Score 0 0 3  Altered sleeping 0 0 2  Tired, decreased energy 0 0 0  Change in appetite 0 1 0  Feeling bad or failure about yourself  0 0 1  Trouble concentrating 0 0 0  Moving slowly or fidgety/restless 0 0 0  Suicidal thoughts 0 0 0  PHQ-9 Score 0 1 6  Difficult doing work/chores Not difficult at all Not difficult at all Somewhat difficult    BP Readings from Last 3 Encounters:  03/04/23 120/70  10/31/22 118/64  10/31/22 118/64    Physical Exam Vitals and nursing note reviewed.  Constitutional:      General: She is not in acute distress.    Appearance: Normal appearance. She is well-developed.  HENT:     Head: Normocephalic and atraumatic.   Cardiovascular:     Rate and Rhythm: Normal rate and regular rhythm.     Heart sounds: No murmur heard. Pulmonary:     Effort: Pulmonary effort is normal. No respiratory distress.     Breath sounds: Normal breath sounds and air entry. No decreased breath sounds, wheezing or rhonchi.  Musculoskeletal:     Cervical back: Normal range of motion.     Right lower leg: No edema.     Left lower leg: No edema.  Lymphadenopathy:     Cervical: No cervical adenopathy.  Skin:    General: Skin is warm and dry.     Findings: No rash.  Neurological:     Mental Status: She is alert and oriented to person, place, and time.     Gait: Gait abnormal.  Psychiatric:        Mood and Affect: Mood normal.        Behavior: Behavior normal.     Wt Readings from Last 3 Encounters:  03/04/23 124 lb (56.2 kg)  10/31/22 130 lb (59 kg)  10/31/22 130 lb (59 kg)    BP 120/70   Pulse 74   Ht 5' 5 (1.651 m)   Wt 124 lb (56.2 kg)   SpO2 96%   BMI 20.63 kg/m   Assessment and Plan:  Problem List Items Addressed This Visit       Unprioritized   Dysphagia (Chronic)   Relevant Medications   omeprazole  (PRILOSEC ) 20 MG capsule   Type II diabetes mellitus with complication (HCC) - Primary (Chronic)   Blood sugars stable with rare hypoglycemic symptoms or events. Currently managed with MTF and Januvia .  Changes made last visit are none. Lab Results  Component Value Date   HGBA1C 6.9 (A) 10/31/2022  A1C today = 7.6.  Will resume glimepiride  1 mg with supper daily.      Relevant Medications   glimepiride  (AMARYL ) 2 MG tablet   atorvastatin  (LIPITOR) 20 MG tablet   Other Relevant Orders   POCT  glycosylated hemoglobin (Hb A1C) (Completed)   Hyperlipidemia associated with type 2 diabetes mellitus (HCC) (Chronic)   Relevant Medications   glimepiride  (AMARYL ) 2 MG tablet   atorvastatin  (LIPITOR) 20 MG tablet   Autosomal dominant hereditary spastic paraplegia (HCC)   No change in status other than  worsening knee pain after cleaning out her cabinets this fall. Using Rolator; no recent falls Recommend follow up with Orthopedics      Other Visit Diagnoses       Encounter for screening mammogram for breast cancer       patient [facility]     Long term current use of oral hypoglycemic drug           Return in about 4 months (around 07/02/2023) for CPX.    Leita HILARIO Adie, MD [facility] Primary Care and Sports Medicine [place]

## 2023-03-04 NOTE — Assessment & Plan Note (Addendum)
 Blood sugars stable with rare hypoglycemic symptoms or events. Currently managed with MTF and Januvia .  Changes made last visit are none. Lab Results  Component Value Date   HGBA1C 6.9 (A) 10/31/2022  A1C today = 7.6.  Will resume glimepiride  1 mg with supper daily.

## 2023-03-04 NOTE — Assessment & Plan Note (Signed)
 No change in status other than worsening knee pain after cleaning out her cabinets this fall. Using Rolator; no recent falls Recommend follow up with Orthopedics

## 2023-03-18 ENCOUNTER — Ambulatory Visit
Admission: RE | Admit: 2023-03-18 | Discharge: 2023-03-18 | Disposition: A | Discharge status: Home / Self Care | Payer: Medicare HMO | Source: Ambulatory Visit | Attending: Internal Medicine | Admitting: Internal Medicine

## 2023-03-18 DIAGNOSIS — Z1231 Encounter for screening mammogram for malignant neoplasm of breast: Secondary | ICD-10-CM | POA: Diagnosis not present

## 2023-04-16 ENCOUNTER — Other Ambulatory Visit: Payer: Self-pay | Admitting: Internal Medicine

## 2023-04-16 DIAGNOSIS — E118 Type 2 diabetes mellitus with unspecified complications: Secondary | ICD-10-CM

## 2023-04-17 NOTE — Telephone Encounter (Signed)
 Requested Prescriptions  Pending Prescriptions Disp Refills   metFORMIN (GLUCOPHAGE-XR) 500 MG 24 hr tablet [Pharmacy Med Name: METFORMIN ER 500MG  24HR TABS] 360 tablet 0    Sig: TAKE 2 TABLETS(1000 MG) BY MOUTH TWICE DAILY WITH A MEAL     Endocrinology:  Diabetes - Biguanides Failed - 04/17/2023 12:55 PM      Failed - Cr in normal range and within 360 days    Creatinine  Date Value Ref Range Status  03/31/2013 0.62 0.60 - 1.30 mg/dL Final   Creatinine, Ser  Date Value Ref Range Status  06/27/2022 0.44 (L) 0.57 - 1.00 mg/dL Final         Failed - B12 Level in normal range and within 720 days    No results found for: "VITAMINB12"       Passed - HBA1C is between 0 and 7.9 and within 180 days    Hemoglobin A1C  Date Value Ref Range Status  03/04/2023 7.6 (A) 4.0 - 5.6 % Final  12/19/2020 6.6  Final   Hgb A1c MFr Bld  Date Value Ref Range Status  06/27/2022 6.0 (H) 4.8 - 5.6 % Final    Comment:             Prediabetes: 5.7 - 6.4          Diabetes: >6.4          Glycemic control for adults with diabetes: <7.0          Passed - eGFR in normal range and within 360 days    EGFR (African American)  Date Value Ref Range Status  03/31/2013 >60  Final   GFR calc Af Amer  Date Value Ref Range Status  11/25/2019 113 >59 mL/min/1.73 Final    Comment:    **[facility] currently reports eGFR in compliance with the current**   recommendations of the [employer]. [facility] will   update reporting as new guidelines are published from the NKF-ASN   Task force.    EGFR (Non-African Amer.)  Date Value Ref Range Status  03/31/2013 >60  Final    Comment:    eGFR values <40mL/min/1.73 m2 may be an indication of chronic kidney disease (CKD). Calculated eGFR is useful in patients with stable renal function. The eGFR calculation will not be reliable in acutely ill patients when serum creatinine is changing rapidly. It is not useful in  patients on dialysis. The eGFR calculation  may not be applicable to patients at the low and high extremes of body sizes, pregnant women, and vegetarians.    GFR calc non Af Amer  Date Value Ref Range Status  11/25/2019 98 >59 mL/min/1.73 Final   eGFR  Date Value Ref Range Status  06/27/2022 107 >59 mL/min/1.73 Final         Passed - Valid encounter within last 6 months    Recent Outpatient Visits           1 month ago Type II diabetes mellitus with complication [facility])   [place] [facility] at [facility], Nyoka Cowden, MD   5 months ago Type II diabetes mellitus with complication [facility])   [place] [facility] at [facility], Nyoka Cowden, MD   9 months ago Annual physical exam   [facility] at [facility], Nyoka Cowden, MD   12 months ago Type II diabetes mellitus with complication [facility])   [place] [facility] at [facility], Nyoka Cowden, MD   1 year ago Type II diabetes mellitus with complication [facility])   [place] [facility] at [facility], Nyoka Cowden, MD       Future Appointments             In 2 months Judithann Graves Nyoka Cowden, MD [facility] at [facility], [facility]            Passed - CBC within normal limits and completed in the last 12 months    WBC  Date Value Ref Range Status  06/27/2022 10.2 3.4 - 10.8 x10E3/uL Final  07/21/2018 11.7 (H) 4.0 - 10.5 K/uL Final   RBC  Date Value Ref Range Status  06/27/2022 4.29 3.77 - 5.28 x10E6/uL Final  07/21/2018 4.63 3.87 - 5.11 MIL/uL Final   Hemoglobin  Date Value Ref Range Status  06/27/2022 13.0 11.1 - 15.9 g/dL Final   Hematocrit  Date Value Ref Range Status  06/27/2022 40.2 34.0 - 46.6 % Final   MCHC  Date Value Ref Range Status  06/27/2022 32.3 31.5 - 35.7 g/dL Final  78/29/5621 30.8 30.0 - 36.0 g/dL Final   [facility]  Date Value Ref Range Status  06/27/2022  30.3 26.6 - 33.0 pg Final  07/21/2018 31.7 26.0 - 34.0 pg Final   MCV  Date Value Ref Range Status  06/27/2022 94 79 - 97 fL Final  03/31/2013 96 80 - 100 fL Final   No results found for: "PLTCOUNTKUC", "LABPLAT", "POCPLA" RDW  Date Value Ref Range Status  06/27/2022 12.3 11.7 - 15.4 % Final  03/31/2013 12.8 11.5 - 14.5 % Final

## 2023-04-22 ENCOUNTER — Other Ambulatory Visit: Payer: Self-pay | Admitting: Internal Medicine

## 2023-04-22 ENCOUNTER — Ambulatory Visit: Payer: Self-pay | Admitting: Internal Medicine

## 2023-04-22 DIAGNOSIS — J439 Emphysema, unspecified: Secondary | ICD-10-CM

## 2023-04-22 NOTE — Telephone Encounter (Signed)
 FYI- pt has an appt for tomorrow.  KP

## 2023-04-22 NOTE — Telephone Encounter (Signed)
   Chief Complaint: cough Symptoms: SOB when coughing  Frequency: comes and goes   Disposition: [] ED /[] Urgent Care (no appt availability in office) / [x] Appointment(In office/virtual)/ []  [place] [facility]/ [] Home Care/ [] Refused Recommended Disposition /[] [place] Mobile [facility]/ []  Follow-up with PCP Additional Notes: Hannah [name] calling with productive cough since January. Hannah [name] has emphysema and allergies, but was trying to treat this with older nebulizer and OTC medication. Hannah [name] needs medication refill orders.  Hannah [name] states the mucus [place] light yellow. Hannah [name] states the coughing attacks create SOB. Hannah [name] denies SOB any other time. Per protocol, Hannah [name] to be seen within 24 hours. Hannah [name] declined appt today due to out of town. Hannah [name] has appt tomorrow at 1120. RN gave care advice and Hannah [name] verbalized understanding.              Copied from CRM [id number]. Topic: Clinical - Red Word Triage >> Apr 22, 2023  2:33 PM Dennison Nancy wrote: Red Word that prompted transfer to Nurse Triage: had a cold now coughing up phelm - coughing so much effective her breathing - patient is on an inhaler Reason for Disposition  SEVERE coughing spells (e.g., whooping sound after coughing, vomiting after coughing)  Answer Assessment - Initial Assessment Questions 1. ONSET: "When did the cough begin?"      In January  2. SEVERITY: "How bad is the cough today?"      Coughing fits  3. SPUTUM: "Describe the color of your sputum" (none, dry cough; clear, white, yellow, green)     Light yellow  4. HEMOPTYSIS: "Are you coughing up any blood?" If so ask: "How much?" (flecks, streaks, tablespoons, etc.)     Denies  5. DIFFICULTY BREATHING: "Are you having difficulty breathing?" If Yes, ask: "How bad is it?" (e.g., mild, moderate, severe)    - MILD: No SOB at rest, mild SOB with walking, speaks normally in sentences, can lie down, no retractions, pulse < 100.    - MODERATE: SOB at rest, SOB with minimal exertion and prefers to sit, cannot lie down flat,  speaks in phrases, mild retractions, audible wheezing, pulse 100-120.    - SEVERE: Very SOB at rest, speaks in single words, struggling to breathe, sitting hunched forward, retractions, pulse > 120      Mild  6. FEVER: "Do you have a fever?" If Yes, ask: "What is your temperature, how was it measured, and when did it start?"     Denies  7. CARDIAC HISTORY: "Do you have any history of heart disease?" (e.g., heart attack, congestive heart failure)      Denies  8. LUNG HISTORY: "Do you have any history of lung disease?"  (e.g., pulmonary embolus, asthma, emphysema)     Emphysema  9. PE RISK FACTORS: "Do you have a history of blood clots?" (or: recent major surgery, recent prolonged travel, bedridden)     Denies  10. OTHER SYMPTOMS: "Do you have any other symptoms?" (e.g., runny nose, wheezing, chest pain)       Runny nose  Protocols used: Cough - Acute Productive-A-AH

## 2023-04-22 NOTE — Telephone Encounter (Unsigned)
 Copied from CRM [id number]. Topic: Clinical - Medication Refill >> Apr 22, 2023  2:28 PM Dennison Nancy wrote: Most Recent Primary Care Visit:  Provider: Reubin Milan  Department: [facility] [place]  Visit Type: OFFICE VISIT  Date: 03/04/2023  Medication: albuterol (VENTOLIN HFA) 108 (90 Base) MCG/ACT inhaler    Has the patient contacted their pharmacy? Yes (Agent: If no, request that the patient contact the pharmacy for the refill. If patient does not wish to contact the pharmacy document the reason why and proceed with request.) (Agent: If yes, when and what did the pharmacy advise?)  Is this the correct pharmacy for this prescription? Yes If no, delete pharmacy and type the correct one.  This is the patient's preferred pharmacy:  [facility][id number] [place] - [address] AT [facility] OF [address] [place] [address] Phone: [phone] Fax: [phone]  Has the prescription been filled recently? No  Is the patient out of the medication? Yes ,   Has the patient been seen for an appointment in the last year OR does the patient have an upcoming appointment? Yes  Can we respond through MyChart? No  Agent: Please be advised that Rx refills may take up to 3 business days. We ask that you follow-up with your pharmacy.

## 2023-04-23 ENCOUNTER — Encounter: Payer: Self-pay | Admitting: Internal Medicine

## 2023-04-23 ENCOUNTER — Ambulatory Visit
Admission: RE | Admit: 2023-04-23 | Discharge: 2023-04-23 | Disposition: A | Discharge status: Home / Self Care | Source: Ambulatory Visit | Attending: Internal Medicine | Admitting: Internal Medicine

## 2023-04-23 ENCOUNTER — Ambulatory Visit (INDEPENDENT_AMBULATORY_CARE_PROVIDER_SITE_OTHER): Admitting: Internal Medicine

## 2023-04-23 ENCOUNTER — Ambulatory Visit
Admission: RE | Admit: 2023-04-23 | Discharge: 2023-04-23 | Disposition: A | Discharge status: Home / Self Care | Attending: Internal Medicine | Admitting: Internal Medicine

## 2023-04-23 VITALS — BP 118/72 | HR 81 | Temp 97.8°F | Ht 65.0 in | Wt 124.1 lb

## 2023-04-23 DIAGNOSIS — J441 Chronic obstructive pulmonary disease with (acute) exacerbation: Secondary | ICD-10-CM

## 2023-04-23 MED ORDER — PREDNISONE 10 MG PO TABS: ORAL_TABLET | ORAL | 0 refills | Status: AC

## 2023-04-23 MED ORDER — DOXYCYCLINE HYCLATE 100 MG PO TABS: 100.0000 mg | ORAL_TABLET | Freq: Two times a day (BID) | ORAL | 0 refills | Status: AC

## 2023-04-23 MED ORDER — ALBUTEROL SULFATE HFA 108 (90 BASE) MCG/ACT IN AERS: 2.0000 | INHALATION_SPRAY | RESPIRATORY_TRACT | 5 refills | Status: DC | PRN

## 2023-04-23 NOTE — Telephone Encounter (Signed)
Already ordered today in a separate refill encounter.

## 2023-04-23 NOTE — Assessment & Plan Note (Signed)
 Recurrent, chronic symptoms despite Wixella and albuterol.

## 2023-04-23 NOTE — Progress Notes (Signed)
 Date:  04/23/2023   Name:  Hannah [name]   DOB:  [birth year]   MRN:  [id number]   Chief Complaint: Cough (X 3 months, recent cold, cough comes and goes, running out of inhaler )  Cough This is a new problem. The current episode started 1 to 4 weeks ago. The problem has been gradually worsening. The problem occurs every few minutes. The cough is Productive of sputum. Associated symptoms include shortness of breath and wheezing. Pertinent negatives include no chest pain, chills, fever or headaches. The symptoms are aggravated by pollens, dust and animals. She has tried a beta-agonist inhaler and steroid inhaler (using albuterol multiple times per day) for the symptoms. The treatment provided mild relief.    Review of Systems  Constitutional:  Negative for chills, fatigue and fever.  Respiratory:  Positive for cough, shortness of breath and wheezing. Negative for chest tightness.   Cardiovascular:  Negative for chest pain and palpitations.  Neurological:  Negative for dizziness and headaches.  Psychiatric/Behavioral:  Negative for dysphoric mood and sleep disturbance. The patient is not nervous/anxious.      Lab Results  Component Value Date   NA 139 06/27/2022   K 4.1 06/27/2022   CO2 22 06/27/2022   GLUCOSE 86 06/27/2022   BUN 16 06/27/2022   CREATININE 0.44 (L) 06/27/2022   CALCIUM 9.7 06/27/2022   EGFR 107 06/27/2022   GFRNONAA 98 11/25/2019   Lab Results  Component Value Date   CHOL 153 06/27/2022   HDL 54 06/27/2022   LDLCALC 79 06/27/2022   TRIG 112 06/27/2022   CHOLHDL 2.8 06/27/2022   Lab Results  Component Value Date   TSH 0.898 06/27/2022   Lab Results  Component Value Date   HGBA1C 7.6 (A) 03/04/2023   Lab Results  Component Value Date   WBC 10.2 06/27/2022   HGB 13.0 06/27/2022   HCT 40.2 06/27/2022   MCV 94 06/27/2022   PLT 406 06/27/2022   Lab Results  Component Value Date   ALT 12 06/27/2022   AST 19 06/27/2022   ALKPHOS 68 06/27/2022    BILITOT 0.3 06/27/2022   No results found for: "25OHVITD2", "25OHVITD3", "VD25OH"   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   Tobacco use disorder, mild, 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   Degenerative joint disease involving multiple joints 09/28/2013    Allergies  Allergen Reactions   Levofloxacin Nausea And Vomiting    Past Surgical History:  Procedure Laterality Date   COLONOSCOPY WITH PROPOFOL N/A 03/05/2016   Procedure: COLONOSCOPY WITH PROPOFOL;  Surgeon: Midge Minium, MD;  Location: [facility];  Service: Endoscopy;  Laterality: N/A;   COLONOSCOPY WITH PROPOFOL N/A 09/18/2021   Procedure: COLONOSCOPY WITH PROPOFOL;  Surgeon: Midge Minium, MD;  Location: [facility];  Service: Endoscopy;  Laterality: N/A;   ESOPHAGEAL DILATION N/A 12/29/2021   Procedure: ESOPHAGEAL DILATION;  Surgeon: Midge Minium, MD;  Location: [facility];  Service: Endoscopy;  Laterality: N/A;   ESOPHAGOGASTRODUODENOSCOPY (EGD) WITH PROPOFOL N/A 12/29/2021  Procedure: ESOPHAGOGASTRODUODENOSCOPY (EGD) WITH PROPOFOL;  Surgeon: Midge Minium, MD;  Location: [facility];  Service: Endoscopy;  Laterality: N/A;  Diabetic   HIP FRACTURE SURGERY Right 03/2013   INNER EAR SURGERY Bilateral    REPLACEMENT TOTAL KNEE Right 02/2015   SHOULDER SURGERY Bilateral    rotator cuff   TOTAL KNEE ARTHROPLASTY Left 06/05/2022    Social History   Tobacco Use   Smoking status: Former     Current packs/day: 0.00    Average packs/day: 0.5 packs/day for 25.0 years (12.5 ttl pk-yrs)    Types: Cigarettes    Start date: 03/06/1991    Quit date: 03/05/2016    Years since quitting: 7.1   Smokeless tobacco: Never  Vaping Use   Vaping status: Never Used  Substance Use Topics   Alcohol use: No   Drug use: No     Medication list has been reviewed and updated.  Current Meds  Medication Sig   alendronate (FOSAMAX) 70 MG tablet TAKE 1 TABLET BY MOUTH WITH A FULL GLASS OF WATER ON AN EMPTY STOMACH EVERY 7 DAYS DO NOT EAT OR DRINK OR RECLINE FOR 30 MINUTES AFTER DOSING   atorvastatin (LIPITOR) 20 MG tablet Take 1 tablet (20 mg total) by mouth daily.   chlorzoxazone (PARAFON) 500 MG tablet Take 1 tablet by mouth 3 (three) times daily as needed.   doxycycline (VIBRA-TABS) 100 MG tablet Take 1 tablet (100 mg total) by mouth 2 (two) times daily for 10 days.   Flavocoxid 500 MG CAPS Take by oral route twice per day.   fluticasone (FLONASE) 50 MCG/ACT nasal spray SHAKE LIQUID AND USE 2 SPRAYS IN EACH NOSTRIL DAILY   fluticasone-salmeterol (WIXELA INHUB) 250-50 MCG/ACT AEPB INHALE 1 PUFF INTO THE LUNGS TWICE DAILY   gabapentin (NEURONTIN) 600 MG tablet Take 1 tablet (600 mg total) by mouth 3 (three) times daily. (Patient taking differently: Take 600 mg by mouth in the morning, at noon, in the evening, and at bedtime. Patient takes 2 qam and 2 qpm)   glimepiride (AMARYL) 2 MG tablet Take 2 mg by mouth daily with breakfast.   glucose blood (ONETOUCH ULTRA) test strip USE TO TEST BLOOD SUGAR TWICE DAILY   JANUVIA 100 MG tablet TAKE 1 TABLET(100 MG) BY MOUTH DAILY   Lancets (ONETOUCH DELICA PLUS LANCET30G) MISC Use up to 4 times daily to test blood sugar as needed   metFORMIN (GLUCOPHAGE-XR) 500 MG 24 hr tablet TAKE 2 TABLETS(1000 MG) BY MOUTH TWICE DAILY WITH A MEAL   omeprazole (PRILOSEC) 20 MG capsule Take 1 capsule (20 mg total) by mouth 2 (two) times daily before a meal.   oxyCODONE (OXY  IR/ROXICODONE) 5 MG immediate release tablet Take 5 mg by mouth every 4 (four) hours as needed (post op pain).   oxyCODONE-acetaminophen (PERCOCET) 10-325 MG tablet Take 1 tablet by mouth every 4 (four) hours as needed.   pramipexole (MIRAPEX) 0.125 MG tablet Mirapex 0.125 mg tablet  1 to 2 in pm for RLS   predniSONE (DELTASONE) 10 MG tablet Take 6 tablets (60 mg total) by mouth daily with breakfast for 2 days, THEN 5 tablets (50 mg total) daily with breakfast for 2 days, THEN 4 tablets (40 mg total) daily with breakfast for 2 days, THEN 3 tablets (30 mg total) daily with breakfast for 2 days, THEN 2 tablets (20 mg total) daily with breakfast for 2 days, THEN 1 tablet (10 mg total) daily with breakfast for 2 days.   tiZANidine (  ZANAFLEX) 4 MG tablet Take 4 mg by mouth every 6 (six) hours as needed.    [DISCONTINUED] albuterol (VENTOLIN HFA) 108 (90 Base) MCG/ACT inhaler Inhale 2 puffs into the lungs every 4 (four) hours as needed for wheezing or shortness of breath.       03/04/2023    1:52 PM 10/31/2022    1:12 PM 06/27/2022    9:33 AM 04/18/2022    3:26 PM  GAD 7 : Generalized Anxiety Score  Nervous, Anxious, on Edge 0 0 1 0  Control/stop worrying 0 0 1 0  Worry too much - different things 0 0 1 0  Trouble relaxing 0 0 1 0  Restless 0 0 0 0  Easily annoyed or irritable 0 0 1 0  Afraid - awful might happen 0 0 1 0  Total GAD 7 Score 0 0 6 0  Anxiety Difficulty Not difficult at all Not difficult at all Somewhat difficult Not difficult at all       03/04/2023    1:52 PM 10/31/2022    1:12 PM 06/27/2022    9:32 AM  Depression screen PHQ 2/9  Decreased Interest 0 0 0  Down, Depressed, Hopeless 0 0 3  PHQ - 2 Score 0 0 3  Altered sleeping 0 0 2  Tired, decreased energy 0 0 0  Change in appetite 0 1 0  Feeling bad or failure about yourself  0 0 1  Trouble concentrating 0 0 0  Moving slowly or fidgety/restless 0 0 0  Suicidal thoughts 0 0 0  PHQ-9 Score 0 1 6  Difficult doing work/chores  Not difficult at all Not difficult at all Somewhat difficult    BP Readings from Last 3 Encounters:  04/23/23 118/72  03/04/23 120/70  10/31/22 118/64    Physical Exam Constitutional:      Appearance: She is ill-appearing.  Cardiovascular:     Rate and Rhythm: Normal rate and regular rhythm.     Heart sounds: No murmur heard. Pulmonary:     Effort: Pulmonary effort is normal.     Breath sounds: Examination of the left-upper field reveals rhonchi. Rhonchi present.  Abdominal:     General: Abdomen is flat.     Palpations: Abdomen is soft.  Musculoskeletal:     Cervical back: Normal range of motion.  Neurological:     Mental Status: She is alert. Mental status is at baseline.     Gait: Gait abnormal.     Wt Readings from Last 3 Encounters:  04/23/23 124 lb 2 oz (56.3 kg)  03/04/23 124 lb (56.2 kg)  10/31/22 130 lb (59 kg)    BP 118/72   Pulse 81   Temp 97.8 F (36.6 C)   Ht 5\' 5"  (1.651 m)   Wt 124 lb 2 oz (56.3 kg)   SpO2 93%   BMI 20.66 kg/m   Assessment and Plan:  Problem List Items Addressed This Visit   None Visit Diagnoses       Acute exacerbation of chronic obstructive airways disease (HCC)    -  Primary   Treat with steroid taper and Doxycycline, STAT CXR continue Wixela and albuterol  she does not tolerate cough suppressants follow up closely if not improving   Relevant Medications   albuterol (VENTOLIN HFA) 108 (90 Base) MCG/ACT inhaler   predniSONE (DELTASONE) 10 MG tablet   Other Relevant Orders   DG Chest 2 View       No follow-ups on file.  Reubin Milan, MD [facility] [place]

## 2023-05-13 ENCOUNTER — Ambulatory Visit: Payer: Self-pay | Admitting: Internal Medicine

## 2023-05-13 NOTE — Telephone Encounter (Signed)
 Copied from CRM [id number]. Topic: Clinical - Red Word Triage >> May 13, 2023  2:12 PM Priscille Loveless wrote: Red Word that prompted transfer to Nurse Triage: Pt was seen last week and was given antibiotic and predizone and it started to get better but it has now gotten worse. SHOB, fever, cough   Chief Complaint: shortness of breath Symptoms: cough, fever, body aches Frequency: intermittent Pertinent Negatives: Patient denies chest pain Disposition: [] ED /[] Urgent Care (no appt availability in office) / [x] Appointment(In office/virtual)/ []  [place] [facility]/ [] Home Care/ [x] Refused Recommended Disposition /[] [place] Mobile [facility]/ []  Follow-up with PCP Additional Notes: Pt seen on 3/4, sts that she was started on abx and prednisone. Sts symptoms worsened after completing prednisone, now having SOB and productve cough. Pt decline appt for today with dr. Judithann Graves, requesting OV for Thursday. Will route HP to clinic for follow-up and scheduling  Reason for Disposition  [1] MILD difficulty breathing (e.g., minimal/no SOB at rest, SOB with walking, pulse <100) AND [2] still present when not coughing  Answer Assessment - Initial Assessment Questions 1. ONSET: "When did the cough begin?"      Almost 6 weeks  2. SEVERITY: "How bad is the cough today?"      Getting worse, got better on abx and prednisone  3. SPUTUM: "Describe the color of your sputum" (none, dry cough; clear, white, yellow, green)     White  4. HEMOPTYSIS: "Are you coughing up any blood?" If so ask: "How much?" (flecks, streaks, tablespoons, etc.)     No  5. DIFFICULTY BREATHING: "Are you having difficulty breathing?" If Yes, ask: "How bad is it?" (e.g., mild, moderate, severe)    - MILD: No SOB at rest, mild SOB with walking, speaks normally in sentences, can lie down, no retractions, pulse < 100.    - MODERATE: SOB at rest, SOB with minimal exertion and prefers to sit, cannot lie down flat, speaks in phrases, mild retractions,  audible wheezing, pulse 100-120.    - SEVERE: Very SOB at rest, speaks in single words, struggling to breathe, sitting hunched forward, retractions, pulse > 120      Mild-to moderate, feel SOB when moving around and when she rests she goes into a coughing which leads to shortness of breath until the coughing  6. FEVER: "Do you have a fever?" If Yes, ask: "What is your temperature, how was it measured, and when did it start?"     Yes, 101F this morning  7. CARDIAC HISTORY: "Do you have any history of heart disease?" (e.g., heart attack, congestive heart failure)      No  8. LUNG HISTORY: "Do you have any history of lung disease?"  (e.g., pulmonary embolus, asthma, emphysema)     Emphysema  9. PE RISK FACTORS: "Do you have a history of blood clots?" (or: recent major surgery, recent prolonged travel, bedridden)     No  10. OTHER SYMPTOMS: "Do you have any other symptoms?" (e.g., runny nose, wheezing, chest pain)       Body aches, headache  11. PREGNANCY: "Is there any chance you are pregnant?" "When was your last menstrual period?"       No  12. TRAVEL: "Have you traveled out of the country in the last month?" (e.g., travel history, exposures)       no  Protocols used: Cough - Acute Productive-A-AH

## 2023-05-13 NOTE — Telephone Encounter (Signed)
 Called pt went over Dr. Jaclynn Guarneri note. Pt verbalized understanding.  KP

## 2023-05-13 NOTE — Telephone Encounter (Signed)
 Please review.  KP

## 2023-06-03 DIAGNOSIS — E11630 Type 2 diabetes mellitus with periodontal disease: Secondary | ICD-10-CM | POA: Diagnosis not present

## 2023-06-03 DIAGNOSIS — E1142 Type 2 diabetes mellitus with diabetic polyneuropathy: Secondary | ICD-10-CM | POA: Diagnosis not present

## 2023-06-03 DIAGNOSIS — G114 Hereditary spastic paraplegia: Secondary | ICD-10-CM | POA: Diagnosis not present

## 2023-06-03 DIAGNOSIS — Z87891 Personal history of nicotine dependence: Secondary | ICD-10-CM | POA: Diagnosis not present

## 2023-06-03 DIAGNOSIS — Z8249 Family history of ischemic heart disease and other diseases of the circulatory system: Secondary | ICD-10-CM | POA: Diagnosis not present

## 2023-06-03 DIAGNOSIS — J449 Chronic obstructive pulmonary disease, unspecified: Secondary | ICD-10-CM | POA: Diagnosis not present

## 2023-06-03 DIAGNOSIS — R32 Unspecified urinary incontinence: Secondary | ICD-10-CM | POA: Diagnosis not present

## 2023-06-03 DIAGNOSIS — E1143 Type 2 diabetes mellitus with diabetic autonomic (poly)neuropathy: Secondary | ICD-10-CM | POA: Diagnosis not present

## 2023-06-03 DIAGNOSIS — Z008 Encounter for other general examination: Secondary | ICD-10-CM | POA: Diagnosis not present

## 2023-06-03 DIAGNOSIS — F325 Major depressive disorder, single episode, in full remission: Secondary | ICD-10-CM | POA: Diagnosis not present

## 2023-06-03 DIAGNOSIS — K219 Gastro-esophageal reflux disease without esophagitis: Secondary | ICD-10-CM | POA: Diagnosis not present

## 2023-06-03 DIAGNOSIS — Z833 Family history of diabetes mellitus: Secondary | ICD-10-CM | POA: Diagnosis not present

## 2023-06-03 DIAGNOSIS — E785 Hyperlipidemia, unspecified: Secondary | ICD-10-CM | POA: Diagnosis not present

## 2023-06-05 ENCOUNTER — Other Ambulatory Visit: Payer: Self-pay | Admitting: Internal Medicine

## 2023-06-05 DIAGNOSIS — E1169 Type 2 diabetes mellitus with other specified complication: Secondary | ICD-10-CM

## 2023-06-05 NOTE — Telephone Encounter (Signed)
 Copied from CRM [id number]. Topic: Clinical - Medication Refill >> Jun 05, 2023 10:32 AM Arminda Landmark wrote: Most Recent Primary Care Visit:  Provider: Sheron Dixons  Department: [facility] [place]  Visit Type: ACUTE  Date: 04/23/2023  Medication: atorvastatin (LIPITOR) 20 MG tablet  Has the patient contacted their pharmacy? Yes (Agent: If no, request that the patient contact the pharmacy for the refill. If patient does not wish to contact the pharmacy document the reason why and proceed with request.) (Agent: If yes, when and what did the pharmacy advise?)  Is this the correct pharmacy for this prescription? Yes If no, delete pharmacy and type the correct one.  This is the patient's preferred pharmacy:  [facility] #[id number] [facility] [place] - [address] AT [facility] [address] [place] [address] Phone: [phone] Fax: [phone]   Has the prescription been filled recently? No  Is the patient out of the medication? Yes  Has the patient been seen for an appointment in the last year OR does the patient have an upcoming appointment? Yes  Can we respond through MyChart? No  Agent: Please be advised that Rx refills may take up to 3 business days. We ask that you follow-up with your pharmacy.

## 2023-06-06 NOTE — Telephone Encounter (Signed)
 Refused atorvastatin 20 mg because I called [facility] # [phone] to be sure she had a refill.    Pharmacist let me know the pt picked up this rx today.

## 2023-06-06 NOTE — Telephone Encounter (Signed)
 Refilled 03/04/2023 #90 1 rf - pt should have a refill. Need to call pharmacy.

## 2023-06-14 DIAGNOSIS — G8918 Other acute postprocedural pain: Secondary | ICD-10-CM | POA: Diagnosis not present

## 2023-06-14 DIAGNOSIS — M47812 Spondylosis without myelopathy or radiculopathy, cervical region: Secondary | ICD-10-CM | POA: Diagnosis not present

## 2023-06-14 DIAGNOSIS — R202 Paresthesia of skin: Secondary | ICD-10-CM | POA: Diagnosis not present

## 2023-06-14 DIAGNOSIS — M4802 Spinal stenosis, cervical region: Secondary | ICD-10-CM | POA: Diagnosis not present

## 2023-06-14 DIAGNOSIS — G894 Chronic pain syndrome: Secondary | ICD-10-CM | POA: Diagnosis not present

## 2023-06-14 DIAGNOSIS — M47816 Spondylosis without myelopathy or radiculopathy, lumbar region: Secondary | ICD-10-CM | POA: Diagnosis not present

## 2023-06-14 DIAGNOSIS — G2581 Restless legs syndrome: Secondary | ICD-10-CM | POA: Diagnosis not present

## 2023-06-14 DIAGNOSIS — M791 Myalgia, unspecified site: Secondary | ICD-10-CM | POA: Diagnosis not present

## 2023-06-14 DIAGNOSIS — E1140 Type 2 diabetes mellitus with diabetic neuropathy, unspecified: Secondary | ICD-10-CM | POA: Diagnosis not present

## 2023-06-14 DIAGNOSIS — Z79899 Other long term (current) drug therapy: Secondary | ICD-10-CM | POA: Diagnosis not present

## 2023-06-14 DIAGNOSIS — M79642 Pain in left hand: Secondary | ICD-10-CM | POA: Diagnosis not present

## 2023-06-14 DIAGNOSIS — M48061 Spinal stenosis, lumbar region without neurogenic claudication: Secondary | ICD-10-CM | POA: Diagnosis not present

## 2023-06-25 DIAGNOSIS — M47812 Spondylosis without myelopathy or radiculopathy, cervical region: Secondary | ICD-10-CM | POA: Diagnosis not present

## 2023-06-27 DIAGNOSIS — M47812 Spondylosis without myelopathy or radiculopathy, cervical region: Secondary | ICD-10-CM | POA: Diagnosis not present

## 2023-07-01 DIAGNOSIS — M47817 Spondylosis without myelopathy or radiculopathy, lumbosacral region: Secondary | ICD-10-CM | POA: Diagnosis not present

## 2023-07-05 ENCOUNTER — Encounter: Payer: Self-pay | Admitting: Internal Medicine

## 2023-07-05 ENCOUNTER — Ambulatory Visit (INDEPENDENT_AMBULATORY_CARE_PROVIDER_SITE_OTHER): Payer: Self-pay | Admitting: Internal Medicine

## 2023-07-05 VITALS — BP 142/72 | HR 56 | Ht 65.0 in | Wt 117.5 lb

## 2023-07-05 DIAGNOSIS — Z7984 Long term (current) use of oral hypoglycemic drugs: Secondary | ICD-10-CM

## 2023-07-05 DIAGNOSIS — E118 Type 2 diabetes mellitus with unspecified complications: Secondary | ICD-10-CM

## 2023-07-05 DIAGNOSIS — Z0000 Encounter for general adult medical examination without abnormal findings: Secondary | ICD-10-CM | POA: Diagnosis not present

## 2023-07-05 DIAGNOSIS — E785 Hyperlipidemia, unspecified: Secondary | ICD-10-CM | POA: Diagnosis not present

## 2023-07-05 DIAGNOSIS — J432 Centrilobular emphysema: Secondary | ICD-10-CM

## 2023-07-05 DIAGNOSIS — D485 Neoplasm of uncertain behavior of skin: Secondary | ICD-10-CM

## 2023-07-05 DIAGNOSIS — M818 Other osteoporosis without current pathological fracture: Secondary | ICD-10-CM | POA: Diagnosis not present

## 2023-07-05 DIAGNOSIS — E1169 Type 2 diabetes mellitus with other specified complication: Secondary | ICD-10-CM | POA: Diagnosis not present

## 2023-07-05 DIAGNOSIS — G114 Hereditary spastic paraplegia: Secondary | ICD-10-CM | POA: Diagnosis not present

## 2023-07-05 MED ORDER — FLUCONAZOLE 100 MG PO TABS: 100.0000 mg | ORAL_TABLET | Freq: Once | ORAL | 0 refills | Status: AC

## 2023-07-05 MED ORDER — BLOOD GLUCOSE MONITORING SUPPL DEVI
1.0000 | Freq: Three times a day (TID) | 0 refills | Status: AC
Start: 2023-07-05 — End: ?

## 2023-07-05 MED ORDER — METFORMIN HCL ER 500 MG PO TB24: 1000.0000 mg | ORAL_TABLET | Freq: Two times a day (BID) | ORAL | 1 refills | Status: DC

## 2023-07-05 MED ORDER — SHINGRIX 50 MCG/0.5ML IM SUSR: 0.5000 mL | Freq: Once | INTRAMUSCULAR | 1 refills | Status: AC

## 2023-07-05 MED ORDER — GLIMEPIRIDE 2 MG PO TABS: 2.0000 mg | ORAL_TABLET | Freq: Every day | ORAL | 1 refills | Status: DC

## 2023-07-05 MED ORDER — LANCET DEVICE MISC: 1.0000 | Freq: Three times a day (TID) | 0 refills | Status: AC

## 2023-07-05 NOTE — Progress Notes (Signed)
 Date:  07/05/2023   Name:  Hannah [name]   DOB:  [birth year]   MRN:  [id number]   Chief Complaint: Annual Exam and Medication Refill (New glucometer meter) Hannah [name] is a 68 y.o. female who presents today for her Complete Annual Exam. She feels fairly well. She reports exercising none. She reports she is sleeping poorly. Breast complaints none.  Health Maintenance  Topic Date Due   Zoster (Shingles) Vaccine (1 of 2) Never done   COVID-19 Vaccine (3 - Pfizer risk series) 07/08/2019   Yearly kidney function blood test for diabetes  06/27/2023   Yearly kidney health urinalysis for diabetes  06/27/2023   Eye exam for diabetics  07/31/2023   Hemoglobin A1C  09/01/2023   Flu Shot  09/20/2023   Medicare Annual Wellness Visit  10/31/2023   Mammogram  03/17/2024   Complete foot exam   07/04/2024   Colon Cancer Screening  09/18/2028   DTaP/Tdap/Td vaccine (2 - Td or Tdap) 11/24/2029   Pneumonia Vaccine  Completed   DEXA scan (bone density measurement)  Completed   Hepatitis C Screening  Completed   HPV Vaccine  Aged Out   Meningitis B Vaccine  Aged Out     Diabetes Pertinent negatives for hypoglycemia include no dizziness, headaches or nervousness/anxiousness. Pertinent negatives for diabetes include no chest pain, no fatigue and no weakness.  Hyperlipidemia This is a chronic problem. The problem is controlled. There are no known factors aggravating her hyperlipidemia. Associated symptoms include myalgias. Pertinent negatives include no chest pain or shortness of breath. Current antihyperlipidemic treatment includes statins (januvia , metformin , glipizide). The current treatment provides significant improvement of lipids.  COPD - stable symptoms on maintenance inhalers and albuterol  PRN.  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   Review of Systems  Constitutional:  Negative for fatigue and unexpected weight change.  HENT:  Negative for trouble swallowing.   Eyes:   Negative for visual disturbance.  Respiratory:  Negative for cough, chest tightness, shortness of breath and wheezing.   Cardiovascular:  Negative for chest pain, palpitations and leg swelling.  Gastrointestinal:  Negative for abdominal pain, constipation and diarrhea.  Genitourinary:  Positive for vaginal discharge (and itching).  Musculoskeletal:  Positive for arthralgias, gait problem and myalgias.  Skin:        Skin lesion on left ear recurrent   Neurological:  Negative for dizziness, weakness, light-headedness and headaches.  Psychiatric/Behavioral:  Positive for sleep disturbance. Negative for dysphoric mood. The patient is not nervous/anxious.      Lab Results  Component Value Date   NA 139 06/27/2022   K 4.1 06/27/2022   CO2 22 06/27/2022   GLUCOSE 86 06/27/2022   BUN 16 06/27/2022   CREATININE 0.44 (L) 06/27/2022   CALCIUM  9.7 06/27/2022   EGFR 107 06/27/2022   GFRNONAA 98 11/25/2019   Lab Results  Component Value Date   CHOL 153 06/27/2022   HDL 54 06/27/2022   LDLCALC 79 06/27/2022   TRIG 112 06/27/2022   CHOLHDL 2.8 06/27/2022   Lab Results  Component Value Date   TSH 0.898 06/27/2022   Lab Results  Component Value Date   HGBA1C 7.6 (A) 03/04/2023   Lab Results  Component Value Date   WBC 10.2 06/27/2022   HGB 13.0 06/27/2022   HCT 40.2 06/27/2022   MCV 94 06/27/2022   PLT 406 06/27/2022   Lab Results  Component Value Date   ALT 12 06/27/2022   AST 19  06/27/2022   ALKPHOS 68 06/27/2022   BILITOT 0.3 06/27/2022   No results found for: "25OHVITD2", "25OHVITD3", "VD25OH"   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   History of colonic polyps    Benign neoplasm of descending colon    Other osteoporosis without current pathological fracture 11/25/2015   Hyperlipidemia associated with type 2 diabetes mellitus (HCC) 03/28/2015   Dysphagia 11/11/2014   Autosomal dominant hereditary spastic  paraplegia (HCC) 11/11/2014   Type II diabetes mellitus with complication (HCC) 11/11/2014   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   Tobacco use disorder, mild, 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   Degenerative joint disease involving multiple joints 09/28/2013    Allergies  Allergen Reactions   Levofloxacin  Nausea And Vomiting    Past Surgical History:  Procedure Laterality Date   COLONOSCOPY WITH PROPOFOL  N/A 03/05/2016   Procedure: COLONOSCOPY WITH PROPOFOL ;  Surgeon: Marnee Sink, MD;  Location: [facility];  Service: Endoscopy;  Laterality: N/A;   COLONOSCOPY WITH PROPOFOL  N/A 09/18/2021   Procedure: COLONOSCOPY WITH PROPOFOL ;  Surgeon: Marnee Sink, MD;  Location: [facility];  Service: Endoscopy;  Laterality: N/A;   ESOPHAGEAL DILATION N/A 12/29/2021   Procedure: ESOPHAGEAL DILATION;  Surgeon: Marnee Sink, MD;  Location: [facility];  Service: Endoscopy;  Laterality: N/A;   ESOPHAGOGASTRODUODENOSCOPY (EGD) WITH PROPOFOL  N/A 12/29/2021   Procedure: ESOPHAGOGASTRODUODENOSCOPY (EGD) WITH PROPOFOL ;  Surgeon: Marnee Sink, MD;  Location: [facility];  Service: Endoscopy;  Laterality: N/A;  Diabetic   HIP FRACTURE SURGERY Right 03/2013   INNER EAR SURGERY Bilateral    REPLACEMENT TOTAL KNEE Right 02/2015   SHOULDER SURGERY Bilateral    rotator cuff   TOTAL KNEE ARTHROPLASTY Left 06/05/2022    Social History   Tobacco Use   Smoking status: Former    Current packs/day: 0.00    Average packs/day: 0.5 packs/day for 25.0 years (12.5 ttl pk-yrs)    Types: Cigarettes    Start date: 03/06/1991    Quit date: 03/05/2016    Years since quitting: 7.3   Smokeless tobacco: Never  Vaping Use   Vaping status: Never Used  Substance Use Topics   Alcohol use: No   Drug use: No     Medication list has been reviewed and updated.  Current Meds  Medication Sig   albuterol  (VENTOLIN  HFA) 108 (90 Base) MCG/ACT inhaler  Inhale 2 puffs into the lungs every 4 (four) hours as needed for wheezing or shortness of breath.   alendronate  (FOSAMAX ) 70 MG tablet TAKE 1 TABLET BY MOUTH WITH A FULL GLASS OF WATER  ON AN EMPTY STOMACH EVERY 7 DAYS DO NOT EAT OR DRINK OR RECLINE FOR 30 MINUTES AFTER DOSING   atorvastatin  (LIPITOR) 20 MG tablet Take 1 tablet (20 mg total) by mouth daily.   Blood Glucose Monitoring Suppl DEVI 1 each by Does not apply route in the morning, at noon, and at bedtime. May substitute to any manufacturer covered by patient's insurance.   chlorzoxazone (PARAFON) 500 MG tablet Take 1 tablet by mouth 3 (three) times daily as needed.   diclofenac Sodium (VOLTAREN) 1 % GEL Apply 4g 4 times a day by topical route as directed.   Flavocoxid 500 MG CAPS Take by oral route twice per day.   fluconazole  (DIFLUCAN ) 100 MG tablet Take 1 tablet (100 mg total) by mouth once for 1 dose.   fluticasone  (FLONASE ) 50 MCG/ACT nasal spray SHAKE  LIQUID AND USE 2 SPRAYS IN EACH NOSTRIL DAILY   fluticasone -salmeterol (WIXELA INHUB) 250-50 MCG/ACT AEPB INHALE 1 PUFF INTO THE LUNGS TWICE DAILY   gabapentin  (NEURONTIN ) 600 MG tablet Take 1 tablet (600 mg total) by mouth 3 (three) times daily. (Patient taking differently: Take 600 mg by mouth in the morning, at noon, in the evening, and at bedtime. Patient takes 2 qam and 2 qpm)   glucose blood (ONETOUCH ULTRA) test strip USE TO TEST BLOOD SUGAR TWICE DAILY   JANUVIA  100 MG tablet TAKE 1 TABLET(100 MG) BY MOUTH DAILY   Lancet Device MISC 1 each by Does not apply route in the morning, at noon, and at bedtime. May substitute to any manufacturer covered by patient's insurance.   Lancets (ONETOUCH DELICA PLUS LANCET30G) MISC Use up to 4 times daily to test blood sugar as needed   omeprazole  (PRILOSEC ) 20 MG capsule Take 1 capsule (20 mg total) by mouth 2 (two) times daily before a meal.   oxyCODONE -acetaminophen  (PERCOCET) 10-325 MG tablet Take 1 tablet by mouth every 4 (four) hours as  needed.   pramipexole (MIRAPEX) 0.125 MG tablet Mirapex 0.125 mg tablet  1 to 2 in pm for RLS   tiZANidine (ZANAFLEX) 4 MG tablet Take 4 mg by mouth every 6 (six) hours as needed.    Zoster Vaccine Adjuvanted [facility]) injection Inject 0.5 mLs into the muscle once for 1 dose.   [DISCONTINUED] glimepiride  (AMARYL ) 2 MG tablet Take 2 mg by mouth daily with breakfast.   [DISCONTINUED] metFORMIN  (GLUCOPHAGE -XR) 500 MG 24 hr tablet TAKE 2 TABLETS(1000 MG) BY MOUTH TWICE DAILY WITH A MEAL       07/05/2023    9:39 AM 03/04/2023    1:52 PM 10/31/2022    1:12 PM 06/27/2022    9:33 AM  GAD 7 : Generalized Anxiety Score  Nervous, Anxious, on Edge 0 0 0 1  Control/stop worrying 0 0 0 1  Worry too much - different things 0 0 0 1  Trouble relaxing 1 0 0 1  Restless 0 0 0 0  Easily annoyed or irritable 1 0 0 1  Afraid - awful might happen 0 0 0 1  Total GAD 7 Score 2 0 0 6  Anxiety Difficulty Somewhat difficult Not difficult at all Not difficult at all Somewhat difficult       07/05/2023    9:39 AM 03/04/2023    1:52 PM 10/31/2022    1:12 PM  Depression screen PHQ 2/9  Decreased Interest 1 0 0  Down, Depressed, Hopeless 0 0 0  PHQ - 2 Score 1 0 0  Altered sleeping 1 0 0  Tired, decreased energy 1 0 0  Change in appetite 1 0 1  Feeling bad or failure about yourself  0 0 0  Trouble concentrating 0 0 0  Moving slowly or fidgety/restless 0 0 0  Suicidal thoughts 0 0 0  PHQ-9 Score 4 0 1  Difficult doing work/chores Somewhat difficult Not difficult at all Not difficult at all    BP Readings from Last 3 Encounters:  07/05/23 (!) 142/72  04/23/23 118/72  03/04/23 120/70    Physical Exam Vitals and nursing note reviewed.  Constitutional:      General: She is not in acute distress.    Appearance: She is well-developed.  HENT:     Head: Normocephalic and atraumatic.     Right Ear: Tympanic membrane and ear canal normal.     Left Ear: Tympanic  membrane and ear canal normal.     Nose:      Right Sinus: No maxillary sinus tenderness.     Left Sinus: No maxillary sinus tenderness.  Eyes:     General: No scleral icterus.       Right eye: No discharge.        Left eye: No discharge.     Conjunctiva/sclera: Conjunctivae normal.  Neck:     Thyroid : No thyromegaly.     Vascular: No carotid bruit.  Cardiovascular:     Rate and Rhythm: Normal rate and regular rhythm.     Pulses: Normal pulses.     Heart sounds: Normal heart sounds.  Pulmonary:     Effort: Pulmonary effort is normal. No respiratory distress.     Breath sounds: No wheezing.  Abdominal:     General: Bowel sounds are normal.     Palpations: Abdomen is soft.     Tenderness: There is no abdominal tenderness.  Musculoskeletal:     Cervical back: Normal range of motion. No erythema.     Right lower leg: No edema.     Left lower leg: No edema.  Lymphadenopathy:     Cervical: No cervical adenopathy.  Skin:    General: Skin is warm and dry.     Capillary Refill: Capillary refill takes less than 2 seconds.     Findings: No rash.  Neurological:     Mental Status: She is alert and oriented to person, place, and time. Mental status is at baseline.     Cranial Nerves: No cranial nerve deficit.     Sensory: No sensory deficit.     Motor: Weakness present.     Gait: Gait abnormal.     Deep Tendon Reflexes: Reflexes are normal and symmetric.  Psychiatric:        Attention and Perception: Attention normal.        Mood and Affect: Mood normal.    Diabetic Foot Exam - Simple   Simple Foot Form Diabetic Foot exam was performed with the following findings: Yes 07/05/2023  9:51 AM  Visual Inspection No deformities, no ulcerations, no other skin breakdown bilaterally: Yes Sensation Testing Intact to touch and monofilament testing bilaterally: Yes Pulse Check Posterior Tibialis and Dorsalis pulse intact bilaterally: Yes Comments      Wt Readings from Last 3 Encounters:  07/05/23 117 lb 8 oz (53.3 kg)  04/23/23  124 lb 2 oz (56.3 kg)  03/04/23 124 lb (56.2 kg)    BP (!) 142/72   Pulse (!) 56   Ht 5\' 5"  (1.651 m)   Wt 117 lb 8 oz (53.3 kg)   SpO2 93%   BMI 19.55 kg/m   Assessment and Plan:  Problem List Items Addressed This Visit       Unprioritized   Type II diabetes mellitus with complication (HCC) (Chronic)   Relevant Medications   glimepiride  (AMARYL ) 2 MG tablet   metFORMIN  (GLUCOPHAGE -XR) 500 MG 24 hr tablet   Blood Glucose Monitoring Suppl DEVI   Lancet Device MISC   Other Relevant Orders   Comprehensive metabolic panel with GFR   Hemoglobin A1c   Microalbumin / creatinine urine ratio   TSH   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 DEXA next year      Autosomal dominant hereditary spastic paraplegia (HCC)   Other Visit Diagnoses       Annual physical exam    -  Primary   consider Shingles vaccine up to date on other screenings and immunizations.     Neoplasm of uncertain behavior of skin       on left ear and chest refer to Dermatology   Relevant Orders   Ambulatory referral to Dermatology     Long term current use of oral hypoglycemic drug           Return in about 4 months (around 11/05/2023) for DM.    Sheron Dixons, MD [facility] [place]

## 2023-07-05 NOTE — Assessment & Plan Note (Signed)
 On Alendronate  without side effects Continue therapy. DEXA next year

## 2023-07-06 LAB — CBC WITH DIFFERENTIAL/PLATELET
Basophils Absolute: 0.1 10*3/uL (ref 0.0–0.2)
Basos: 1 %
EOS (ABSOLUTE): 0.2 10*3/uL (ref 0.0–0.4)
Eos: 2 %
Hematocrit: 46.6 % (ref 34.0–46.6)
Hemoglobin: 15.1 g/dL (ref 11.1–15.9)
Immature Grans (Abs): 0 10*3/uL (ref 0.0–0.1)
Immature Granulocytes: 0 %
Lymphocytes Absolute: 3.7 10*3/uL — ABNORMAL HIGH (ref 0.7–3.1)
Lymphs: 47 %
MCH: 30.8 pg (ref 26.6–33.0)
MCHC: 32.4 g/dL (ref 31.5–35.7)
MCV: 95 fL (ref 79–97)
Monocytes Absolute: 0.5 10*3/uL (ref 0.1–0.9)
Monocytes: 6 %
Neutrophils Absolute: 3.5 10*3/uL (ref 1.4–7.0)
Neutrophils: 44 %
Platelets: 239 10*3/uL (ref 150–450)
RBC: 4.9 x10E6/uL (ref 3.77–5.28)
RDW: 13.2 % (ref 11.7–15.4)
WBC: 7.9 10*3/uL (ref 3.4–10.8)

## 2023-07-06 LAB — MICROALBUMIN / CREATININE URINE RATIO
Creatinine, Urine: 11.8 mg/dL
Microalb/Creat Ratio: <25 mg/g{creat} (ref 0–29)
Microalbumin, Urine: <3 ug/mL

## 2023-07-06 LAB — COMPREHENSIVE METABOLIC PANEL WITH GFR
ALT: 13 IU/L (ref 0–32)
AST: 14 IU/L (ref 0–40)
Albumin: 4.5 g/dL (ref 3.9–4.9)
Alkaline Phosphatase: 91 IU/L (ref 44–121)
BUN/Creatinine Ratio: 11 — ABNORMAL LOW (ref 12–28)
BUN: 7 mg/dL — ABNORMAL LOW (ref 8–27)
Bilirubin Total: 0.3 mg/dL (ref 0.0–1.2)
CO2: 23 mmol/L (ref 20–29)
Calcium: 9.7 mg/dL (ref 8.7–10.3)
Chloride: 101 mmol/L (ref 96–106)
Creatinine, Ser: 0.66 mg/dL (ref 0.57–1.00)
Globulin, Total: 2 g/dL (ref 1.5–4.5)
Glucose: 91 mg/dL (ref 70–99)
Potassium: 4.3 mmol/L (ref 3.5–5.2)
Sodium: 141 mmol/L (ref 134–144)
Total Protein: 6.5 g/dL (ref 6.0–8.5)
eGFR: 96 mL/min/{1.73_m2} (ref >59)

## 2023-07-06 LAB — LIPID PANEL
Chol/HDL Ratio: 2.9 ratio (ref 0.0–4.4)
Cholesterol, Total: 183 mg/dL (ref 100–199)
HDL: 64 mg/dL (ref >39)
LDL Chol Calc (NIH): 96 mg/dL (ref 0–99)
Triglycerides: 131 mg/dL (ref 0–149)
VLDL Cholesterol Cal: 23 mg/dL (ref 5–40)

## 2023-07-06 LAB — TSH: TSH: 3.28 u[IU]/mL (ref 0.450–4.500)

## 2023-07-06 LAB — HEMOGLOBIN A1C
Est. average glucose Bld gHb Est-mCnc: 177 mg/dL
Hgb A1c MFr Bld: 7.8 % — ABNORMAL HIGH (ref 4.8–5.6)

## 2023-07-07 ENCOUNTER — Ambulatory Visit: Payer: Self-pay | Admitting: Internal Medicine

## 2023-07-10 ENCOUNTER — Other Ambulatory Visit: Payer: Self-pay | Admitting: Internal Medicine

## 2023-07-10 DIAGNOSIS — E118 Type 2 diabetes mellitus with unspecified complications: Secondary | ICD-10-CM

## 2023-07-11 NOTE — Telephone Encounter (Signed)
 Requested Prescriptions  Refused Prescriptions Disp Refills   metFORMIN  (GLUCOPHAGE -XR) 500 MG 24 hr tablet [Pharmacy Med Name: METFORMIN  ER 500MG  24HR TABS] 360 tablet 1    Sig: TAKE 2 TABLETS(1000 MG) BY MOUTH TWICE DAILY WITH A MEAL     Endocrinology:  Diabetes - Biguanides Failed - 07/11/2023  1:37 PM      Failed - B12 Level in normal range and within 720 days    No results found for: "VITAMINB12"       Failed - CBC within normal limits and completed in the last 12 months    WBC  Date Value Ref Range Status  07/05/2023 7.9 3.4 - 10.8 x10E3/uL Final  07/21/2018 11.7 (H) 4.0 - 10.5 K/uL Final   RBC  Date Value Ref Range Status  07/05/2023 4.90 3.77 - 5.28 x10E6/uL Final  07/21/2018 4.63 3.87 - 5.11 MIL/uL Final   Hemoglobin  Date Value Ref Range Status  07/05/2023 15.1 11.1 - 15.9 g/dL Final   Hematocrit  Date Value Ref Range Status  07/05/2023 46.6 34.0 - 46.6 % Final   MCHC  Date Value Ref Range Status  07/05/2023 32.4 31.5 - 35.7 g/dL Final  40/98/1191 47.8 30.0 - 36.0 g/dL Final   [facility]  Date Value Ref Range Status  07/05/2023 30.8 26.6 - 33.0 pg Final  07/21/2018 31.7 26.0 - 34.0 pg Final   MCV  Date Value Ref Range Status  07/05/2023 95 79 - 97 fL Final  03/31/2013 96 80 - 100 fL Final   No results found for: "PLTCOUNTKUC", "LABPLAT", "POCPLA" RDW  Date Value Ref Range Status  07/05/2023 13.2 11.7 - 15.4 % Final  03/31/2013 12.8 11.5 - 14.5 % Final         Passed - Cr in normal range and within 360 days    Creatinine  Date Value Ref Range Status  03/31/2013 0.62 0.60 - 1.30 mg/dL Final   Creatinine, Ser  Date Value Ref Range Status  07/05/2023 0.66 0.57 - 1.00 mg/dL Final         Passed - HBA1C is between 0 and 7.9 and within 180 days    Hemoglobin A1C  Date Value Ref Range Status  12/19/2020 6.6  Final   Hgb A1c MFr Bld  Date Value Ref Range Status  07/05/2023 7.8 (H) 4.8 - 5.6 % Final    Comment:             Prediabetes: 5.7 - 6.4           Diabetes: >6.4          Glycemic control for adults with diabetes: <7.0          Passed - eGFR in normal range and within 360 days    EGFR (African American)  Date Value Ref Range Status  03/31/2013 >60  Final   GFR calc Af Amer  Date Value Ref Range Status  11/25/2019 113 >59 mL/min/1.73 Final    Comment:    **[facility] currently reports eGFR in compliance with the current**   recommendations of the [employer]. [facility] will   update reporting as new guidelines are published from the NKF-ASN   Task force.    EGFR (Non-African Amer.)  Date Value Ref Range Status  03/31/2013 >60  Final    Comment:    eGFR values <15mL/min/1.73 m2 may be an indication of chronic kidney disease (CKD). Calculated eGFR is useful in patients with stable renal function. The eGFR calculation will not  be reliable in acutely ill patients when serum creatinine is changing rapidly. It is not useful in  patients on dialysis. The eGFR calculation may not be applicable to patients at the low and high extremes of body sizes, pregnant women, and vegetarians.    GFR calc non Af Amer  Date Value Ref Range Status  11/25/2019 98 >59 mL/min/1.73 Final   eGFR  Date Value Ref Range Status  07/05/2023 96 >59 mL/min/1.73 Final         Passed - Valid encounter within last 6 months    Recent Outpatient Visits           6 days ago Annual physical exam   [place] [facility] at [facility], [place], MD   2 months ago Acute exacerbation of chronic obstructive airways disease [facility])   [place] [facility] at [facility], Chales [place], MD

## 2023-07-16 DIAGNOSIS — M5412 Radiculopathy, cervical region: Secondary | ICD-10-CM | POA: Diagnosis not present

## 2023-07-17 DIAGNOSIS — G5603 Carpal tunnel syndrome, bilateral upper limbs: Secondary | ICD-10-CM | POA: Diagnosis not present

## 2023-07-22 DIAGNOSIS — M654 Radial styloid tenosynovitis [de Quervain]: Secondary | ICD-10-CM | POA: Diagnosis not present

## 2023-07-22 DIAGNOSIS — G5603 Carpal tunnel syndrome, bilateral upper limbs: Secondary | ICD-10-CM | POA: Diagnosis not present

## 2023-08-07 ENCOUNTER — Other Ambulatory Visit: Payer: Self-pay | Admitting: Internal Medicine

## 2023-08-07 DIAGNOSIS — M654 Radial styloid tenosynovitis [de Quervain]: Secondary | ICD-10-CM | POA: Diagnosis not present

## 2023-08-07 DIAGNOSIS — J439 Emphysema, unspecified: Secondary | ICD-10-CM

## 2023-08-09 NOTE — Telephone Encounter (Signed)
 Requested Prescriptions  Pending Prescriptions Disp Refills   WIXELA INHUB 250-50 MCG/ACT AEPB [Pharmacy Med Name: WIXELA INHUB DISKUS 250/50MCG 60S] 60 each 2    Sig: INHALE 1 PUFF INTO THE LUNGS TWICE DAILY     Pulmonology:  Combination Products Passed - 08/09/2023 10:18 AM      Passed - Valid encounter within last 12 months    Recent Outpatient Visits           1 month ago Annual physical exam   [place] [facility] at [facility], Chales [place], MD   3 months ago Acute exacerbation of chronic obstructive airways disease [facility])   [place] [facility] at [facility], Chales [place], MD

## 2023-08-26 ENCOUNTER — Telehealth: Payer: Self-pay | Admitting: Internal Medicine

## 2023-08-26 NOTE — Telephone Encounter (Unsigned)
 Copied from CRM [id number]. Topic: Referral - Request for Referral >> Aug 26, 2023 12:04 PM Donee H wrote: Did the patient discuss referral with their provider in the last year? Yes (If No - schedule appointment) (If Yes - send message)  Appointment offered? Yes  Type of order/referral and detailed reason for visit:   Dermatologist  Patient states spoke with doctor past visit with concern of a scabby like rash on ear and chest. Patient states it's now spreading to earlobe. Patient has concern it could be skin cancer and need a referral   Preference of office, provider, location:   No preference with provider or office just the nearest in [place]   If referral order, have you been seen by this specialty before? No (If Yes, this issue or another issue? When? Where?  Can we respond through MyChart? Yes

## 2023-08-27 NOTE — Telephone Encounter (Signed)
 Spoke with pt. She said skin spot is getting worse and growing in size. Sent referral coordinator a message to call and schedule appt for pt. Informed pt she should receive a call about this soon.  - Hannah Cassin M.

## 2023-09-08 ENCOUNTER — Other Ambulatory Visit: Payer: Self-pay | Admitting: Internal Medicine

## 2023-09-08 DIAGNOSIS — M818 Other osteoporosis without current pathological fracture: Secondary | ICD-10-CM

## 2023-09-09 NOTE — Progress Notes (Signed)
 [facility] Quality Team Note  Name: Hannah [name] Date of Birth: 20-Mar-1955 MRN: [id number] Date: 09/09/2023  [facility] Quality Team has reviewed this patient's chart, please see recommendations below:  [facility] Quality Other; (CHART REVIEWED FOR [facility]SD. ABSTRACTED 07/05/2023 A1C FOR GAP CLOSURE.)

## 2023-09-10 ENCOUNTER — Other Ambulatory Visit: Payer: Self-pay

## 2023-09-10 NOTE — Telephone Encounter (Signed)
 Requested medication (s) are due for refill today: yes  Requested medication (s) are on the active medication list: yes  Last refill:  08/21/22 4 tabs 11 RF  Future visit scheduled: yes  Notes to clinic:  overdue lab work   Requested Prescriptions  Pending Prescriptions Disp Refills   alendronate  (FOSAMAX ) 70 MG tablet [Pharmacy Med Name: ALENDRONATE  70MG  TABLETS] 4 tablet 11    Sig: TAKE 1 TABLET BY MOUTH EVERY 7 DAYS WITH A FULL GLASS OF WATER  AND ON AN EMPTY STOMACH. DO NOT EAT OR DRINK OR RECLINE FOR 30 MINUTES AFTER DOSING     Endocrinology:  Bisphosphonates Failed - 09/10/2023  3:23 PM      Failed - Vitamin D in normal range and within 360 days    No results found for: [id number], CI6874NY7, CI874NY7UNU, 25OHVITD3, 25OHVITD2, 25OHVITD1, VD25OH       Failed - Mg Level in normal range and within 360 days    Magnesium   Date Value Ref Range Status  04/25/2018 2.0 1.6 - 2.3 mg/dL Final         Failed - Phosphate in normal range and within 360 days    No results found for: PHOS       Passed - Ca in normal range and within 360 days    Calcium   Date Value Ref Range Status  07/05/2023 9.7 8.7 - 10.3 mg/dL Final   Calcium , Total  Date Value Ref Range Status  03/31/2013 8.4 (L) 8.5 - 10.1 mg/dL Final         Passed - Cr in normal range and within 360 days    Creatinine  Date Value Ref Range Status  03/31/2013 0.62 0.60 - 1.30 mg/dL Final   Creatinine, Ser  Date Value Ref Range Status  07/05/2023 0.66 0.57 - 1.00 mg/dL Final         Passed - eGFR is 30 or above and within 360 days    EGFR (African American)  Date Value Ref Range Status  03/31/2013 >60  Final   GFR calc Af Amer  Date Value Ref Range Status  11/25/2019 113 >59 mL/min/1.73 Final    Comment:    **[facility] currently reports eGFR in compliance with the current**   recommendations of the [employer]. [facility] will   update reporting as new guidelines are published from the  NKF-ASN   Task force.    EGFR (Non-African Amer.)  Date Value Ref Range Status  03/31/2013 >60  Final    Comment:    eGFR values <64mL/min/1.73 m2 may be an indication of chronic kidney disease (CKD). Calculated eGFR is useful in patients with stable renal function. The eGFR calculation will not be reliable in acutely ill patients when serum creatinine is changing rapidly. It is not useful in  patients on dialysis. The eGFR calculation may not be applicable to patients at the low and high extremes of body sizes, pregnant women, and vegetarians.    GFR calc non Af Amer  Date Value Ref Range Status  11/25/2019 98 >59 mL/min/1.73 Final   eGFR  Date Value Ref Range Status  07/05/2023 96 >59 mL/min/1.73 Final         Passed - Valid encounter within last 12 months    Recent Outpatient Visits           2 months ago Annual physical exam   [facility] at [facility], Leita DEL, MD   4 months ago Acute  exacerbation of chronic obstructive airways disease [facility])   [place] [facility] at [facility], Leita DEL, MD              Passed - Bone Mineral Density or Dexa Scan completed in the last 2 years

## 2023-09-13 DIAGNOSIS — G8918 Other acute postprocedural pain: Secondary | ICD-10-CM | POA: Diagnosis not present

## 2023-09-13 DIAGNOSIS — M47812 Spondylosis without myelopathy or radiculopathy, cervical region: Secondary | ICD-10-CM | POA: Diagnosis not present

## 2023-09-13 DIAGNOSIS — M545 Low back pain, unspecified: Secondary | ICD-10-CM | POA: Diagnosis not present

## 2023-09-13 DIAGNOSIS — G2581 Restless legs syndrome: Secondary | ICD-10-CM | POA: Diagnosis not present

## 2023-09-13 DIAGNOSIS — M48061 Spinal stenosis, lumbar region without neurogenic claudication: Secondary | ICD-10-CM | POA: Diagnosis not present

## 2023-09-13 DIAGNOSIS — G894 Chronic pain syndrome: Secondary | ICD-10-CM | POA: Diagnosis not present

## 2023-09-13 DIAGNOSIS — E1140 Type 2 diabetes mellitus with diabetic neuropathy, unspecified: Secondary | ICD-10-CM | POA: Diagnosis not present

## 2023-09-13 DIAGNOSIS — R202 Paresthesia of skin: Secondary | ICD-10-CM | POA: Diagnosis not present

## 2023-09-13 DIAGNOSIS — M791 Myalgia, unspecified site: Secondary | ICD-10-CM | POA: Diagnosis not present

## 2023-09-13 DIAGNOSIS — M79642 Pain in left hand: Secondary | ICD-10-CM | POA: Diagnosis not present

## 2023-09-13 DIAGNOSIS — M4802 Spinal stenosis, cervical region: Secondary | ICD-10-CM | POA: Diagnosis not present

## 2023-09-13 DIAGNOSIS — M47816 Spondylosis without myelopathy or radiculopathy, lumbar region: Secondary | ICD-10-CM | POA: Diagnosis not present

## 2023-09-13 DIAGNOSIS — Z79899 Other long term (current) drug therapy: Secondary | ICD-10-CM | POA: Diagnosis not present

## 2023-09-17 ENCOUNTER — Telehealth: Payer: Self-pay

## 2023-09-17 NOTE — Telephone Encounter (Unsigned)
 Copied from CRM [id number]. Topic: Referral - Request for Referral >> Sep 16, 2023  4:41 PM Everette C wrote: Did the patient discuss referral with their provider in the last year? Yes (If No - schedule appointment) (If Yes - send message)  Appointment offered? No  Type of order/referral and detailed reason for visit: Dermatology, scab on left ear concern   Preference of office, provider, location: patient would like to be seen by a dermatologist that will accept [employer]  If referral order, have you been seen by this specialty before? No (If Yes, this issue or another issue? When? Where?  Can we respond through MyChart? No

## 2023-09-17 NOTE — Telephone Encounter (Signed)
 Copied from CRM [id number]. Topic: Referral - Request for Referral >> Sep 16, 2023  4:41 PM Everette C wrote: Did the patient discuss referral with their provider in the last year? Yes (If No - schedule appointment) (If Yes - send message)  Appointment offered? No  Type of order/referral and detailed reason for visit: Dermatology, scab on left ear concern   Preference of office, provider, location: patient would like to be seen by a dermatologist that will accept [employer]  If referral order, have you been seen by this specialty before? No (If Yes, this issue or another issue? When? Where?  Can we respond through MyChart? No >> Sep 17, 2023  3:42 PM Rosaria BRAVO wrote: Pt returned call, please advise

## 2023-09-17 NOTE — Telephone Encounter (Signed)
 Good afternoon Karna,  I recently spoke to patient about her request for the Dermology referral. She said that the [facility] Dermatology office she was referred to does not take her insurance and would like an alternative office.  Would you be able to assist with this request?

## 2023-09-17 NOTE — Telephone Encounter (Unsigned)
 Copied from CRM [id number]. Topic: Referral - Request for Referral >> Sep 17, 2023  3:42 PM Rosaria BRAVO wrote: Pt returned call, please advise

## 2023-09-24 DIAGNOSIS — M47816 Spondylosis without myelopathy or radiculopathy, lumbar region: Secondary | ICD-10-CM | POA: Diagnosis not present

## 2023-09-30 DIAGNOSIS — L249 Irritant contact dermatitis, unspecified cause: Secondary | ICD-10-CM | POA: Diagnosis not present

## 2023-09-30 DIAGNOSIS — L570 Actinic keratosis: Secondary | ICD-10-CM | POA: Diagnosis not present

## 2023-10-07 ENCOUNTER — Other Ambulatory Visit: Payer: Self-pay | Admitting: Internal Medicine

## 2023-10-07 DIAGNOSIS — R1310 Dysphagia, unspecified: Secondary | ICD-10-CM

## 2023-10-09 NOTE — Telephone Encounter (Signed)
 Requested Prescriptions  Pending Prescriptions Disp Refills   omeprazole  (PRILOSEC ) 20 MG capsule [Pharmacy Med Name: OMEPRAZOLE  20MG  CAPSULES] 180 capsule 0    Sig: TAKE 1 CAPSULE(20 MG) BY MOUTH TWICE DAILY BEFORE A MEAL     Gastroenterology: Proton Pump Inhibitors Passed - 10/09/2023 12:11 PM      Passed - Valid encounter within last 12 months    Recent Outpatient Visits           3 months ago Annual physical exam   [place] [facility] at [facility], Leita DEL, MD   5 months ago Acute exacerbation of chronic obstructive airways disease [facility])   [place] [facility] at [facility], Leita DEL, MD

## 2023-10-17 ENCOUNTER — Telehealth: Payer: Self-pay | Admitting: Internal Medicine

## 2023-10-17 NOTE — Telephone Encounter (Signed)
 Copied from CRM #[id number]. Topic: Medicare AWV >> Oct 17, 2023  2:47 PM Nathanel DEL wrote: Reason for CRM: Unable to LVM 10/17/23 to change AWV appt from in person to virtual/tele due to [facility] is working remote appts 10/17/2023  Nathanel Paschal; Care Guide Ambulatory Clinical Support [facility] Direct Dial: [phone]

## 2023-11-03 ENCOUNTER — Other Ambulatory Visit: Payer: Self-pay | Admitting: Internal Medicine

## 2023-11-03 DIAGNOSIS — J439 Emphysema, unspecified: Secondary | ICD-10-CM

## 2023-11-05 ENCOUNTER — Encounter: Payer: Self-pay | Admitting: Internal Medicine

## 2023-11-05 ENCOUNTER — Ambulatory Visit (INDEPENDENT_AMBULATORY_CARE_PROVIDER_SITE_OTHER): Admitting: Internal Medicine

## 2023-11-05 VITALS — BP 120/76 | HR 92 | Ht 65.0 in | Wt 114.0 lb

## 2023-11-05 DIAGNOSIS — R1312 Dysphagia, oropharyngeal phase: Secondary | ICD-10-CM

## 2023-11-05 DIAGNOSIS — Z7984 Long term (current) use of oral hypoglycemic drugs: Secondary | ICD-10-CM | POA: Diagnosis not present

## 2023-11-05 DIAGNOSIS — E118 Type 2 diabetes mellitus with unspecified complications: Secondary | ICD-10-CM

## 2023-11-05 LAB — POCT GLYCOSYLATED HEMOGLOBIN (HGB A1C): Hemoglobin A1C: 5.2 % (ref 4.0–5.6)

## 2023-11-05 MED ORDER — COVID-19 MRNA VAC-TRIS(PFIZER) 30 MCG/0.3ML IM SUSY: 0.3000 mL | PREFILLED_SYRINGE | Freq: Once | INTRAMUSCULAR | 0 refills | Status: AC

## 2023-11-05 NOTE — Assessment & Plan Note (Addendum)
 Blood sugars have been stable.  No hypoglycemic events since last visit. Currently medications are Amaryl , Januvia  and MTF. Last visit medical regimen changes were to resume Amaryl  1 mg daily.  She has cut out much of her diet and has lost 10 lbs since March. Lab Results  Component Value Date   HGBA1C 7.8 (H) 07/05/2023  A1C today = 5.2. recommend liberalizing her diet but continue the same medications. She needs to liberalize her diet - has cut back too far on carbohydrates and fats and can't eat much meat.

## 2023-11-05 NOTE — Assessment & Plan Note (Signed)
 Ongoing for some time per patient but getting more problematic. She eats very slowly and avoids almost all meats other than chopped chicken. She has stopped eating eggs due to cholesterol concerns. Continue omeprazole  bid and will order Barium Swallow.

## 2023-11-05 NOTE — Telephone Encounter (Signed)
 Requested Prescriptions  Pending Prescriptions Disp Refills   fluticasone -salmeterol (WIXELA INHUB) 250-50 MCG/ACT AEPB [Pharmacy Med Name: WIXELA INHUB DISKUS 250/50MCG 60S] 60 each 0    Sig: INHALE 1 PUFF INTO THE LUNGS TWICE DAILY     Pulmonology:  Combination Products Passed - 11/05/2023  9:17 AM      Passed - Valid encounter within last 12 months    Recent Outpatient Visits           4 months ago Annual physical exam   [place] [facility] at [facility], Leita DEL, MD   6 months ago Acute exacerbation of chronic obstructive airways disease [facility])   [place] [facility] at [facility], Leita DEL, MD

## 2023-11-05 NOTE — Progress Notes (Signed)
 Date:  11/05/2023   Name:  Hannah [name]   DOB:  18-Jul-1955   MRN:  [id number]   Chief Complaint: Diabetes  Diabetes She presents for her follow-up diabetic visit. She has type 2 diabetes mellitus. Pertinent negatives for hypoglycemia include no headaches or tremors. Pertinent negatives for diabetes include no chest pain, no fatigue, no polydipsia and no polyuria. Current diabetic treatments: Amaryl , Januvia , MTF.   Dysphagia - long standing but worsening.  Avoids almost all meats.  Taking omeprazole  bid with some benefit.  Review of Systems  Constitutional:  Negative for appetite change, fatigue, fever and unexpected weight change.  HENT:  Negative for tinnitus and trouble swallowing.   Eyes:  Negative for visual disturbance.  Respiratory:  Negative for cough, chest tightness and shortness of breath.   Cardiovascular:  Negative for chest pain, palpitations and leg swelling.  Gastrointestinal:  Negative for abdominal pain.  Endocrine: Negative for polydipsia and polyuria.  Genitourinary:  Negative for dysuria and hematuria.  Musculoskeletal:  Negative for arthralgias.  Neurological:  Negative for tremors, numbness and headaches.  Psychiatric/Behavioral:  Negative for dysphoric mood.      Lab Results  Component Value Date   NA 141 07/05/2023   K 4.3 07/05/2023   CO2 23 07/05/2023   GLUCOSE 91 07/05/2023   BUN 7 (L) 07/05/2023   CREATININE 0.66 07/05/2023   CALCIUM  9.7 07/05/2023   EGFR 96 07/05/2023   GFRNONAA 98 11/25/2019   Lab Results  Component Value Date   CHOL 183 07/05/2023   HDL 64 07/05/2023   LDLCALC 96 07/05/2023   TRIG 131 07/05/2023   CHOLHDL 2.9 07/05/2023   Lab Results  Component Value Date   TSH 3.280 07/05/2023   Lab Results  Component Value Date   HGBA1C 7.8 (H) 07/05/2023   Lab Results  Component Value Date   WBC 7.9 07/05/2023   HGB 15.1 07/05/2023   HCT 46.6 07/05/2023   MCV 95 07/05/2023   PLT 239 07/05/2023   Lab Results   Component Value Date   ALT 13 07/05/2023   AST 14 07/05/2023   ALKPHOS 91 07/05/2023   BILITOT 0.3 07/05/2023   No results found for: MARIEN BOLLS, VD25OH   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   Tobacco use disorder, mild, 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   Degenerative joint disease involving multiple joints 09/28/2013    Allergies  Allergen Reactions   Levofloxacin  Nausea And Vomiting    Past Surgical History:  Procedure Laterality Date   COLONOSCOPY WITH PROPOFOL  N/A 03/05/2016   Procedure: COLONOSCOPY WITH PROPOFOL ;  Surgeon: Rogelia Copping, MD;  Location[facility];  Service: Endoscopy;  Laterality: N/A;   COLONOSCOPY WITH PROPOFOL  N/A 09/18/2021   Procedure: COLONOSCOPY WITH PROPOFOL ;  Surgeon: Copping Rogelia, MD;  Location: [facility];  Service: Endoscopy;  Laterality: N/A;   ESOPHAGEAL DILATION N/A 12/29/2021   Procedure: ESOPHAGEAL DILATION;  Surgeon: Copping Rogelia, MD;  Location: [facility];  Service: Endoscopy;  Laterality: N/A;   ESOPHAGOGASTRODUODENOSCOPY (  EGD) WITH PROPOFOL  N/A 12/29/2021   Procedure: ESOPHAGOGASTRODUODENOSCOPY (EGD) WITH PROPOFOL ;  Surgeon: Jinny Carmine, MD;  Location: [facility];  Service: Endoscopy;  Laterality: N/A;  Diabetic   HIP FRACTURE SURGERY Right 03/2013   INNER EAR SURGERY Bilateral    REPLACEMENT TOTAL KNEE Right 02/2015   SHOULDER SURGERY Bilateral    rotator cuff   TOTAL KNEE  ARTHROPLASTY Left 06/05/2022    Social History   Tobacco Use   Smoking status: Former    Current packs/day: 0.00    Average packs/day: 0.5 packs/day for 25.0 years (12.5 ttl pk-yrs)    Types: Cigarettes    Start date: 03/06/1991    Quit date: 03/05/2016    Years since quitting: 7.6   Smokeless tobacco: Never  Vaping Use   Vaping status: Never Used  Substance Use Topics   Alcohol use: No   Drug use: No     Medication list has been reviewed and updated.  Current Meds  Medication Sig   albuterol  (VENTOLIN  HFA) 108 (90 Base) MCG/ACT inhaler Inhale 2 puffs into the lungs every 4 (four) hours as needed for wheezing or shortness of breath.   alendronate  (FOSAMAX ) 70 MG tablet TAKE 1 TABLET BY MOUTH EVERY 7 DAYS WITH A FULL GLASS OF WATER  AND ON AN EMPTY STOMACH. DO NOT EAT OR DRINK OR RECLINE FOR 30 MINUTES AFTER DOSING   atorvastatin  (LIPITOR) 20 MG tablet Take 1 tablet (20 mg total) by mouth daily.   Blood Glucose Monitoring Suppl (ONE TOUCH ULTRA 2) w/Device KIT SMARTSIG:1 Each Via Meter As Directed   Blood Glucose Monitoring Suppl DEVI 1 each by Does not apply route in the morning, at noon, and at bedtime. May substitute to any manufacturer covered by patient's insurance.   chlorzoxazone (PARAFON) 500 MG tablet Take 1 tablet by mouth 3 (three) times daily as needed.   COVID-19 mRNA vaccine, Pfizer, (COMIRNATY) syringe Inject 0.3 mLs into the muscle once for 1 dose.   diclofenac Sodium (VOLTAREN) 1 % GEL Apply 4g 4 times a day by topical route as directed.   Flavocoxid 500 MG CAPS Take by oral route twice per day.   fluticasone  (FLONASE ) 50 MCG/ACT nasal spray SHAKE LIQUID AND USE 2 SPRAYS IN EACH NOSTRIL DAILY   fluticasone -salmeterol (WIXELA INHUB) 250-50 MCG/ACT AEPB INHALE 1 PUFF INTO THE LUNGS TWICE DAILY   gabapentin  (NEURONTIN ) 600 MG tablet Take 1 tablet (600 mg total) by mouth 3 (three) times daily. (Patient taking differently: Take 600 mg by mouth in the morning, at noon, in  the evening, and at bedtime. Patient takes 2 qam and 2 qpm)   glimepiride  (AMARYL ) 2 MG tablet Take 1 tablet (2 mg total) by mouth daily with breakfast. (Patient taking differently: Take 1 mg by mouth daily with breakfast.)   glucose blood (ONETOUCH ULTRA) test strip USE TO TEST BLOOD SUGAR TWICE DAILY   JANUVIA  100 MG tablet TAKE 1 TABLET(100 MG) BY MOUTH DAILY   Lancets (ONETOUCH DELICA PLUS LANCET30G) MISC Use up to 4 times daily to test blood sugar as needed   metFORMIN  (GLUCOPHAGE -XR) 500 MG 24 hr tablet Take 2 tablets (1,000 mg total) by mouth 2 (two) times daily with a meal.   omeprazole  (PRILOSEC ) 20 MG capsule TAKE 1 CAPSULE(20 MG) BY MOUTH TWICE DAILY BEFORE A MEAL   oxyCODONE -acetaminophen  (PERCOCET) 10-325 MG tablet Take 1 tablet by mouth every 4 (four) hours as needed.   pramipexole (MIRAPEX) 0.125 MG tablet Mirapex 0.125 mg tablet  1  to 2 in pm for RLS   tiZANidine (ZANAFLEX) 4 MG tablet Take 4 mg by mouth every 6 (six) hours as needed.        11/05/2023    9:58 AM 07/05/2023    9:39 AM 03/04/2023    1:52 PM 10/31/2022    1:12 PM  GAD 7 : Generalized Anxiety Score  Nervous, Anxious, on Edge 1 0 0 0  Control/stop worrying 1 0 0 0  Worry too much - different things 1 0 0 0  Trouble relaxing 0 1 0 0  Restless 0 0 0 0  Easily annoyed or irritable 3 1 0 0  Afraid - awful might happen 0 0 0 0  Total GAD 7 Score 6 2 0 0  Anxiety Difficulty Somewhat difficult Somewhat difficult Not difficult at all Not difficult at all       11/05/2023    9:58 AM 07/05/2023    9:39 AM 03/04/2023    1:52 PM  Depression screen PHQ 2/9  Decreased Interest 0 1 0  Down, Depressed, Hopeless 0 0 0  PHQ - 2 Score 0 1 0  Altered sleeping  1 0  Tired, decreased energy  1 0  Change in appetite  1 0  Feeling bad or failure about yourself   0 0  Trouble concentrating  0 0  Moving slowly or fidgety/restless  0 0  Suicidal thoughts  0 0  PHQ-9 Score  4 0  Difficult doing work/chores  Somewhat  difficult Not difficult at all    BP Readings from Last 3 Encounters:  11/05/23 120/76  07/05/23 (!) 142/72  04/23/23 118/72    Physical Exam Vitals and nursing note reviewed.  Constitutional:      General: She is not in acute distress.    Appearance: She is well-developed.  HENT:     Head: Normocephalic and atraumatic.  Pulmonary:     Effort: Pulmonary effort is normal. No respiratory distress.  Skin:    General: Skin is warm and dry.     Findings: No rash.  Neurological:     Mental Status: She is alert and oriented to person, place, and time.  Psychiatric:        Mood and Affect: Mood normal.        Behavior: Behavior normal.     Wt Readings from Last 3 Encounters:  11/05/23 114 lb (51.7 kg)  07/05/23 117 lb 8 oz (53.3 kg)  04/23/23 124 lb 2 oz (56.3 kg)    BP 120/76   Pulse 92   Ht 5' 5 (1.651 m)   Wt 114 lb (51.7 kg)   SpO2 96%   BMI 18.97 kg/m   Assessment and Plan:  Problem List Items Addressed This Visit       Unprioritized   Dysphagia (Chronic)   Ongoing for some time per patient but getting more problematic. She eats very slowly and avoids almost all meats other than chopped chicken. She has stopped eating eggs due to cholesterol concerns. Continue omeprazole  bid and will order Barium Swallow.      Relevant Orders   DG ESOPHAGUS W SINGLE CM (SOL OR THIN BA)   Type II diabetes mellitus with complication (HCC) - Primary (Chronic)   Blood sugars have been stable.  No hypoglycemic events since last visit. Currently medications are Amaryl , Januvia  and MTF. Last visit medical regimen changes were to resume Amaryl  1 mg daily.  She has cut out much of her diet and  has lost 10 lbs since March. Lab Results  Component Value Date   HGBA1C 7.8 (H) 07/05/2023  A1C today = 5.2. recommend liberalizing her diet but continue the same medications. She needs to liberalize her diet - has cut back too far on carbohydrates and fats and can't eat much meat.        Relevant Orders   POCT HgB A1C   Other Visit Diagnoses       Long term current use of oral hypoglycemic drug           Return in about 3 months (around 02/04/2024) for DM.    Leita HILARIO Adie, MD [facility] [place]

## 2023-11-07 ENCOUNTER — Ambulatory Visit: Admitting: Emergency Medicine

## 2023-11-07 ENCOUNTER — Other Ambulatory Visit: Payer: Self-pay | Admitting: Internal Medicine

## 2023-11-07 VITALS — Ht 65.0 in | Wt 114.0 lb

## 2023-11-07 DIAGNOSIS — E118 Type 2 diabetes mellitus with unspecified complications: Secondary | ICD-10-CM

## 2023-11-07 DIAGNOSIS — Z0000 Encounter for general adult medical examination without abnormal findings: Secondary | ICD-10-CM

## 2023-11-07 DIAGNOSIS — E1169 Type 2 diabetes mellitus with other specified complication: Secondary | ICD-10-CM

## 2023-11-07 DIAGNOSIS — R1310 Dysphagia, unspecified: Secondary | ICD-10-CM

## 2023-11-07 NOTE — Telephone Encounter (Signed)
 Requested Prescriptions  Pending Prescriptions Disp Refills   atorvastatin  (LIPITOR) 20 MG tablet [Pharmacy Med Name: ATORVASTATIN  20MG  TABLETS] 90 tablet 1    Sig: TAKE 1 TABLET(20 MG) BY MOUTH DAILY     Cardiovascular:  Antilipid - Statins Failed - 11/07/2023  4:36 PM      Failed - Lipid Panel in normal range within the last 12 months    Cholesterol, Total  Date Value Ref Range Status  07/05/2023 183 100 - 199 mg/dL Final   LDL Chol Calc (NIH)  Date Value Ref Range Status  07/05/2023 96 0 - 99 mg/dL Final   HDL  Date Value Ref Range Status  07/05/2023 64 >39 mg/dL Final   Triglycerides  Date Value Ref Range Status  07/05/2023 131 0 - 149 mg/dL Final         Passed - Patient is not pregnant      Passed - Valid encounter within last 12 months    Recent Outpatient Visits           2 days ago Type II diabetes mellitus with complication (HCC)   [place] [facility] at [facility], Leita DEL, MD   4 months ago Annual physical exam   [place] [facility] at [facility], Leita DEL, MD   6 months ago Acute exacerbation of chronic obstructive airways disease [facility])   [place] [facility] at [facility], Leita DEL, MD               JANUVIA  100 MG tablet [Pharmacy Med Name: JANUVIA  100MG  TABLETS] 100 tablet 1    Sig: TAKE 1 TABLET(100 MG) BY MOUTH DAILY     Endocrinology:  Diabetes - DPP-4 Inhibitors Passed - 11/07/2023  4:36 PM      Passed - HBA1C is between 0 and 7.9 and within 180 days    Hemoglobin A1C  Date Value Ref Range Status  11/05/2023 5.2 4.0 - 5.6 % Final  12/19/2020 6.6  Final   Hgb A1c MFr Bld  Date Value Ref Range Status  07/05/2023 7.8 (H) 4.8 - 5.6 % Final    Comment:             Prediabetes: 5.7 - 6.4          Diabetes: >6.4          Glycemic control for adults with diabetes: <7.0          Passed - Cr in normal range and within 360 days     Creatinine  Date Value Ref Range Status  03/31/2013 0.62 0.60 - 1.30 mg/dL Final   Creatinine, Ser  Date Value Ref Range Status  07/05/2023 0.66 0.57 - 1.00 mg/dL Final         Passed - Valid encounter within last 6 months    Recent Outpatient Visits           2 days ago Type II diabetes mellitus with complication [facility])   [place] [facility] at [facility], Leita DEL, MD   4 months ago Annual physical exam   [facility] at [facility], Leita DEL, MD   6 months ago Acute exacerbation of chronic obstructive airways disease [facility])    Primary Care & Sports Medicine at [facility], Leita DEL, MD  Refused Prescriptions Disp Refills   metFORMIN  (GLUCOPHAGE -XR) 500 MG 24 hr tablet [Pharmacy Med Name: METFORMIN  ER 500MG  24HR TABS] 360 tablet 1    Sig: TAKE 2 TABLETS(1000 MG) BY MOUTH TWICE DAILY WITH A MEAL     Endocrinology:  Diabetes - Biguanides Failed - 11/07/2023  4:36 PM      Failed - B12 Level in normal range and within 720 days    No results found for: VITAMINB12       Passed - Cr in normal range and within 360 days    Creatinine  Date Value Ref Range Status  03/31/2013 0.62 0.60 - 1.30 mg/dL Final   Creatinine, Ser  Date Value Ref Range Status  07/05/2023 0.66 0.57 - 1.00 mg/dL Final         Passed - HBA1C is between 0 and 7.9 and within 180 days    Hemoglobin A1C  Date Value Ref Range Status  11/05/2023 5.2 4.0 - 5.6 % Final  12/19/2020 6.6  Final   Hgb A1c MFr Bld  Date Value Ref Range Status  07/05/2023 7.8 (H) 4.8 - 5.6 % Final    Comment:             Prediabetes: 5.7 - 6.4          Diabetes: >6.4          Glycemic control for adults with diabetes: <7.0          Passed - eGFR in normal range and within 360 days    EGFR (African American)  Date Value Ref Range Status  03/31/2013 >60  Final   GFR calc Af Amer  Date Value Ref Range Status   11/25/2019 113 >59 mL/min/1.73 Final    Comment:    **[facility] currently reports eGFR in compliance with the current**   recommendations of the [employer]. [facility] will   update reporting as new guidelines are published from the NKF-ASN   Task force.    EGFR (Non-African Amer.)  Date Value Ref Range Status  03/31/2013 >60  Final    Comment:    eGFR values <17mL/min/1.73 m2 may be an indication of chronic kidney disease (CKD). Calculated eGFR is useful in patients with stable renal function. The eGFR calculation will not be reliable in acutely ill patients when serum creatinine is changing rapidly. It is not useful in  patients on dialysis. The eGFR calculation may not be applicable to patients at the low and high extremes of body sizes, pregnant women, and vegetarians.    GFR calc non Af Amer  Date Value Ref Range Status  11/25/2019 98 >59 mL/min/1.73 Final   eGFR  Date Value Ref Range Status  07/05/2023 96 >59 mL/min/1.73 Final         Passed - Valid encounter within last 6 months    Recent Outpatient Visits           2 days ago Type II diabetes mellitus with complication [facility])   [place] [facility] at [facility], Leita DEL, MD   4 months ago Annual physical exam   [facility] at [facility], Leita DEL, MD   6 months ago Acute exacerbation of chronic obstructive airways disease [facility])   [place] [facility] at [facility], Leita DEL, MD              Passed - CBC within normal limits and  completed in the last 12 months    WBC  Date Value Ref Range Status  07/05/2023 7.9 3.4 - 10.8 x10E3/uL Final  07/21/2018 11.7 (H) 4.0 - 10.5 K/uL Final   RBC  Date Value Ref Range Status  07/05/2023 4.90 3.77 - 5.28 x10E6/uL Final  07/21/2018 4.63 3.87 - 5.11 MIL/uL Final   Hemoglobin  Date Value Ref Range Status  07/05/2023 15.1 11.1 - 15.9 g/dL  Final   Hematocrit  Date Value Ref Range Status  07/05/2023 46.6 34.0 - 46.6 % Final   MCHC  Date Value Ref Range Status  07/05/2023 32.4 31.5 - 35.7 g/dL Final  93/98/7979 66.3 30.0 - 36.0 g/dL Final   [facility]  Date Value Ref Range Status  07/05/2023 30.8 26.6 - 33.0 pg Final  07/21/2018 31.7 26.0 - 34.0 pg Final   MCV  Date Value Ref Range Status  07/05/2023 95 79 - 97 fL Final  03/31/2013 96 80 - 100 fL Final   No results found for: PLTCOUNTKUC, LABPLAT, POCPLA RDW  Date Value Ref Range Status  07/05/2023 13.2 11.7 - 15.4 % Final  03/31/2013 12.8 11.5 - 14.5 % Final          omeprazole  (PRILOSEC ) 20 MG capsule [Pharmacy Med Name: OMEPRAZOLE  20MG  CAPSULES] 180 capsule 0    Sig: TAKE 1 CAPSULE(20 MG) BY MOUTH TWICE DAILY BEFORE A MEAL     Gastroenterology: Proton Pump Inhibitors Passed - 11/07/2023  4:36 PM      Passed - Valid encounter within last 12 months    Recent Outpatient Visits           2 days ago Type II diabetes mellitus with complication [facility])   [place] [facility] at [facility], Leita DEL, MD   4 months ago Annual physical exam   [facility] at [facility], Leita DEL, MD   6 months ago Acute exacerbation of chronic obstructive airways disease [facility])   [place] [facility] at [facility], Leita DEL, MD

## 2023-11-07 NOTE — Patient Instructions (Signed)
 [name],  Thank you for taking the time for your Medicare Wellness Visit. I appreciate your continued commitment to your health goals. Please review the care plan we discussed, and feel free to reach out if I can assist you further.  Medicare recommends these wellness visits once per year to help you and your care team stay ahead of potential health issues. These visits are designed to focus on prevention, allowing your provider to concentrate on managing your acute and chronic conditions during your regular appointments.  Please note that Annual Wellness Visits do not include a physical exam. Some assessments may be limited, especially if the visit was conducted virtually. If needed, we may recommend a separate in-person follow-up with your provider.  Ongoing Care Seeing your primary care provider every 3 to 6 months helps us  monitor your health and provide consistent, personalized care.   Referrals If a referral was made during today's visit and you haven't received any updates within two weeks, please contact the referred provider directly to check on the status.  Recommended Screenings:  Get the flu, covid and shingles vaccines at your convenience. Schedule a diabetic eye exam. This should be done every year. I have included a list of eye doctors in the area.   Health Maintenance  Topic Date Due   Zoster (Shingles) Vaccine (1 of 2) Never done   COVID-19 Vaccine (3 - Pfizer risk series) 07/08/2019   Eye exam for diabetics  07/31/2023   DEXA scan (bone density measurement)  10/31/2023   Flu Shot  05/19/2024*   Breast Cancer Screening  03/17/2024   Hemoglobin A1C  05/04/2024   Yearly kidney function blood test for diabetes  07/04/2024   Yearly kidney health urinalysis for diabetes  07/04/2024   Complete foot exam   07/04/2024   Medicare Annual Wellness Visit  11/06/2024   Colon Cancer Screening  09/18/2028   DTaP/Tdap/Td vaccine (2 - Td or Tdap) 11/24/2029   Pneumococcal  Vaccine for age over 67  Completed   Hepatitis C Screening  Completed   HPV Vaccine  Aged Out   Meningitis B Vaccine  Aged Out  *Topic was postponed. The date shown is not the original due date.       11/07/2023    2:30 PM  Advanced Directives  Does Patient Have a Medical Advance Directive? No  Would patient like information on creating a medical advance directive? Yes (MAU/Ambulatory/Procedural Areas - Information given)   Advance Care Planning is important because it: Ensures you receive medical care that aligns with your values, goals, and preferences. Provides guidance to your family and loved ones, reducing the emotional burden of decision-making during critical moments.  Vision: Annual vision screenings are recommended for early detection of glaucoma, cataracts, and diabetic retinopathy. These exams can also reveal signs of chronic conditions such as diabetes and high blood pressure.  There are several Eye Doctors in your area. Here are a few that usually accept all insurance types:  [facility] [address] [place], [place] [address] Phone: [phone]  [facility] [address] [place], [place] [address] Phone: [phone]  [facility] [address] [place], [place] [address] Phone: [phone]  [facility] [address] [place], [place] [address] Phone: [phone]  [facility] [address] [place], [place] [address] Phone: [phone]  [facility] [address] [place], [place] [address] Phone: [phone]  Please let us  know if you require a referral for an  eye exam appointment. Thank you!   Dental: Annual dental screenings help detect early signs of oral cancer, gum disease, and other conditions linked to overall health, including heart disease and diabetes.  Please see the attached documents for additional preventive care recommendations.   Fall Prevention in the Home, Adult Falls can cause injuries and affect people of  all ages. There are many simple things that you can do to make your home safe and to help prevent falls. If you need it, ask for help making these changes. What actions can I take to prevent falls? General information Use good lighting in all rooms. Make sure to: Replace any light bulbs that burn out. Turn on lights if it is dark and use night-lights. Keep items that you use often in easy-to-reach places. Lower the shelves around your home if needed. Move furniture so that there are clear paths around it. Do not keep throw rugs or other things on the floor that can make you trip. If any of your floors are uneven, fix them. Add color or contrast paint or tape to clearly mark and help you see: Grab bars or handrails. First and last steps of staircases. Where the edge of each step is. If you use a ladder or stepladder: Make sure that it is fully opened. Do not climb a closed ladder. Make sure the sides of the ladder are locked in place. Have someone hold the ladder while you use it. Know where your pets are as you move through your home. What can I do in the bathroom?     Keep the floor dry. Clean up any water  that is on the floor right away. Remove soap buildup in the bathtub or shower. Buildup makes bathtubs and showers slippery. Use non-skid mats or decals on the floor of the bathtub or shower. Attach bath mats securely with double-sided, non-slip rug tape. If you need to sit down while you are in the shower, use a non-slip stool. Install grab bars by the toilet and in the bathtub and shower. Do not use towel bars as grab bars. What can I do in the bedroom? Make sure that you have a light by your bed that is easy to reach. Do not use any sheets or blankets on your bed that hang to the floor. Have a firm bench or chair with side arms that you can use for support when you get dressed. What can I do in the kitchen? Clean up any spills right away. If you need to reach something above  you, use a sturdy step stool that has a grab bar. Keep electrical cables out of the way. Do not use floor polish or wax that makes floors slippery. What can I do with my stairs? Do not leave anything on the stairs. Make sure that you have a light switch at the top and the bottom of the stairs. Have them installed if you do not have them. Make sure that there are handrails on both sides of the stairs. Fix handrails that are broken or loose. Make sure that handrails are as long as the staircases. Install non-slip stair treads on all stairs in your home if they do not have carpet. Avoid having throw rugs at the top or bottom of stairs, or secure the rugs with carpet tape to prevent them from moving. Choose a carpet design that does not hide the edge of steps on the stairs. Make sure that carpet is firmly attached to the stairs. Fix any  carpet that is loose or worn. What can I do on the outside of my home? Use bright outdoor lighting. Repair the edges of walkways and driveways and fix any cracks. Clear paths of anything that can make you trip, such as tools or rocks. Add color or contrast paint or tape to clearly mark and help you see high doorway thresholds. Trim any bushes or trees on the main path into your home. Check that handrails are securely fastened and in good repair. Both sides of all steps should have handrails. Install guardrails along the edges of any raised decks or porches. Have leaves, snow, and ice cleared regularly. Use sand, salt, or ice melt on walkways during winter months if you live where there is ice and snow. In the garage, clean up any spills right away, including grease or oil spills. What other actions can I take? Review your medicines with your health care provider. Some medicines can make you confused or feel dizzy. This can increase your chance of falling. Wear closed-toe shoes that fit well and support your feet. Wear shoes that have rubber soles and low heels. Use  a cane, walker, scooter, or crutches that help you move around if needed. Talk with your provider about other ways that you can decrease your risk of falls. This may include seeing a physical therapist to learn to do exercises to improve movement and strength. Where to find more information Centers for Disease Control and Prevention, [employer]: TonerPromos.no [employer] on Aging: BaseRingTones.pl National Institute on Aging: BaseRingTones.pl Contact a health care provider if: You are afraid of falling at home. You feel weak, drowsy, or dizzy at home. You fall at home. Get help right away if you: Lose consciousness or have trouble moving after a fall. Have a fall that causes a head injury. These symptoms may be an emergency. Get help right away. Call 911. Do not wait to see if the symptoms will go away. Do not drive yourself to the hospital. This information is not intended to replace advice given to you by your health care provider. Make sure you discuss any questions you have with your health care provider. Document Revised: 10/09/2021 Document Reviewed: 10/09/2021 Elsevier Patient Education  2024 [employer].

## 2023-11-07 NOTE — Progress Notes (Signed)
 Subjective:   Hannah [name] is a 68 y.o. who presents for a Medicare Wellness preventive visit.  As a reminder, Annual Wellness Visits don't include a physical exam, and some assessments may be limited, especially if this visit is performed virtually. We may recommend an in-person follow-up visit with your provider if needed.  Visit Complete: Virtual I connected with  Hannah [name] on 11/07/23 by a audio enabled telemedicine application and verified that I am speaking with the correct person using two identifiers.  Patient Location: Home  Provider Location: Home Office  I discussed the limitations of evaluation and management by telemedicine. The patient expressed understanding and agreed to proceed.  Vital Signs: Because this visit was a virtual/telehealth visit, some criteria may be missing or patient reported. Any vitals not documented were not able to be obtained and vitals that have been documented are patient reported.  VideoDeclined- This patient declined Librarian, academic. Therefore the visit was completed with audio only.  Persons Participating in Visit: Patient.  AWV Questionnaire: No: Patient Medicare AWV questionnaire was not completed prior to this visit.  Cardiac Risk Factors include: advanced age (>38men, >16 women);diabetes mellitus;dyslipidemia     Objective:    Today's Vitals   11/07/23 1410  Weight: 114 lb (51.7 kg)  Height: 5' 5 (1.651 m)  PainSc: 8    Body mass index is 18.97 kg/m.     11/07/2023    2:30 PM 10/31/2022    1:30 PM 12/29/2021    6:38 AM 07/24/2021    8:31 AM 01/04/2020   10:24 AM 12/29/2018   10:16 AM 07/21/2018    7:10 PM  Advanced Directives  Does Patient Have a Medical Advance Directive? No No No No No No No  Would patient like information on creating a medical advance directive? Yes (MAU/Ambulatory/Procedural Areas - Information given) No - Patient declined Yes (MAU/Ambulatory/Procedural Areas -  Information given) No - Patient declined Yes (MAU/Ambulatory/Procedural Areas - Information given) Yes (MAU/Ambulatory/Procedural Areas - Information given) No - Patient declined      Data saved with a previous flowsheet row definition    Current Medications (verified) Outpatient Encounter Medications as of 11/07/2023  Medication Sig   albuterol  (VENTOLIN  HFA) 108 (90 Base) MCG/ACT inhaler Inhale 2 puffs into the lungs every 4 (four) hours as needed for wheezing or shortness of breath.   alendronate  (FOSAMAX ) 70 MG tablet TAKE 1 TABLET BY MOUTH EVERY 7 DAYS WITH A FULL GLASS OF WATER  AND ON AN EMPTY STOMACH. DO NOT EAT OR DRINK OR RECLINE FOR 30 MINUTES AFTER DOSING   atorvastatin  (LIPITOR) 20 MG tablet Take 1 tablet (20 mg total) by mouth daily.   Blood Glucose Monitoring Suppl (ONE TOUCH ULTRA 2) w/Device KIT SMARTSIG:1 Each Via Meter As Directed   Blood Glucose Monitoring Suppl DEVI 1 each by Does not apply route in the morning, at noon, and at bedtime. May substitute to any manufacturer covered by patient's insurance.   chlorzoxazone (PARAFON) 500 MG tablet Take 1 tablet by mouth 3 (three) times daily as needed.   diclofenac Sodium (VOLTAREN) 1 % GEL Apply 4g 4 times a day by topical route as directed.   Flavocoxid 500 MG CAPS Take by oral route twice per day.   fluticasone  (FLONASE ) 50 MCG/ACT nasal spray SHAKE LIQUID AND USE 2 SPRAYS IN EACH NOSTRIL DAILY   fluticasone -salmeterol (WIXELA INHUB) 250-50 MCG/ACT AEPB INHALE 1 PUFF INTO THE LUNGS TWICE DAILY   gabapentin  (NEURONTIN ) 600 MG  tablet Take 1 tablet (600 mg total) by mouth 3 (three) times daily. (Patient taking differently: Take 600 mg by mouth 2 (two) times daily. Patient takes 2 qam and 2 qpm)   glimepiride  (AMARYL ) 2 MG tablet Take 1 tablet (2 mg total) by mouth daily with breakfast. (Patient taking differently: Take 1 mg by mouth daily with breakfast.)   glucose blood (ONETOUCH ULTRA) test strip USE TO TEST BLOOD SUGAR TWICE DAILY    JANUVIA  100 MG tablet TAKE 1 TABLET(100 MG) BY MOUTH DAILY   Lancets (ONETOUCH DELICA PLUS LANCET30G) MISC Use up to 4 times daily to test blood sugar as needed   metFORMIN  (GLUCOPHAGE -XR) 500 MG 24 hr tablet Take 2 tablets (1,000 mg total) by mouth 2 (two) times daily with a meal.   omeprazole  (PRILOSEC ) 20 MG capsule TAKE 1 CAPSULE(20 MG) BY MOUTH TWICE DAILY BEFORE A MEAL   oxyCODONE -acetaminophen  (PERCOCET) 10-325 MG tablet Take 1 tablet by mouth every 4 (four) hours as needed.   pramipexole (MIRAPEX) 0.125 MG tablet Mirapex 0.125 mg tablet  1 to 2 in pm for RLS   tiZANidine (ZANAFLEX) 4 MG tablet Take 4 mg by mouth every 6 (six) hours as needed.    No facility-administered encounter medications on file as of 11/07/2023.    Allergies (verified) Levofloxacin , Robitussin dm max day-night, and Vicks nyquil cold & flu night [dm-apap-cpm]   History: Past Medical History:  Diagnosis Date   Arthritis    Diabetes mellitus without complication (HCC)    Diet controlled gestational diabetes mellitus in puerperium    Emphysema of lung (HCC)    Familial spastic paraparesis (HCC)    GERD (gastroesophageal reflux disease)    Hyperlipidemia    Mood disorder (HCC) 04/29/2019   Past Surgical History:  Procedure Laterality Date   COLONOSCOPY WITH PROPOFOL  N/A 03/05/2016   Procedure: COLONOSCOPY WITH PROPOFOL ;  Surgeon: Rogelia Copping, MD;  Location: [facility];  Service: Endoscopy;  Laterality: N/A;   COLONOSCOPY WITH PROPOFOL  N/A 09/18/2021   Procedure: COLONOSCOPY WITH PROPOFOL ;  Surgeon: Copping Rogelia, MD;  Location: [facility];  Service: Endoscopy;  Laterality: N/A;   ESOPHAGEAL DILATION N/A 12/29/2021   Procedure: ESOPHAGEAL DILATION;  Surgeon: Copping Rogelia, MD;  Location: [facility];  Service: Endoscopy;  Laterality: N/A;   ESOPHAGOGASTRODUODENOSCOPY (EGD) WITH PROPOFOL  N/A 12/29/2021   Procedure: ESOPHAGOGASTRODUODENOSCOPY (EGD) WITH PROPOFOL ;  Surgeon: Copping Rogelia, MD;  Location: [facility];  Service: Endoscopy;  Laterality: N/A;  Diabetic   HIP FRACTURE SURGERY Right 03/2013   INNER EAR SURGERY Bilateral    REPLACEMENT TOTAL KNEE Right 02/2015   SHOULDER SURGERY Bilateral    rotator cuff   TOTAL KNEE ARTHROPLASTY Left 06/05/2022   Family History  Problem Relation Age of Onset   Cervical cancer Mother    Colon cancer Mother    Diabetes Father    Heart disease Father    Breast cancer Sister 7   Breast cancer Cousin 31       pat cousin   Diabetes Sister    Diabetes Sister    Congestive Heart Failure Paternal Grandmother    Diabetes Paternal Grandmother    Social History   Socioeconomic History   Marital status: Widowed    Spouse name: Not on file   Number of children: 2   Years of education: Not on file   Highest education level: Some college, no degree  Occupational History   Occupation: retired  Tobacco Use   Smoking status:  Former    Current packs/day: 0.00    Average packs/day: 0.5 packs/day for 25.0 years (12.5 ttl pk-yrs)    Types: Cigarettes    Start date: 03/06/1991    Quit date: 03/05/2016    Years since quitting: 7.6   Smokeless tobacco: Never  Vaping Use   Vaping status: Never Used  Substance and Sexual Activity   Alcohol use: No   Drug use: No   Sexual activity: Never  Other Topics Concern   Not on file  Social History Narrative   Pt's son lives with her; he is recovering from TBI due to motorcycle accident 09/10/19   Social Drivers of Health   Financial Resource Strain: Low Risk  (11/07/2023)   Overall Financial Resource Strain (CARDIA)    Difficulty of Paying Living Expenses: Not hard at all  Food Insecurity: No Food Insecurity (11/07/2023)   Hunger Vital Sign    Worried About Running Out of Food in the Last Year: Never true    Ran Out of Food in the Last Year: Never true  Transportation Needs: No Transportation Needs (11/07/2023)   PRAPARE - Administrator, Civil Service  (Medical): No    Lack of Transportation (Non-Medical): No  Physical Activity: Inactive (11/07/2023)   Exercise Vital Sign    Days of Exercise per Week: 0 days    Minutes of Exercise per Session: 0 min  Stress: No Stress Concern Present (11/07/2023)   [employer] of Occupational Health - Occupational Stress Questionnaire    Feeling of Stress: Not at all  Social Connections: Moderately Isolated (11/07/2023)   Social Connection and Isolation Panel    Frequency of Communication with Friends and Family: Three times a week    Frequency of Social Gatherings with Friends and Family: More than three times a week    Attends Religious Services: More than 4 times per year    Active Member of Clubs or Organizations: No    Attends Banker Meetings: Never    Marital Status: Widowed    Tobacco Counseling Counseling given: Not Answered    Clinical Intake:  Pre-visit preparation completed: Yes  Pain : 0-10 Pain Score: 8  Pain Type: Chronic pain Pain Location: Back Pain Descriptors / Indicators: Aching     BMI - recorded: 18.97 Nutritional Status: BMI <19  Underweight Nutritional Risks: None Diabetes: Yes CBG done?: No (FBS 126 per patient) Did pt. bring in CBG monitor from home?: No  Lab Results  Component Value Date   HGBA1C 5.2 11/05/2023   HGBA1C 7.8 (H) 07/05/2023   HGBA1C 7.6 (A) 03/04/2023     How often do you need to have someone help you when you read instructions, pamphlets, or other written materials from your doctor or pharmacy?: 1 - Never  Interpreter Needed?: No  Information entered by :: Vina Ned, CMA   Activities of Daily Living \    11/07/2023    2:14 PM  In your present state of health, do you have any difficulty performing the following activities:  Hearing? 0  Vision? 0  Difficulty concentrating or making decisions? 0  Walking or climbing stairs? 1  Comment uses a rollator  Dressing or bathing? 0  Doing errands, shopping? 0   Preparing Food and eating ? N  Using the Toilet? N  In the past six months, have you accidently leaked urine? Y  Comment wears depends  Do you have problems with loss of bowel control? Y  Comment wears depends  Managing your Medications? N  Managing your Finances? N  Housekeeping or managing your Housekeeping? N    Patient Care Team: Justus Leita DEL, MD as PCP - General (Internal Medicine) Jordan, Jimmy J, MD as Consulting Physician (Physical Medicine and Rehabilitation) Richie Sharper, MD as Referring Physician (Orthopedic Surgery) Maurie Russian (Optometry) Bowen, Konnie Dasen, PA-C (Orthopedic Surgery) Samie Almarie SAILOR, MD as Referring Physician (Physical Medicine and Rehabilitation) Jordan, Jimmy J, MD as Referring Physician (Physical Medicine and Rehabilitation)  I have updated your Care Teams any recent Medical Services you may have received from other providers in the past year.     Assessment:   This is a routine wellness examination for [place].  Hearing/Vision screen Hearing Screening - Comments:: Denies hearing loss  Vision Screening - Comments:: Needs DM eye exam, included list of eye doctors in AVS   Goals Addressed             This Visit's Progress    Patient Stated       Continue to control diabetes and have EGD and be able to keep food down       Depression Screen     11/07/2023    2:28 PM 11/05/2023    9:58 AM 07/05/2023    9:39 AM 03/04/2023    1:52 PM 10/31/2022    1:12 PM 06/27/2022    9:32 AM 04/18/2022    3:26 PM  PHQ 2/9 Scores  PHQ - 2 Score 0 0 1 0 0 3 0  PHQ- 9 Score 1  4 0 1 6 0    Fall Risk     11/07/2023    2:34 PM 11/05/2023    9:57 AM 07/05/2023    9:39 AM 04/23/2023   11:26 AM 03/04/2023    1:52 PM  Fall Risk   Falls in the past year? 1 1 0 0 0  Number falls in past yr: 1 1 0 0 0  Injury with Fall? 1 0 0 0 0  Risk for fall due to : History of fall(s);Impaired balance/gait;Orthopedic patient;Impaired mobility History of  fall(s) No Fall Risks No Fall Risks History of fall(s)  Follow up Falls evaluation completed;Education provided Falls evaluation completed Falls evaluation completed Falls evaluation completed Falls evaluation completed    MEDICARE RISK AT HOME:  Medicare Risk at Home Any stairs in or around the home?: Yes (also has a ramp) If so, are there any without handrails?: No Home free of loose throw rugs in walkways, pet beds, electrical cords, etc?: Yes Adequate lighting in your home to reduce risk of falls?: Yes Life alert?: No Use of a cane, walker or w/c?: Yes (rollator) Grab bars in the bathroom?: Yes Shower chair or bench in shower?: Yes Elevated toilet seat or a handicapped toilet?: No  TIMED UP AND GO:  Was the test performed?  No  Cognitive Function: 6CIT completed        11/07/2023    2:35 PM 10/31/2022    1:31 PM 01/04/2020   10:29 AM 12/29/2018   10:23 AM  6CIT Screen  What Year? 0 points 0 points 0 points 0 points  What month? 0 points 0 points 0 points 0 points  What time? 0 points 0 points 0 points 0 points  Count back from 20 0 points 0 points 0 points 0 points  Months in reverse 2 points 0 points 0 points 0 points  Repeat phrase 0 points 2 points 2 points 0 points  Total Score 2  points 2 points 2 points 0 points    Immunizations Immunization History  Administered Date(s) Administered   Fluad Quad(high Dose 65+) 02/16/2022   Fluad Trivalent(High Dose 65+) 10/31/2022   Influenza, Seasonal, Injecte, Preservative Fre 11/29/2011   Influenza,inj,Quad PF,6+ Mos 11/24/2014, 11/25/2015, 12/24/2016, 12/11/2017, 10/29/2018, 11/25/2019   Influenza,inj,quad, With Preservative 03/08/2017   Influenza-Unspecified 11/24/2014, 11/25/2015, 12/24/2016, 12/11/2017, 10/29/2018, 11/25/2019, 10/18/2020   PFIZER(Purple Top)SARS-COV-2 Vaccination 05/18/2019, 06/10/2019   PNEUMOCOCCAL CONJUGATE-20 09/16/2020   Pneumococcal Conjugate-13 11/24/2014   Pneumococcal Polysaccharide-23  05/20/2012   Tdap 11/25/2019    Screening Tests Health Maintenance  Topic Date Due   Zoster Vaccines- Shingrix  (1 of 2) Never done   COVID-19 Vaccine (3 - Pfizer risk series) 07/08/2019   OPHTHALMOLOGY EXAM  07/31/2023   DEXA SCAN  10/31/2023   Influenza Vaccine  05/19/2024 (Originally 09/20/2023)   Mammogram  03/17/2024   HEMOGLOBIN A1C  05/04/2024   Diabetic kidney evaluation - eGFR measurement  07/04/2024   Diabetic kidney evaluation - Urine ACR  07/04/2024   FOOT EXAM  07/04/2024   Medicare Annual Wellness (AWV)  11/06/2024   Colonoscopy  09/18/2028   DTaP/Tdap/Td (2 - Td or Tdap) 11/24/2029   Pneumococcal Vaccine: 50+ Years  Completed   Hepatitis C Screening  Completed   HPV VACCINES  Aged Out   Meningococcal B Vaccine  Aged Out    Health Maintenance Items Addressed: See Nurse Notes at the end of this note  Additional Screening:  Vision Screening: Recommended annual ophthalmology exams for early detection of glaucoma and other disorders of the eye. Is the patient up to date with their annual eye exam?  No  Who is the provider or what is the name of the office in which the patient attends annual eye exams? Included list of eye doctors in AVS  Dental Screening: Recommended annual dental exams for proper oral hygiene  Community Resource Referral / Chronic Care Management: CRR required this visit?  No   CCM required this visit?  No   Plan:    I have personally reviewed and noted the following in the patient's chart:   Medical and social history Use of alcohol, tobacco or illicit drugs  Current medications and supplements including opioid prescriptions. Patient is currently taking opioid prescriptions. Information provided to patient regarding non-opioid alternatives. Patient advised to discuss non-opioid treatment plan with their provider. Functional ability and status Nutritional status Physical activity Advanced directives List of other  physicians Hospitalizations, surgeries, and ER visits in previous 12 months Vitals Screenings to include cognitive, depression, and falls Referrals and appointments  In addition, I have reviewed and discussed with patient certain preventive protocols, quality metrics, and best practice recommendations. A written personalized care plan for preventive services as well as general preventive health recommendations were provided to patient.   Vina Ned, CMA   11/07/2023   After Visit Summary: (Mail) Due to this being a telephonic visit, the after visit summary with patients personalized plan was offered to patient via mail   Notes:  FBS 126 this morning per patient 6 CIT Score - 2 Needs DM eye exam, included a list of eye doctors in AVS Needs flu, covid and shingles vaccines (pharmacy) Patient states Orthopedics follows her DEXA scan Declined DM & Nutrition education referral

## 2023-11-08 ENCOUNTER — Ambulatory Visit
Admission: RE | Admit: 2023-11-08 | Discharge: 2023-11-08 | Disposition: A | Discharge status: Home / Self Care | Source: Ambulatory Visit | Attending: Internal Medicine | Admitting: Internal Medicine

## 2023-11-08 DIAGNOSIS — R1310 Dysphagia, unspecified: Secondary | ICD-10-CM | POA: Diagnosis not present

## 2023-11-08 DIAGNOSIS — R1312 Dysphagia, oropharyngeal phase: Secondary | ICD-10-CM | POA: Insufficient documentation

## 2023-11-08 DIAGNOSIS — K224 Dyskinesia of esophagus: Secondary | ICD-10-CM | POA: Diagnosis not present

## 2023-11-11 ENCOUNTER — Ambulatory Visit: Payer: Self-pay | Admitting: Internal Medicine

## 2023-11-11 ENCOUNTER — Other Ambulatory Visit: Payer: Self-pay | Admitting: Internal Medicine

## 2023-11-11 DIAGNOSIS — R1310 Dysphagia, unspecified: Secondary | ICD-10-CM

## 2023-11-11 NOTE — Progress Notes (Unsigned)
 Date:  11/11/2023   Name:  Hannah [name]   DOB:  [birth year]   MRN:  [id number]   Chief Complaint: No chief complaint on file.  HPI  Review of Systems   Lab Results  Component Value Date   NA 141 07/05/2023   K 4.3 07/05/2023   CO2 23 07/05/2023   GLUCOSE 91 07/05/2023   BUN 7 (L) 07/05/2023   CREATININE 0.66 07/05/2023   CALCIUM  9.7 07/05/2023   EGFR 96 07/05/2023   GFRNONAA 98 11/25/2019   Lab Results  Component Value Date   CHOL 183 07/05/2023   HDL 64 07/05/2023   LDLCALC 96 07/05/2023   TRIG 131 07/05/2023   CHOLHDL 2.9 07/05/2023   Lab Results  Component Value Date   TSH 3.280 07/05/2023   Lab Results  Component Value Date   HGBA1C 5.2 11/05/2023   Lab Results  Component Value Date   WBC 7.9 07/05/2023   HGB 15.1 07/05/2023   HCT 46.6 07/05/2023   MCV 95 07/05/2023   PLT 239 07/05/2023   Lab Results  Component Value Date   ALT 13 07/05/2023   AST 14 07/05/2023   ALKPHOS 91 07/05/2023   BILITOT 0.3 07/05/2023   No results found for: MARIEN BOLLS, VD25OH   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   Tobacco use disorder, mild, 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   Degenerative joint disease involving multiple joints 09/28/2013    Allergies  Allergen Reactions   Levofloxacin  Nausea And Vomiting   Robitussin Dm Max Day-Night     Dizziness, out of it   Vicks Nyquil Cold & Flu Night [Dm-Apap-Cpm]     Causes dizziness and out of  it    Past Surgical History:  Procedure Laterality Date   COLONOSCOPY WITH PROPOFOL  N/A 03/05/2016   Procedure: COLONOSCOPY WITH PROPOFOL ;  Surgeon: Rogelia Copping, MD;  Location: [facility];  Service: Endoscopy;  Laterality: N/A;   COLONOSCOPY WITH PROPOFOL  N/A 09/18/2021   Procedure: COLONOSCOPY WITH PROPOFOL ;  Surgeon: Copping Rogelia, MD;  Location: [facility];  Service: Endoscopy;  Laterality: N/A;   ESOPHAGEAL DILATION N/A 12/29/2021   Procedure: ESOPHAGEAL DILATION;  Surgeon: Copping Rogelia, MD;  Location: [facility];  Service: Endoscopy;  Laterality: N/A;   ESOPHAGOGASTRODUODENOSCOPY (EGD) WITH PROPOFOL  N/A 12/29/2021   Procedure: ESOPHAGOGASTRODUODENOSCOPY (EGD) WITH PROPOFOL ;  Surgeon: Copping Rogelia, MD;  Location: [facility];  Service: Endoscopy;  Laterality: N/A;  Diabetic   HIP FRACTURE SURGERY Right 03/2013   INNER EAR SURGERY Bilateral    REPLACEMENT TOTAL KNEE Right 02/2015   SHOULDER SURGERY Bilateral    rotator cuff   TOTAL KNEE ARTHROPLASTY Left 06/05/2022    Social History   Tobacco Use   Smoking status: Former    Current packs/day: 0.00    Average packs/day: 0.5 packs/day for 25.0 years (12.5 ttl pk-yrs)    Types: Cigarettes    Start date: 03/06/1991    Quit date: 03/05/2016    Years since quitting: 7.6   Smokeless  tobacco: Never  Vaping Use   Vaping status: Never Used  Substance Use Topics   Alcohol use: No   Drug use: No     Medication list has been reviewed and updated.  No outpatient medications have been marked as taking for the 11/11/23 encounter (Orders Only) with Justus Leita DEL, MD.       11/05/2023    9:58 AM 07/05/2023    9:39 AM 03/04/2023    1:52 PM 10/31/2022    1:12 PM  GAD 7 : Generalized Anxiety Score  Nervous, Anxious, on Edge 1 0 0 0  Control/stop worrying 1 0 0 0  Worry too much - different things 1 0 0 0  Trouble relaxing 0 1 0 0  Restless 0 0 0 0  Easily annoyed or irritable 3 1 0 0  Afraid -  awful might happen 0 0 0 0  Total GAD 7 Score 6 2 0 0  Anxiety Difficulty Somewhat difficult Somewhat difficult Not difficult at all Not difficult at all       11/07/2023    2:28 PM 11/05/2023    9:58 AM 07/05/2023    9:39 AM  Depression screen PHQ 2/9  Decreased Interest 0 0 1  Down, Depressed, Hopeless 0 0 0  PHQ - 2 Score 0 0 1  Altered sleeping 0  1  Tired, decreased energy 0  1  Change in appetite 1  1  Feeling bad or failure about yourself  0  0  Trouble concentrating 0  0  Moving slowly or fidgety/restless 0  0  Suicidal thoughts 0  0  PHQ-9 Score 1  4  Difficult doing work/chores Not difficult at all  Somewhat difficult    BP Readings from Last 3 Encounters:  11/05/23 120/76  07/05/23 (!) 142/72  04/23/23 118/72    Physical Exam  Wt Readings from Last 3 Encounters:  11/07/23 114 lb (51.7 kg)  11/05/23 114 lb (51.7 kg)  07/05/23 117 lb 8 oz (53.3 kg)    There were no vitals taken for this visit.  Assessment and Plan:  Problem List Items Addressed This Visit   None   No follow-ups on file.    Leita HILARIO Justus, MD [facility] [place]

## 2023-11-13 DIAGNOSIS — R634 Abnormal weight loss: Secondary | ICD-10-CM | POA: Diagnosis not present

## 2023-11-13 DIAGNOSIS — R1319 Other dysphagia: Secondary | ICD-10-CM | POA: Diagnosis not present

## 2023-11-13 DIAGNOSIS — M62838 Other muscle spasm: Secondary | ICD-10-CM | POA: Diagnosis not present

## 2023-11-13 DIAGNOSIS — Z8719 Personal history of other diseases of the digestive system: Secondary | ICD-10-CM | POA: Diagnosis not present

## 2023-11-13 DIAGNOSIS — K219 Gastro-esophageal reflux disease without esophagitis: Secondary | ICD-10-CM | POA: Diagnosis not present

## 2023-11-20 ENCOUNTER — Encounter: Payer: Self-pay | Admitting: Internal Medicine

## 2023-11-20 ENCOUNTER — Other Ambulatory Visit: Payer: Self-pay

## 2023-11-20 ENCOUNTER — Ambulatory Visit

## 2023-11-20 ENCOUNTER — Encounter
Admission: RE | Disposition: A | Discharge status: Home / Self Care | Payer: Self-pay | Source: Home / Self Care | Attending: Internal Medicine

## 2023-11-20 ENCOUNTER — Other Ambulatory Visit: Payer: Self-pay | Admitting: Internal Medicine

## 2023-11-20 ENCOUNTER — Ambulatory Visit
Admission: RE | Admit: 2023-11-20 | Discharge: 2023-11-20 | Disposition: A | Discharge status: Home / Self Care | Attending: Internal Medicine | Admitting: Internal Medicine

## 2023-11-20 DIAGNOSIS — K219 Gastro-esophageal reflux disease without esophagitis: Secondary | ICD-10-CM | POA: Insufficient documentation

## 2023-11-20 DIAGNOSIS — Z7984 Long term (current) use of oral hypoglycemic drugs: Secondary | ICD-10-CM | POA: Insufficient documentation

## 2023-11-20 DIAGNOSIS — Z681 Body mass index (BMI) 19 or less, adult: Secondary | ICD-10-CM | POA: Diagnosis not present

## 2023-11-20 DIAGNOSIS — K2970 Gastritis, unspecified, without bleeding: Secondary | ICD-10-CM | POA: Diagnosis not present

## 2023-11-20 DIAGNOSIS — E739 Lactose intolerance, unspecified: Secondary | ICD-10-CM | POA: Insufficient documentation

## 2023-11-20 DIAGNOSIS — K224 Dyskinesia of esophagus: Secondary | ICD-10-CM | POA: Insufficient documentation

## 2023-11-20 DIAGNOSIS — Z79899 Other long term (current) drug therapy: Secondary | ICD-10-CM | POA: Diagnosis not present

## 2023-11-20 DIAGNOSIS — E119 Type 2 diabetes mellitus without complications: Secondary | ICD-10-CM | POA: Insufficient documentation

## 2023-11-20 DIAGNOSIS — R634 Abnormal weight loss: Secondary | ICD-10-CM | POA: Insufficient documentation

## 2023-11-20 DIAGNOSIS — R1314 Dysphagia, pharyngoesophageal phase: Secondary | ICD-10-CM | POA: Diagnosis not present

## 2023-11-20 DIAGNOSIS — K2289 Other specified disease of esophagus: Secondary | ICD-10-CM | POA: Diagnosis not present

## 2023-11-20 DIAGNOSIS — J439 Emphysema, unspecified: Secondary | ICD-10-CM | POA: Insufficient documentation

## 2023-11-20 DIAGNOSIS — E785 Hyperlipidemia, unspecified: Secondary | ICD-10-CM | POA: Diagnosis not present

## 2023-11-20 HISTORY — PX: ESOPHAGOGASTRODUODENOSCOPY: SHX5428

## 2023-11-20 HISTORY — PX: MALONEY DILATION: SHX5535

## 2023-11-20 LAB — GLUCOSE, CAPILLARY: Glucose-Capillary: 133 mg/dL — ABNORMAL HIGH (ref 70–99)

## 2023-11-20 SURGERY — EGD (ESOPHAGOGASTRODUODENOSCOPY)
Anesthesia: General

## 2023-11-20 MED ORDER — SODIUM CHLORIDE 0.9 % IV SOLN: INTRAVENOUS | Status: DC

## 2023-11-20 MED ORDER — GLYCOPYRROLATE 0.2 MG/ML IJ SOLN
INTRAMUSCULAR | Status: AC
  Filled 2023-11-20: qty 1

## 2023-11-20 MED ORDER — PROPOFOL 10 MG/ML IV BOLUS
INTRAVENOUS | Status: DC | PRN
Start: 2023-11-20 — End: 2023-11-20
  Administered 2023-11-20: 50 mg via INTRAVENOUS
  Administered 2023-11-20: 30 mg via INTRAVENOUS
  Administered 2023-11-20: 70 mg via INTRAVENOUS

## 2023-11-20 MED ORDER — GLYCOPYRROLATE 0.2 MG/ML IJ SOLN
INTRAMUSCULAR | Status: DC | PRN
  Administered 2023-11-20: .2 mg via INTRAVENOUS

## 2023-11-20 MED ORDER — LIDOCAINE HCL (CARDIAC) PF 100 MG/5ML IV SOSY
PREFILLED_SYRINGE | INTRAVENOUS | Status: DC | PRN
  Administered 2023-11-20: 50 mg via INTRAVENOUS

## 2023-11-20 NOTE — Op Note (Signed)
 [facility] Gastroenterology Patient Name: Hannah [name] Procedure Date: 11/20/2023 9:20 AM MRN: [id number] Account #: [id number] Date of Birth: May 09, 1955 Admit Type: Outpatient Age: 68 Room: [facility] ENDO ROOM 2 Gender: Female Note Status: Finalized Instrument Name: Barnie GI Scope [id number] Procedure:             Upper GI endoscopy Indications:           Esophageal dysphagia, Suspected esophageal reflux,                         Suspected stricture of the esophagus Providers:             Ilynn Stauffer K. Aundria MD, MD Referring MD:          Leita Adie, MD (Referring MD) Medicines:             Propofol  per Anesthesia Complications:         No immediate complications. Estimated blood loss:                         Minimal. Procedure:             Pre-Anesthesia Assessment:                        - The risks and benefits of the procedure and the                         sedation options and risks were discussed with the                         patient. All questions were answered and informed                         consent was obtained.                        - Patient identification and proposed procedure were                         verified prior to the procedure by the nurse. The                         procedure was verified in the procedure room.                        - ASA Grade Assessment: III - A patient with severe                         systemic disease.                        - After reviewing the risks and benefits, the patient                         was deemed in satisfactory condition to undergo the                         procedure.                        After obtaining  informed consent, the endoscope was                         passed under direct vision. Throughout the procedure,                         the patient's blood pressure, pulse, and oxygen                         saturations were monitored continuously. The Endoscope                          was introduced through the mouth, and advanced to the                         third part of duodenum. The upper GI endoscopy was                         accomplished without difficulty. The patient tolerated                         the procedure well. Findings:      Normal mucosa was found in the entire esophagus. Biopsies were obtained       from the proximal and distal esophagus with cold forceps for histology       of suspected eosinophilic esophagitis.      Abnormal motility was noted at the lower esophageal sphincter. The       cricopharyngeus was abnormal. There is spasticity of the esophageal       body. The distal esophagus/lower esophageal sphincter is spastic, but       gives up passage to the endoscope. The scope was withdrawn. Dilation was       performed with a Maloney dilator with moderate resistance at 54 Fr.       Estimated blood loss: none.      The exam of the esophagus was otherwise normal.      Patchy mild inflammation characterized by congestion (edema) and       erythema was found in the gastric antrum.      The cardia and gastric fundus were normal on retroflexion.      The exam of the stomach was otherwise normal.      The examined duodenum was normal. Impression:            - Normal mucosa was found in the entire esophagus.                        - Abnormal esophageal motility. Dilated.                        - Gastritis.                        - Normal examined duodenum.                        - Biopsies were taken with a cold forceps for                         evaluation of eosinophilic esophagitis. Recommendation:        -  Patient has a contact number available for                         emergencies. The signs and symptoms of potential                         delayed complications were discussed with the patient.                         Return to normal activities tomorrow. Written                         discharge instructions were provided to the  patient.                        - Resume previous diet.                        - Continue present medications.                        - Monitor results to esophageal dilation                        - Perform ambulatory esophageal manometry at                         appointment to be scheduled.                        - Return to my office at the next available                         appointment.                        - The findings and recommendations were discussed with                         the patient.                        - Telephone GI office to schedule appointment. Procedure Code(s):     --- Professional ---                        [phone], Esophagogastroduodenoscopy, flexible,                         transoral; with biopsy, single or multiple                        [id number], Dilation of esophagus, by unguided sound or                         bougie, single or multiple passes Diagnosis Code(s):     --- Professional ---                        R13.14, Dysphagia, pharyngoesophageal phase                        K29.70,  Gastritis, unspecified, without bleeding                        K22.4, Dyskinesia of esophagus CPT copyright 2022 American Medical Association. All rights reserved. The codes documented in this report are preliminary and upon coder review may  be revised to meet current compliance requirements. Ladell MARLA Boss MD, MD 11/20/2023 9:42:23 AM This report has been signed electronically. Number of Addenda: 0 Note Initiated On: 11/20/2023 9:20 AM Estimated Blood Loss:  Estimated blood loss was minimal.      [facility]

## 2023-11-20 NOTE — Transfer of Care (Signed)
 Immediate Anesthesia Transfer of Care Note  Patient: Hannah [name]  Procedure(s) Performed: EGD (ESOPHAGOGASTRODUODENOSCOPY)  Patient Location: PACU  Anesthesia Type:MAC  Level of Consciousness: drowsy  Airway & Oxygen Therapy: Patient Spontanous Breathing  Post-op Assessment: Report given to RN and Post -op Vital signs reviewed and stable  Post vital signs: Reviewed and stable  Last Vitals:  Vitals Value Taken Time  BP 147/83 11/20/23 09:41  Temp 36 C 11/20/23 09:41  Pulse 82 11/20/23 09:42  Resp 11 11/20/23 09:42  SpO2 97 % 11/20/23 09:42  Vitals shown include unfiled device data.  Last Pain:  Vitals:   11/20/23 0941  TempSrc: Temporal  PainSc: Asleep         Complications: No notable events documented.

## 2023-11-20 NOTE — H&P (Signed)
 Outpatient short stay form Pre-procedure 11/20/2023 9:16 AM Hannah [name] K. Hannah [name], M.D.  Primary Physician: Leita Adie, M.D.  Reason for visit:    Diagnoses  History of esophageal stricture  Gastroesophageal reflux disease, unspecified whether esophagitis present  Esophageal dysphagia     History of present illness:   Had EGD 12/29/2021 by Dr. Jinny - Impression: - Benign-appearing esophageal stenosis. Dilated with a 15-16.5-18 mm balloon dilator was performed to 18 mm. The dilation site was examined and showed complete resolution of luminal narrowing. Biopsies were obtained in the  middle third of the esophagus with cold forceps for histology. (Benign tissue - squamous mucosa with spongiosis compatible with reflux). - Normal stomach. - Normal examined duodenum. - Biopsies were obtained in the middle third of the  esophagus. Recommendation: - Discharge patient to home. - Resume previous diet. - Continue present medications. - Await pathology results.  History of Present Illness Hannah [name] is a 68 year old female who presents with worsening dysphagia and significant weight loss. She was referred by her family doctor for evaluation of swallowing difficulty.  Dysphagia - Swallowing difficulties for the past five years, with notable worsening in the last six months - Sensation of food getting 'stuck' under the chest, above the navel - Difficulty primarily with meat, medications, bread, and rice - Episodes of regurgitation, especially when eating quickly or without sufficient liquid - No significant improvement in symptoms following endoscopy with biopsies performed a couple of years ago  Unintentional weight loss - Weight loss of approximately 30-35 pounds over the last year, with most of the loss in the past six months - Current weight is 112 pounds, previously 150-165 pounds - Weight loss attributed to fear of eating due to risk of food impaction and associated  embarrassment  Postprandial abdominal symptoms - Abdominal pain described as tightness and bloating after meals - Belching and burping after meals, which provides some relief - No nausea, hematemesis, or melena  Tobacco use - Occasional smoking, particularly during periods of stress - Quit regular smoking approximately one week ago  Dietary intolerance - Lactose intolerance - Difficulty tolerating nutritional supplements such as Ensure due to diarrhea    Current Facility-Administered Medications:    0.9 %  sodium chloride  infusion, , Intravenous, Continuous, Habib Kise K, MD, Last Rate: 20 mL/hr at 11/20/23 0849, New Bag at 11/20/23 0849  Medications Prior to Admission  Medication Sig Dispense Refill Last Dose/Taking   albuterol  (VENTOLIN  HFA) 108 (90 Base) MCG/ACT inhaler Inhale 2 puffs into the lungs every 4 (four) hours as needed for wheezing or shortness of breath. 18 g 5    alendronate  (FOSAMAX ) 70 MG tablet TAKE 1 TABLET BY MOUTH EVERY 7 DAYS WITH A FULL GLASS OF WATER  AND ON AN EMPTY STOMACH. DO NOT EAT OR DRINK OR RECLINE FOR 30 MINUTES AFTER DOSING 4 tablet 3    atorvastatin  (LIPITOR) 20 MG tablet TAKE 1 TABLET(20 MG) BY MOUTH DAILY 90 tablet 1    Blood Glucose Monitoring Suppl (ONE TOUCH ULTRA 2) w/Device KIT SMARTSIG:1 Each Via Meter As Directed      Blood Glucose Monitoring Suppl DEVI 1 each by Does not apply route in the morning, at noon, and at bedtime. May substitute to any manufacturer covered by patient's insurance. 1 each 0    chlorzoxazone (PARAFON) 500 MG tablet Take 1 tablet by mouth 3 (three) times daily as needed.      diclofenac Sodium (VOLTAREN) 1 % GEL Apply 4g 4 times a day by  topical route as directed.      Flavocoxid 500 MG CAPS Take by oral route twice per day.      fluticasone  (FLONASE ) 50 MCG/ACT nasal spray SHAKE LIQUID AND USE 2 SPRAYS IN EACH NOSTRIL DAILY 16 g 1    fluticasone -salmeterol (WIXELA INHUB) 250-50 MCG/ACT AEPB INHALE 1 PUFF INTO THE  LUNGS TWICE DAILY 60 each 0    gabapentin  (NEURONTIN ) 600 MG tablet Take 1 tablet (600 mg total) by mouth 3 (three) times daily. (Patient taking differently: Take 600 mg by mouth 2 (two) times daily. Patient takes 2 qam and 2 qpm) 90 tablet 5    glimepiride  (AMARYL ) 2 MG tablet Take 1 tablet (2 mg total) by mouth daily with breakfast. (Patient taking differently: Take 1 mg by mouth daily with breakfast.) 90 tablet 1    glucose blood (ONETOUCH ULTRA) test strip USE TO TEST BLOOD SUGAR TWICE DAILY 200 strip 1    JANUVIA  100 MG tablet TAKE 1 TABLET(100 MG) BY MOUTH DAILY 100 tablet 1    Lancets (ONETOUCH DELICA PLUS LANCET30G) MISC Use up to 4 times daily to test blood sugar as needed 100 each 12    metFORMIN  (GLUCOPHAGE -XR) 500 MG 24 hr tablet Take 2 tablets (1,000 mg total) by mouth 2 (two) times daily with a meal. 360 tablet 1    omeprazole  (PRILOSEC ) 20 MG capsule TAKE 1 CAPSULE(20 MG) BY MOUTH TWICE DAILY BEFORE A MEAL 180 capsule 0    oxyCODONE -acetaminophen  (PERCOCET) 10-325 MG tablet Take 1 tablet by mouth every 4 (four) hours as needed.  0    pramipexole (MIRAPEX) 0.125 MG tablet Mirapex 0.125 mg tablet  1 to 2 in pm for RLS      tiZANidine (ZANAFLEX) 4 MG tablet Take 4 mg by mouth every 6 (six) hours as needed.         Allergies  Allergen Reactions   Levofloxacin  Nausea And Vomiting   Robitussin Dm Max Day-Night     Dizziness, out of it   Vicks Nyquil Cold & Flu Night [Dm-Apap-Cpm]     Causes dizziness and out of it     Past Medical History:  Diagnosis Date   Arthritis    Diabetes mellitus without complication (HCC)    Diet controlled gestational diabetes mellitus in puerperium    Emphysema of lung (HCC)    Familial spastic paraparesis (HCC)    GERD (gastroesophageal reflux disease)    Hyperlipidemia    Mood disorder 04/29/2019    Review of systems:  Otherwise negative.    Physical Exam  Gen: Alert, oriented. Appears stated age.  HEENT: [place]/AT. PERRLA. Lungs:  CTA, no wheezes. CV: RR nl S1, S2. Abd: soft, benign, no masses. BS+ Ext: No edema. Pulses 2+    Planned procedures: Proceed with EGD. The patient understands the nature of the planned procedure, indications, risks, alternatives and potential complications including but not limited to bleeding, infection, perforation, damage to internal organs and possible oversedation/side effects from anesthesia. The patient agrees and gives consent to proceed.  Please refer to procedure notes for findings, recommendations and patient disposition/instructions.     Amna Welker K. Hannah [name], M.D. Gastroenterology 11/20/2023  9:16 AM

## 2023-11-20 NOTE — Anesthesia Postprocedure Evaluation (Signed)
 Anesthesia Post Note  Patient: Hannah [name]  Procedure(s) Performed: EGD (ESOPHAGOGASTRODUODENOSCOPY)  Patient location during evaluation: PACU Anesthesia Type: General Level of consciousness: awake and awake and alert Pain management: satisfactory to patient Vital Signs Assessment: post-procedure vital signs reviewed and stable Respiratory status: spontaneous breathing Cardiovascular status: stable Anesthetic complications: no   No notable events documented.   Last Vitals:  Vitals:   11/20/23 0941 11/20/23 0951  BP: (!) 147/83 120/63  Pulse: 81 77  Resp: 12 14  Temp: (!) 36 C   SpO2: 98% 99%    Last Pain:  Vitals:   11/20/23 0951  TempSrc:   PainSc: 0-No pain                 VAN STAVEREN,Averi Cacioppo

## 2023-11-20 NOTE — Anesthesia Preprocedure Evaluation (Signed)
 Anesthesia Evaluation  Patient identified by MRN, date of birth, ID band Patient awake    Reviewed: Allergy & Precautions, NPO status , Patient's Chart, lab work & pertinent test results  Airway Mallampati: II  TM Distance: >3 FB Neck ROM: full    Dental  (+) Missing, Poor Dentition, Chipped, Dental Advisory Given   Pulmonary neg pulmonary ROS, COPD, Patient abstained from smoking., former smoker   Pulmonary exam normal  + decreased breath sounds      Cardiovascular Exercise Tolerance: Good negative cardio ROS Normal cardiovascular exam Rhythm:Regular Rate:Normal     Neuro/Psych negative neurological ROS  negative psych ROS   GI/Hepatic negative GI ROS, Neg liver ROS,GERD  Medicated,,  Endo/Other  negative endocrine ROSdiabetes, Type 2, Oral Hypoglycemic Agents    Renal/GU negative Renal ROS  negative genitourinary   Musculoskeletal  (+) Arthritis ,    Abdominal   Peds negative pediatric ROS (+)  Hematology negative hematology ROS (+)   Anesthesia Other Findings Past Medical History: No date: Arthritis No date: Diabetes mellitus without complication (HCC) No date: Diet controlled gestational diabetes mellitus in puerperium No date: Emphysema of lung (HCC) No date: Familial spastic paraparesis (HCC) No date: GERD (gastroesophageal reflux disease) No date: Hyperlipidemia 04/29/2019: Mood disorder  Past Surgical History: 03/05/2016: COLONOSCOPY WITH PROPOFOL ; N/A     Comment:  Procedure: COLONOSCOPY WITH PROPOFOL ;  Surgeon: Rogelia Copping, MD;  Location: [facility];  Service:               Endoscopy;  Laterality: N/A; 09/18/2021: COLONOSCOPY WITH PROPOFOL ; N/A     Comment:  Procedure: COLONOSCOPY WITH PROPOFOL ;  Surgeon: Copping Rogelia, MD;  Location: [facility];  Service:               Endoscopy;  Laterality: N/A; 12/29/2021: ESOPHAGEAL DILATION; N/A     Comment:   Procedure: ESOPHAGEAL DILATION;  Surgeon: Copping Rogelia,               MD;  Location: [facility];  Service: Endoscopy;               Laterality: N/A; 12/29/2021: ESOPHAGOGASTRODUODENOSCOPY (EGD) WITH PROPOFOL ; N/A     Comment:  Procedure: ESOPHAGOGASTRODUODENOSCOPY (EGD) WITH               PROPOFOL ;  Surgeon: Copping Rogelia, MD;  Location: [facility]               SURGERY [facility];  Service: Endoscopy;  Laterality: N/A;                Diabetic 03/2013: HIP FRACTURE SURGERY; Right No date: INNER EAR SURGERY; Bilateral 02/2015: REPLACEMENT TOTAL KNEE; Right No date: SHOULDER SURGERY; Bilateral     Comment:  rotator cuff 06/05/2022: TOTAL KNEE ARTHROPLASTY; Left  BMI    Body Mass Index: 19.17 kg/m      Reproductive/Obstetrics negative OB ROS                              Anesthesia Physical Anesthesia Plan  ASA: 3  Anesthesia Plan: General   Post-op Pain Management:    Induction:   PONV Risk Score and Plan: Propofol  infusion and TIVA  Airway Management Planned: Natural Airway and Nasal Cannula  Additional Equipment:   Intra-op  Plan:   Post-operative Plan:   Informed Consent: I have reviewed the patients History and Physical, chart, labs and discussed the procedure including the risks, benefits and alternatives for the proposed anesthesia with the patient or authorized representative who has indicated his/her understanding and acceptance.     Dental advisory given  Plan Discussed with: CRNA  Anesthesia Plan Comments:         Anesthesia Quick Evaluation

## 2023-11-21 ENCOUNTER — Encounter: Payer: Self-pay | Admitting: Internal Medicine

## 2023-11-21 LAB — SURGICAL PATHOLOGY

## 2023-12-03 ENCOUNTER — Other Ambulatory Visit: Payer: Self-pay | Admitting: Internal Medicine

## 2023-12-03 DIAGNOSIS — J439 Emphysema, unspecified: Secondary | ICD-10-CM

## 2023-12-05 NOTE — Telephone Encounter (Signed)
 Requested Prescriptions  Pending Prescriptions Disp Refills   WIXELA INHUB 250-50 MCG/ACT AEPB [Pharmacy Med Name: WIXELA INHUB DISKUS 250/50MCG 60S] 60 each 0    Sig: INHALE 1 PUFF INTO THE LUNGS TWICE DAILY     Pulmonology:  Combination Products Passed - 12/05/2023 11:28 AM      Passed - Valid encounter within last 12 months    Recent Outpatient Visits           1 month ago Type II diabetes mellitus with complication [facility])   [place] [facility] at [facility], Leita DEL, MD   5 months ago Annual physical exam   [facility] at [facility], Leita DEL, MD   7 months ago Acute exacerbation of chronic obstructive airways disease [facility])   [place] [facility] at [facility], Leita DEL, MD

## 2023-12-11 ENCOUNTER — Other Ambulatory Visit: Payer: Self-pay | Admitting: Internal Medicine

## 2023-12-11 DIAGNOSIS — E118 Type 2 diabetes mellitus with unspecified complications: Secondary | ICD-10-CM

## 2023-12-12 DIAGNOSIS — G894 Chronic pain syndrome: Secondary | ICD-10-CM | POA: Diagnosis not present

## 2023-12-12 DIAGNOSIS — M47812 Spondylosis without myelopathy or radiculopathy, cervical region: Secondary | ICD-10-CM | POA: Diagnosis not present

## 2023-12-12 DIAGNOSIS — M79642 Pain in left hand: Secondary | ICD-10-CM | POA: Diagnosis not present

## 2023-12-12 DIAGNOSIS — G8918 Other acute postprocedural pain: Secondary | ICD-10-CM | POA: Diagnosis not present

## 2023-12-12 DIAGNOSIS — R202 Paresthesia of skin: Secondary | ICD-10-CM | POA: Diagnosis not present

## 2023-12-12 DIAGNOSIS — M791 Myalgia, unspecified site: Secondary | ICD-10-CM | POA: Diagnosis not present

## 2023-12-12 DIAGNOSIS — G2581 Restless legs syndrome: Secondary | ICD-10-CM | POA: Diagnosis not present

## 2023-12-12 DIAGNOSIS — E1140 Type 2 diabetes mellitus with diabetic neuropathy, unspecified: Secondary | ICD-10-CM | POA: Diagnosis not present

## 2023-12-12 DIAGNOSIS — M4802 Spinal stenosis, cervical region: Secondary | ICD-10-CM | POA: Diagnosis not present

## 2023-12-12 DIAGNOSIS — M47816 Spondylosis without myelopathy or radiculopathy, lumbar region: Secondary | ICD-10-CM | POA: Diagnosis not present

## 2023-12-12 DIAGNOSIS — Z79899 Other long term (current) drug therapy: Secondary | ICD-10-CM | POA: Diagnosis not present

## 2023-12-12 DIAGNOSIS — M48061 Spinal stenosis, lumbar region without neurogenic claudication: Secondary | ICD-10-CM | POA: Diagnosis not present

## 2023-12-13 NOTE — Telephone Encounter (Signed)
 Requested Prescriptions  Pending Prescriptions Disp Refills   glucose blood (ONETOUCH ULTRA) test strip [Pharmacy Med Name: ONE TOUCH ULTRA BLUE TESTST(NEW)100] 200 strip 0    Sig: USE TO TEST BLOOD SUGAR TWICE DAILY     Endocrinology: Diabetes - Testing Supplies Passed - 12/13/2023 10:27 AM      Passed - Valid encounter within last 12 months    Recent Outpatient Visits           1 month ago Type II diabetes mellitus with complication [facility])   [place] [facility] at [facility], Leita DEL, MD   5 months ago Annual physical exam   [facility] at [facility], Leita DEL, MD   7 months ago Acute exacerbation of chronic obstructive airways disease [facility])   [place] [facility] at [facility], Leita DEL, MD

## 2024-01-02 ENCOUNTER — Other Ambulatory Visit: Payer: Self-pay | Admitting: Internal Medicine

## 2024-01-02 DIAGNOSIS — J439 Emphysema, unspecified: Secondary | ICD-10-CM

## 2024-01-03 NOTE — Telephone Encounter (Signed)
 Requested Prescriptions  Pending Prescriptions Disp Refills   fluticasone -salmeterol (ADVAIR) 250-50 MCG/ACT AEPB [Pharmacy Med Name: FLUTICASONE /SALM DISK 250/50MCG 60S] 180 each 1    Sig: INHALE 1 PUFF INTO THE LUNGS TWICE DAILY     Pulmonology:  Combination Products Passed - 01/03/2024  4:19 PM      Passed - Valid encounter within last 12 months    Recent Outpatient Visits           1 month ago Type II diabetes mellitus with complication [facility])   [place] [facility] at [facility], Leita DEL, MD   6 months ago Annual physical exam   [facility] at [facility], Leita DEL, MD   8 months ago Acute exacerbation of chronic obstructive airways disease [facility])   [place] [facility] at [facility], Leita DEL, MD

## 2024-01-06 ENCOUNTER — Other Ambulatory Visit: Payer: Self-pay | Admitting: Internal Medicine

## 2024-01-06 DIAGNOSIS — R1310 Dysphagia, unspecified: Secondary | ICD-10-CM

## 2024-01-07 NOTE — Telephone Encounter (Signed)
 Requested Prescriptions  Pending Prescriptions Disp Refills   omeprazole  (PRILOSEC ) 20 MG capsule [Pharmacy Med Name: OMEPRAZOLE  20MG  CAPSULES] 180 capsule 0    Sig: TAKE 1 CAPSULE(20 MG) BY MOUTH TWICE DAILY BEFORE A MEAL     Gastroenterology: Proton Pump Inhibitors Passed - 01/07/2024  5:45 PM      Passed - Valid encounter within last 12 months    Recent Outpatient Visits           2 months ago Type II diabetes mellitus with complication [facility]   [place] [facility] at [facility], Leita DEL, MD   6 months ago Annual physical exam   [facility] at [facility], Leita DEL, MD   8 months ago Acute exacerbation of chronic obstructive airways disease [facility])   [place] [facility] at [facility], Leita DEL, MD

## 2024-01-20 DIAGNOSIS — M47817 Spondylosis without myelopathy or radiculopathy, lumbosacral region: Secondary | ICD-10-CM | POA: Diagnosis not present

## 2024-01-20 DIAGNOSIS — E119 Type 2 diabetes mellitus without complications: Secondary | ICD-10-CM | POA: Diagnosis not present

## 2024-02-01 ENCOUNTER — Other Ambulatory Visit: Payer: Self-pay | Admitting: Internal Medicine

## 2024-02-01 DIAGNOSIS — E1169 Type 2 diabetes mellitus with other specified complication: Secondary | ICD-10-CM

## 2024-02-04 ENCOUNTER — Encounter: Payer: Self-pay | Admitting: Internal Medicine

## 2024-02-04 ENCOUNTER — Ambulatory Visit: Admitting: Internal Medicine

## 2024-02-04 VITALS — BP 142/72 | HR 79 | Temp 98.4°F | Ht 65.0 in | Wt 116.0 lb

## 2024-02-04 DIAGNOSIS — Z7984 Long term (current) use of oral hypoglycemic drugs: Secondary | ICD-10-CM | POA: Diagnosis not present

## 2024-02-04 DIAGNOSIS — G114 Hereditary spastic paraplegia: Secondary | ICD-10-CM | POA: Diagnosis not present

## 2024-02-04 DIAGNOSIS — E118 Type 2 diabetes mellitus with unspecified complications: Secondary | ICD-10-CM | POA: Diagnosis not present

## 2024-02-04 DIAGNOSIS — M818 Other osteoporosis without current pathological fracture: Secondary | ICD-10-CM

## 2024-02-04 DIAGNOSIS — R1319 Other dysphagia: Secondary | ICD-10-CM

## 2024-02-04 DIAGNOSIS — J441 Chronic obstructive pulmonary disease with (acute) exacerbation: Secondary | ICD-10-CM

## 2024-02-04 LAB — POCT GLYCOSYLATED HEMOGLOBIN (HGB A1C): Hemoglobin A1C: 7.2 % — AB (ref 4.0–5.6)

## 2024-02-04 MED ORDER — ALBUTEROL SULFATE HFA 108 (90 BASE) MCG/ACT IN AERS: 2.0000 | INHALATION_SPRAY | RESPIRATORY_TRACT | 5 refills | Status: AC | PRN

## 2024-02-04 MED ORDER — PREDNISONE 10 MG PO TABS: ORAL_TABLET | ORAL | 0 refills | Status: AC

## 2024-02-04 MED ORDER — METFORMIN HCL ER 500 MG PO TB24: 1000.0000 mg | ORAL_TABLET | Freq: Two times a day (BID) | ORAL | 1 refills | Status: AC

## 2024-02-04 MED ORDER — DOXYCYCLINE HYCLATE 100 MG PO TABS: 100.0000 mg | ORAL_TABLET | Freq: Two times a day (BID) | ORAL | 0 refills | Status: AC

## 2024-02-04 MED ORDER — BENZONATATE 100 MG PO CAPS: 100.0000 mg | ORAL_CAPSULE | Freq: Three times a day (TID) | ORAL | 0 refills | Status: AC

## 2024-02-04 MED ORDER — GLIMEPIRIDE 2 MG PO TABS: 1.0000 mg | ORAL_TABLET | Freq: Every day | ORAL | 1 refills | Status: AC

## 2024-02-04 MED ORDER — ALENDRONATE SODIUM 70 MG PO TABS: 70.0000 mg | ORAL_TABLET | ORAL | 3 refills | Status: AC

## 2024-02-04 NOTE — Assessment & Plan Note (Addendum)
 Currently medications are amaryl , MTF and Jardinace.  No hypoglycemic episodes noted. One BS was 56. Home blood sugars in the 110 range. Last visit medical regimen changes were none.  She cut the Amaryl  to 1 mg daily. Lab Results  Component Value Date   HGBA1C 5.2 11/05/2023   A1C today = 7.2 Will continue the same regimen.

## 2024-02-04 NOTE — Progress Notes (Signed)
 Date:  02/04/2024   Name:  Hannah [name]   DOB:  07-23-1955   MRN:  [id number]   Chief Complaint: Diabetes and Cough (~3w, productive, colored mucus, possible fevers/chills, loss of appetite)  Diabetes She presents for her follow-up diabetic visit. She has type 2 diabetes mellitus. Her disease course has been stable. Pertinent negatives for hypoglycemia include no dizziness or headaches. Associated symptoms include fatigue. Pertinent negatives for diabetes include no chest pain. Current diabetic treatments: Amaryl , januvia , MTF.  Cough This is a new problem. The current episode started 1 to 4 weeks ago. The problem occurs every few minutes. The cough is Productive of sputum. Associated symptoms include shortness of breath and wheezing. Pertinent negatives include no chest pain, chills, fever or headaches.    Review of Systems  Constitutional:  Positive for fatigue. Negative for chills and fever.  Respiratory:  Positive for cough, chest tightness, shortness of breath and wheezing.   Cardiovascular:  Negative for chest pain, palpitations and leg swelling.  Neurological:  Negative for dizziness, light-headedness and headaches.     Lab Results  Component Value Date   NA 141 07/05/2023   K 4.3 07/05/2023   CO2 23 07/05/2023   GLUCOSE 91 07/05/2023   BUN 7 (L) 07/05/2023   CREATININE 0.66 07/05/2023   CALCIUM  9.7 07/05/2023   EGFR 96 07/05/2023   GFRNONAA 98 11/25/2019   Lab Results  Component Value Date   CHOL 183 07/05/2023   HDL 64 07/05/2023   LDLCALC 96 07/05/2023   TRIG 131 07/05/2023   CHOLHDL 2.9 07/05/2023   Lab Results  Component Value Date   TSH 3.280 07/05/2023   Lab Results  Component Value Date   HGBA1C 7.2 (A) 02/04/2024   Lab Results  Component Value Date   WBC 7.9 07/05/2023   HGB 15.1 07/05/2023   HCT 46.6 07/05/2023   MCV 95 07/05/2023   PLT 239 07/05/2023   Lab Results  Component Value Date   ALT 13 07/05/2023   AST 14 07/05/2023    ALKPHOS 91 07/05/2023   BILITOT 0.3 07/05/2023   No results found for: MARIEN BOLLS, VD25OH   Patient Active Problem List   Diagnosis Date Noted   Asthma 11/13/2023   Heart murmur 11/13/2023   Multiple sclerosis 11/13/2023   Cervical spondylosis without myelopathy 06/27/2023   History of total right knee replacement 11/06/2022   Bone marrow edema 08/30/2022   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   History of colonic polyps    Benign neoplasm of descending colon    Other osteoporosis without current pathological fracture 11/25/2015   Hyperlipidemia associated with type 2 diabetes mellitus (HCC) 03/28/2015   Dysphagia 11/11/2014   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   Type II diabetes mellitus with complication (HCC) 11/11/2014   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   Tobacco use disorder, mild, 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   Degenerative joint disease involving multiple joints 09/28/2013    Allergies[1]  Past Surgical History:  Procedure Laterality Date   COLONOSCOPY WITH PROPOFOL  N/A 03/05/2016   Procedure: COLONOSCOPY WITH PROPOFOL ;  Surgeon: Rogelia Copping, MD;  Location: [facility];  Service: Endoscopy;  Laterality: N/A;   COLONOSCOPY WITH PROPOFOL  N/A 09/18/2021   Procedure: COLONOSCOPY WITH PROPOFOL ;  Surgeon: Copping Rogelia, MD;  Location: [facility];  Service: Endoscopy;  Laterality: N/A;   ESOPHAGEAL DILATION N/A 12/29/2021   Procedure: ESOPHAGEAL DILATION;  Surgeon: Copping Rogelia, MD;  Location: [facility];  Service: Endoscopy;  Laterality: N/A;   ESOPHAGOGASTRODUODENOSCOPY N/A 11/20/2023   Procedure: EGD (ESOPHAGOGASTRODUODENOSCOPY);  Surgeon: Toledo, Ladell POUR, MD;  Location: [facility];  Service: Gastroenterology;  Laterality: N/A;  DM   ESOPHAGOGASTRODUODENOSCOPY (EGD) WITH PROPOFOL  N/A 12/29/2021   Procedure: ESOPHAGOGASTRODUODENOSCOPY (EGD)  WITH PROPOFOL ;  Surgeon: Jinny Carmine, MD;  Location: [facility];  Service: Endoscopy;  Laterality: N/A;  Diabetic   HIP FRACTURE SURGERY Right 03/2013   INNER EAR SURGERY Bilateral    MALONEY DILATION  11/20/2023   Procedure: DILATION, ESOPHAGUS, USING MALONEY DILATOR;  Surgeon: Aundria, Ladell POUR, MD;  Location: [facility];  Service: Gastroenterology;;   REPLACEMENT TOTAL KNEE Right 02/2015   SHOULDER SURGERY Bilateral    rotator cuff   TOTAL KNEE ARTHROPLASTY Left 06/05/2022    Social History[2]   Medication list has been reviewed and updated.  Active Medications[3]     11/05/2023    9:58 AM 07/05/2023    9:39 AM 03/04/2023    1:52 PM 10/31/2022    1:12 PM  GAD 7 : Generalized Anxiety Score  Nervous, Anxious, on Edge 1 0 0 0  Control/stop worrying 1 0 0 0  Worry too much - different things 1 0 0 0  Trouble relaxing 0 1 0 0  Restless 0 0 0 0  Easily annoyed or irritable 3 1 0 0  Afraid - awful might happen 0 0 0 0  Total GAD 7 Score 6 2 0 0  Anxiety Difficulty Somewhat difficult Somewhat difficult Not difficult at all Not difficult at all       11/07/2023    2:28 PM 11/05/2023    9:58 AM 07/05/2023    9:39 AM  Depression screen PHQ 2/9  Decreased Interest 0 0 1  Down, Depressed, Hopeless 0 0 0  PHQ - 2 Score 0 0 1  Altered sleeping 0  1  Tired, decreased energy 0  1  Change in appetite 1  1  Feeling bad or failure about yourself  0  0  Trouble concentrating 0  0  Moving slowly or fidgety/restless 0  0  Suicidal thoughts 0  0  PHQ-9 Score 1   4   Difficult doing work/chores Not difficult at all  Somewhat difficult     Data saved with a previous flowsheet row definition    BP Readings from Last 3 Encounters:  02/04/24 (!) 142/72  11/20/23 (!) 146/66  11/05/23 120/76    Physical Exam Constitutional:      Appearance: She is ill-appearing.  Neck:     Vascular: No carotid bruit.  Cardiovascular:     Rate and Rhythm: Normal rate and regular rhythm.   Pulmonary:     Effort: Pulmonary effort is normal.     Breath sounds: Transmitted upper airway sounds present. Examination of the right-upper field reveals decreased breath sounds and wheezing. Examination of the left-upper field reveals decreased breath sounds and wheezing. Decreased breath sounds and wheezing present.  Musculoskeletal:     Cervical back: Normal range of motion.  Lymphadenopathy:     Cervical: No cervical adenopathy.  Neurological:     Mental Status: She is alert.     Wt Readings from Last 3 Encounters:  02/04/24 116 lb (52.6 kg)  11/20/23 115 lb 3.2 oz (52.3 kg)  11/07/23 114 lb (51.7 kg)    BP (!) 142/72   Pulse 79   Temp 98.4 F (36.9 C) (Oral)   Ht 5' 5 (  1.651 m)   Wt 116 lb (52.6 kg)   SpO2 92%   BMI 19.30 kg/m   Assessment and Plan:  Problem List Items Addressed This Visit       Unprioritized   Dysphagia (Chronic)   S/p EGD and dilatation.  Able to eat much better until she lost her appetite with current URI      Autosomal dominant hereditary spastic paraplegia (HCC)   Stable ambulation impairment. Walks with rolator.  No recent falls. She will follow up with [facility] as needed      Type II diabetes mellitus with complication (HCC) - Primary (Chronic)   Currently medications are amaryl , MTF and Jardinace.  No hypoglycemic episodes noted. One BS was 56. Home blood sugars in the 110 range. Last visit medical regimen changes were none.  She cut the Amaryl  to 1 mg daily. Lab Results  Component Value Date   HGBA1C 5.2 11/05/2023   A1C today = 7.2 Will continue the same regimen.       Relevant Medications   glimepiride  (AMARYL ) 2 MG tablet   metFORMIN  (GLUCOPHAGE -XR) 500 MG 24 hr tablet   Other Relevant Orders   POCT HgB A1C (Completed)   Other osteoporosis without current pathological fracture (Chronic)   Relevant Medications   alendronate  (FOSAMAX ) 70 MG tablet   Other Visit Diagnoses       Acute exacerbation of chronic  obstructive airways disease (HCC)       Relevant Medications   predniSONE  (DELTASONE ) 10 MG tablet   doxycycline  (VIBRA -TABS) 100 MG tablet   benzonatate  (TESSALON ) 100 MG capsule   albuterol  (VENTOLIN  HFA) 108 (90 Base) MCG/ACT inhaler     Long term current use of oral hypoglycemic drug           Return in about 5 months (around 07/04/2024) for CPX.    Leita HILARIO Adie, MD [facility] [place]           [1]  Allergies Allergen Reactions   Levofloxacin  Nausea And Vomiting   Robitussin Dm Max Day-Night     Dizziness, out of it   Vicks Nyquil Cold & Flu Night [Dm-Apap-Cpm]     Causes dizziness and out of it  [2]  Social History Tobacco Use   Smoking status: Former    Current packs/day: 0.00    Average packs/day: 0.5 packs/day for 25.0 years (12.5 ttl pk-yrs)    Types: Cigarettes    Start date: 03/06/1991    Quit date: 03/05/2016    Years since quitting: 7.9   Smokeless tobacco: Never  Vaping Use   Vaping status: Never Used  Substance Use Topics   Alcohol use: No   Drug use: No  [3]  Current Meds  Medication Sig   atorvastatin  (LIPITOR) 20 MG tablet TAKE 1 TABLET(20 MG) BY MOUTH DAILY   benzonatate  (TESSALON ) 100 MG capsule Take 1 capsule (100 mg total) by mouth 3 (three) times daily for 10 days.   Blood Glucose Monitoring Suppl (ONE TOUCH ULTRA 2) w/Device KIT SMARTSIG:1 Each Via Meter As Directed   Blood Glucose Monitoring Suppl DEVI 1 each by Does not apply route in the morning, at noon, and at bedtime. May substitute to any manufacturer covered by patient's insurance.   chlorzoxazone (PARAFON) 500 MG tablet Take 1 tablet by mouth 3 (three) times daily as needed.   diclofenac Sodium (VOLTAREN) 1 % GEL Apply 4g 4 times a day by topical route as directed.  doxycycline  (VIBRA -TABS) 100 MG tablet Take 1 tablet (100 mg total) by mouth 2 (two) times daily for 10 days.   Flavocoxid 500 MG CAPS Take by oral route twice per day.    fluticasone  (FLONASE ) 50 MCG/ACT nasal spray SHAKE LIQUID AND USE 2 SPRAYS IN EACH NOSTRIL DAILY   fluticasone -salmeterol (ADVAIR) 250-50 MCG/ACT AEPB INHALE 1 PUFF INTO THE LUNGS TWICE DAILY   gabapentin  (NEURONTIN ) 600 MG tablet Take 1 tablet (600 mg total) by mouth 3 (three) times daily. (Patient taking differently: Take 600 mg by mouth 2 (two) times daily. Patient takes 2 qam and 2 qpm)   glucose blood (ONETOUCH ULTRA) test strip USE TO TEST BLOOD SUGAR TWICE DAILY   JANUVIA  100 MG tablet TAKE 1 TABLET(100 MG) BY MOUTH DAILY   Lancets (ONETOUCH DELICA PLUS LANCET30G) MISC Use up to 4 times daily to test blood sugar as needed   omeprazole  (PRILOSEC ) 20 MG capsule TAKE 1 CAPSULE(20 MG) BY MOUTH TWICE DAILY BEFORE A MEAL   oxyCODONE -acetaminophen  (PERCOCET) 10-325 MG tablet Take 1 tablet by mouth every 4 (four) hours as needed.   pramipexole (MIRAPEX) 0.125 MG tablet Mirapex 0.125 mg tablet  1 to 2 in pm for RLS   predniSONE  (DELTASONE ) 10 MG tablet Take 6 tablets (60 mg total) by mouth daily with breakfast for 2 days, THEN 5 tablets (50 mg total) daily with breakfast for 2 days, THEN 4 tablets (40 mg total) daily with breakfast for 2 days, THEN 3 tablets (30 mg total) daily with breakfast for 2 days, THEN 2 tablets (20 mg total) daily with breakfast for 2 days, THEN 1 tablet (10 mg total) daily with breakfast for 2 days.   tiZANidine (ZANAFLEX) 4 MG tablet Take 4 mg by mouth every 6 (six) hours as needed.    [DISCONTINUED] albuterol  (VENTOLIN  HFA) 108 (90 Base) MCG/ACT inhaler Inhale 2 puffs into the lungs every 4 (four) hours as needed for wheezing or shortness of breath.   [DISCONTINUED] alendronate  (FOSAMAX ) 70 MG tablet TAKE 1 TABLET BY MOUTH EVERY 7 DAYS WITH A FULL GLASS OF WATER  AND ON AN EMPTY STOMACH. DO NOT EAT OR DRINK OR RECLINE FOR 30 MINUTES AFTER DOSING   [DISCONTINUED] glimepiride  (AMARYL ) 2 MG tablet Take 1 tablet (2 mg total) by mouth daily with breakfast. (Patient taking  differently: Take 1 mg by mouth daily with breakfast.)   [DISCONTINUED] metFORMIN  (GLUCOPHAGE -XR) 500 MG 24 hr tablet Take 2 tablets (1,000 mg total) by mouth 2 (two) times daily with a meal.

## 2024-02-04 NOTE — Telephone Encounter (Signed)
 Rx 11/07/23 #90 1RF- too soon Requested Prescriptions  Pending Prescriptions Disp Refills   atorvastatin  (LIPITOR) 20 MG tablet [Pharmacy Med Name: ATORVASTATIN  20MG  TABLETS] 90 tablet 1    Sig: TAKE 1 TABLET(20 MG) BY MOUTH DAILY     Cardiovascular:  Antilipid - Statins Failed - 02/04/2024 10:54 AM      Failed - Lipid Panel in normal range within the last 12 months    Cholesterol, Total  Date Value Ref Range Status  07/05/2023 183 100 - 199 mg/dL Final   LDL Chol Calc (NIH)  Date Value Ref Range Status  07/05/2023 96 0 - 99 mg/dL Final   HDL  Date Value Ref Range Status  07/05/2023 64 >39 mg/dL Final   Triglycerides  Date Value Ref Range Status  07/05/2023 131 0 - 149 mg/dL Final         Passed - Patient is not pregnant      Passed - Valid encounter within last 12 months    Recent Outpatient Visits           3 months ago Type II diabetes mellitus with complication (HCC)   [place] [facility] at [facility], Leita DEL, MD   7 months ago Annual physical exam   [facility] at [facility], Leita DEL, MD   9 months ago Acute exacerbation of chronic obstructive airways disease [facility])   [place] [facility] at [facility], Leita DEL, MD

## 2024-02-04 NOTE — Assessment & Plan Note (Signed)
 S/p EGD and dilatation.  Able to eat much better until she lost her appetite with current URI

## 2024-02-04 NOTE — Assessment & Plan Note (Signed)
 Stable ambulation impairment. Walks with rolator.  No recent falls. She will follow up with [facility] as needed

## 2024-07-06 ENCOUNTER — Encounter: Admitting: Student

## 2024-11-11 ENCOUNTER — Ambulatory Visit
